# Patient Record
Sex: Male | Born: 1950
Health system: Southern US, Community
[De-identification: ages and names within clinical notes are randomized; demographics above are authoritative.]

## PROBLEM LIST (undated history)

## (undated) DIAGNOSIS — I739 Peripheral vascular disease, unspecified: Secondary | ICD-10-CM

## (undated) DIAGNOSIS — M1990 Unspecified osteoarthritis, unspecified site: Secondary | ICD-10-CM

## (undated) DIAGNOSIS — J690 Pneumonitis due to inhalation of food and vomit: Secondary | ICD-10-CM

## (undated) DIAGNOSIS — K219 Gastro-esophageal reflux disease without esophagitis: Secondary | ICD-10-CM

## (undated) DIAGNOSIS — J189 Pneumonia, unspecified organism: Secondary | ICD-10-CM

## (undated) DIAGNOSIS — E785 Hyperlipidemia, unspecified: Secondary | ICD-10-CM

## (undated) DIAGNOSIS — K759 Inflammatory liver disease, unspecified: Secondary | ICD-10-CM

## (undated) DIAGNOSIS — I10 Essential (primary) hypertension: Secondary | ICD-10-CM

## (undated) DIAGNOSIS — G4730 Sleep apnea, unspecified: Secondary | ICD-10-CM

## (undated) DIAGNOSIS — K851 Biliary acute pancreatitis without necrosis or infection: Secondary | ICD-10-CM

## (undated) HISTORY — DX: Biliary acute pancreatitis without necrosis or infection: K85.10

## (undated) HISTORY — PX: TUBAL LIGATION: SHX77

## (undated) HISTORY — DX: Unspecified osteoarthritis, unspecified site: M19.90

## (undated) HISTORY — PX: ELBOW SURGERY: SHX618

## (undated) HISTORY — DX: Essential (primary) hypertension: I10

## (undated) HISTORY — PX: COLON SURGERY: SHX602

## (undated) HISTORY — DX: Sleep apnea, unspecified: G47.30

## (undated) HISTORY — DX: Pneumonitis due to inhalation of food and vomit: J69.0

## (undated) HISTORY — DX: Inflammatory liver disease, unspecified: K75.9

## (undated) HISTORY — PX: BACK SURGERY: SHX140

## (undated) HISTORY — DX: Peripheral vascular disease, unspecified: I73.9

## (undated) HISTORY — PX: CHOLECYSTECTOMY: SHX55

## (undated) HISTORY — DX: Hyperlipidemia, unspecified: E78.5

## (undated) HISTORY — DX: Pneumonia, unspecified organism: J18.9

## (undated) HISTORY — DX: Gastro-esophageal reflux disease without esophagitis: K21.9

## (undated) HISTORY — PX: SHOULDER SURGERY: SHX246

---

## 1999-04-23 ENCOUNTER — Ambulatory Visit (HOSPITAL_COMMUNITY): Admission: RE | Admit: 1999-04-23 | Discharge: 1999-04-23 | Payer: Self-pay | Admitting: Neurosurgery

## 1999-04-23 ENCOUNTER — Encounter: Payer: Self-pay | Admitting: Neurosurgery

## 1999-06-14 ENCOUNTER — Emergency Department (HOSPITAL_COMMUNITY): Admission: EM | Admit: 1999-06-14 | Discharge: 1999-06-14 | Payer: Self-pay | Admitting: Emergency Medicine

## 2000-02-06 ENCOUNTER — Encounter: Payer: Self-pay | Admitting: Neurosurgery

## 2000-02-11 ENCOUNTER — Inpatient Hospital Stay (HOSPITAL_COMMUNITY): Admission: RE | Admit: 2000-02-11 | Discharge: 2000-02-13 | Payer: Self-pay | Admitting: Neurosurgery

## 2000-02-11 ENCOUNTER — Encounter: Payer: Self-pay | Admitting: Neurosurgery

## 2000-03-30 ENCOUNTER — Ambulatory Visit (HOSPITAL_COMMUNITY): Admission: RE | Admit: 2000-03-30 | Discharge: 2000-03-30 | Payer: Self-pay | Admitting: Neurosurgery

## 2000-03-30 ENCOUNTER — Encounter: Payer: Self-pay | Admitting: Neurosurgery

## 2000-06-03 ENCOUNTER — Ambulatory Visit (HOSPITAL_COMMUNITY): Admission: RE | Admit: 2000-06-03 | Discharge: 2000-06-03 | Payer: Self-pay | Admitting: Neurosurgery

## 2000-06-03 ENCOUNTER — Encounter: Payer: Self-pay | Admitting: Neurosurgery

## 2000-06-11 ENCOUNTER — Encounter: Payer: Self-pay | Admitting: Neurosurgery

## 2000-06-11 ENCOUNTER — Ambulatory Visit (HOSPITAL_COMMUNITY): Admission: RE | Admit: 2000-06-11 | Discharge: 2000-06-11 | Payer: Self-pay | Admitting: Neurosurgery

## 2000-06-24 ENCOUNTER — Encounter: Admission: RE | Admit: 2000-06-24 | Discharge: 2000-06-24 | Payer: Self-pay | Admitting: Neurosurgery

## 2000-06-24 ENCOUNTER — Encounter: Payer: Self-pay | Admitting: Neurosurgery

## 2000-08-04 ENCOUNTER — Encounter: Payer: Self-pay | Admitting: Rheumatology

## 2000-08-04 ENCOUNTER — Encounter: Admission: RE | Admit: 2000-08-04 | Discharge: 2000-08-04 | Payer: Self-pay | Admitting: Rheumatology

## 2000-08-14 ENCOUNTER — Encounter: Admission: RE | Admit: 2000-08-14 | Discharge: 2000-11-12 | Payer: Self-pay | Admitting: Anesthesiology

## 2000-11-10 ENCOUNTER — Encounter: Payer: Self-pay | Admitting: Family Medicine

## 2002-02-04 ENCOUNTER — Encounter: Payer: Self-pay | Admitting: Neurosurgery

## 2002-02-04 ENCOUNTER — Ambulatory Visit (HOSPITAL_COMMUNITY): Admission: RE | Admit: 2002-02-04 | Discharge: 2002-02-04 | Payer: Self-pay | Admitting: Neurosurgery

## 2002-02-11 ENCOUNTER — Encounter: Admission: RE | Admit: 2002-02-11 | Discharge: 2002-02-11 | Payer: Self-pay | Admitting: Neurosurgery

## 2002-02-11 ENCOUNTER — Encounter: Payer: Self-pay | Admitting: Neurosurgery

## 2002-03-01 ENCOUNTER — Encounter: Payer: Self-pay | Admitting: Neurosurgery

## 2002-03-02 ENCOUNTER — Inpatient Hospital Stay (HOSPITAL_COMMUNITY): Admission: RE | Admit: 2002-03-02 | Discharge: 2002-03-04 | Payer: Self-pay | Admitting: Neurosurgery

## 2002-03-02 ENCOUNTER — Encounter: Payer: Self-pay | Admitting: Neurosurgery

## 2002-07-11 ENCOUNTER — Encounter: Admission: RE | Admit: 2002-07-11 | Discharge: 2002-07-11 | Payer: Self-pay | Admitting: Neurosurgery

## 2002-07-11 ENCOUNTER — Encounter: Payer: Self-pay | Admitting: Neurosurgery

## 2002-12-11 ENCOUNTER — Encounter: Payer: Self-pay | Admitting: Family Medicine

## 2002-12-11 ENCOUNTER — Encounter: Admission: RE | Admit: 2002-12-11 | Discharge: 2002-12-11 | Payer: Self-pay | Admitting: Family Medicine

## 2003-04-12 ENCOUNTER — Encounter: Payer: Self-pay | Admitting: Family Medicine

## 2003-06-30 ENCOUNTER — Encounter: Admission: RE | Admit: 2003-06-30 | Discharge: 2003-06-30 | Payer: Self-pay | Admitting: Neurosurgery

## 2003-10-21 ENCOUNTER — Encounter: Payer: Self-pay | Admitting: Family Medicine

## 2004-06-10 ENCOUNTER — Ambulatory Visit (HOSPITAL_COMMUNITY): Admission: RE | Admit: 2004-06-10 | Discharge: 2004-06-10 | Payer: Self-pay | Admitting: Orthopedic Surgery

## 2005-12-13 ENCOUNTER — Encounter: Payer: Self-pay | Admitting: Family Medicine

## 2006-10-07 ENCOUNTER — Emergency Department (HOSPITAL_COMMUNITY): Admission: EM | Admit: 2006-10-07 | Discharge: 2006-10-07 | Payer: Self-pay | Admitting: Emergency Medicine

## 2007-05-13 ENCOUNTER — Encounter: Payer: Self-pay | Admitting: Family Medicine

## 2008-01-25 ENCOUNTER — Encounter: Payer: Self-pay | Admitting: Family Medicine

## 2008-01-25 ENCOUNTER — Encounter: Admission: RE | Admit: 2008-01-25 | Discharge: 2008-01-25 | Payer: Self-pay | Admitting: Anesthesiology

## 2008-08-28 ENCOUNTER — Inpatient Hospital Stay (HOSPITAL_COMMUNITY): Admission: EM | Admit: 2008-08-28 | Discharge: 2008-09-17 | Payer: Self-pay | Admitting: Emergency Medicine

## 2008-08-28 ENCOUNTER — Ambulatory Visit: Payer: Self-pay | Admitting: Internal Medicine

## 2008-08-31 ENCOUNTER — Encounter (INDEPENDENT_AMBULATORY_CARE_PROVIDER_SITE_OTHER): Payer: Self-pay | Admitting: Internal Medicine

## 2008-09-04 ENCOUNTER — Encounter (INDEPENDENT_AMBULATORY_CARE_PROVIDER_SITE_OTHER): Payer: Self-pay | Admitting: Internal Medicine

## 2008-09-04 ENCOUNTER — Ambulatory Visit: Payer: Self-pay | Admitting: *Deleted

## 2008-09-29 ENCOUNTER — Ambulatory Visit: Payer: Self-pay | Admitting: Family Medicine

## 2008-09-29 DIAGNOSIS — K859 Acute pancreatitis without necrosis or infection, unspecified: Secondary | ICD-10-CM | POA: Insufficient documentation

## 2008-09-29 DIAGNOSIS — B356 Tinea cruris: Secondary | ICD-10-CM | POA: Insufficient documentation

## 2008-09-29 DIAGNOSIS — D509 Iron deficiency anemia, unspecified: Secondary | ICD-10-CM | POA: Insufficient documentation

## 2008-09-29 DIAGNOSIS — Z862 Personal history of diseases of the blood and blood-forming organs and certain disorders involving the immune mechanism: Secondary | ICD-10-CM | POA: Insufficient documentation

## 2008-09-29 DIAGNOSIS — I10 Essential (primary) hypertension: Secondary | ICD-10-CM | POA: Insufficient documentation

## 2008-10-02 ENCOUNTER — Encounter: Payer: Self-pay | Admitting: Family Medicine

## 2008-10-02 LAB — CONVERTED CEMR LAB
AST: 11 units/L (ref 0–37)
BUN: 15 mg/dL (ref 6–23)
Basophils Absolute: 0 10*3/uL (ref 0.0–0.1)
Bilirubin, Direct: 0.1 mg/dL (ref 0.0–0.3)
Eosinophils Relative: 0 % (ref 0–5)
Lymphocytes Relative: 17 % (ref 12–46)
Neutro Abs: 10.5 10*3/uL — ABNORMAL HIGH (ref 1.7–7.7)
Platelets: 435 10*3/uL — ABNORMAL HIGH (ref 150–400)
Potassium: 4 meq/L (ref 3.5–5.3)
RDW: 13.2 % (ref 11.5–15.5)
Sodium: 138 meq/L (ref 135–145)
Total Bilirubin: 0.4 mg/dL (ref 0.3–1.2)

## 2008-10-10 ENCOUNTER — Telehealth: Payer: Self-pay | Admitting: Family Medicine

## 2008-10-18 ENCOUNTER — Encounter: Payer: Self-pay | Admitting: Family Medicine

## 2008-10-30 ENCOUNTER — Ambulatory Visit: Payer: Self-pay | Admitting: Family Medicine

## 2008-10-30 DIAGNOSIS — K5900 Constipation, unspecified: Secondary | ICD-10-CM | POA: Insufficient documentation

## 2008-10-30 LAB — CONVERTED CEMR LAB
Basophils Relative: 0 % (ref 0.0–3.0)
Eosinophils Relative: 0.7 % (ref 0.0–5.0)
HCT: 37.4 % — ABNORMAL LOW (ref 39.0–52.0)
Hgb A1c MFr Bld: 7.4 % — ABNORMAL HIGH (ref 4.6–6.5)
Lymphs Abs: 2.5 10*3/uL (ref 0.7–4.0)
MCV: 84.9 fL (ref 78.0–100.0)
Monocytes Absolute: 0.6 10*3/uL (ref 0.1–1.0)
Neutro Abs: 5.7 10*3/uL (ref 1.4–7.7)
Platelets: 329 10*3/uL (ref 150.0–400.0)
WBC: 8.9 10*3/uL (ref 4.5–10.5)

## 2009-01-26 ENCOUNTER — Ambulatory Visit: Payer: Self-pay | Admitting: Family Medicine

## 2009-01-29 LAB — CONVERTED CEMR LAB
Direct LDL: 130.8 mg/dL
Hgb A1c MFr Bld: 7.4 % — ABNORMAL HIGH (ref 4.6–6.5)
Total CHOL/HDL Ratio: 4
VLDL: 49 mg/dL — ABNORMAL HIGH (ref 0.0–40.0)

## 2009-04-24 ENCOUNTER — Ambulatory Visit: Payer: Self-pay | Admitting: Family Medicine

## 2009-04-24 DIAGNOSIS — E785 Hyperlipidemia, unspecified: Secondary | ICD-10-CM | POA: Insufficient documentation

## 2009-04-24 LAB — CONVERTED CEMR LAB: Cholesterol, target level: 200 mg/dL

## 2009-06-14 ENCOUNTER — Ambulatory Visit: Payer: Self-pay | Admitting: Family Medicine

## 2009-06-15 LAB — CONVERTED CEMR LAB
AST: 18 units/L (ref 0–37)
Alkaline Phosphatase: 71 units/L (ref 39–117)
Creatinine,U: 120.6 mg/dL
Direct LDL: 113.3 mg/dL
Microalb Creat Ratio: 3.3 mg/g (ref 0.0–30.0)
Total Bilirubin: 0.8 mg/dL (ref 0.3–1.2)
Total CHOL/HDL Ratio: 4

## 2009-06-22 ENCOUNTER — Ambulatory Visit: Payer: Self-pay | Admitting: Family Medicine

## 2009-08-08 ENCOUNTER — Telehealth: Payer: Self-pay | Admitting: Family Medicine

## 2009-08-09 ENCOUNTER — Ambulatory Visit (HOSPITAL_BASED_OUTPATIENT_CLINIC_OR_DEPARTMENT_OTHER): Admission: RE | Admit: 2009-08-09 | Discharge: 2009-08-09 | Payer: Self-pay | Admitting: Orthopedic Surgery

## 2009-08-17 ENCOUNTER — Telehealth: Payer: Self-pay | Admitting: Family Medicine

## 2009-09-14 ENCOUNTER — Ambulatory Visit: Payer: Self-pay | Admitting: Family Medicine

## 2009-09-19 LAB — CONVERTED CEMR LAB
Albumin: 4.9 g/dL (ref 3.5–5.2)
Alkaline Phosphatase: 83 units/L (ref 39–117)
Bilirubin, Direct: 0.2 mg/dL (ref 0.0–0.3)
Direct LDL: 108.7 mg/dL
HDL: 48.1 mg/dL (ref >39.00)
Hgb A1c MFr Bld: 7.7 % — ABNORMAL HIGH (ref 4.6–6.5)
Triglycerides: 241 mg/dL — ABNORMAL HIGH (ref 0.0–149.0)
VLDL: 48.2 mg/dL — ABNORMAL HIGH (ref 0.0–40.0)

## 2009-09-21 ENCOUNTER — Ambulatory Visit: Payer: Self-pay | Admitting: Family Medicine

## 2009-09-21 DIAGNOSIS — M25559 Pain in unspecified hip: Secondary | ICD-10-CM | POA: Insufficient documentation

## 2009-09-28 ENCOUNTER — Encounter: Payer: Self-pay | Admitting: Family Medicine

## 2009-10-02 ENCOUNTER — Ambulatory Visit: Payer: Self-pay | Admitting: Family Medicine

## 2009-12-17 ENCOUNTER — Ambulatory Visit: Payer: Self-pay | Admitting: Family Medicine

## 2010-01-02 ENCOUNTER — Ambulatory Visit: Payer: Self-pay | Admitting: Family Medicine

## 2010-01-20 ENCOUNTER — Inpatient Hospital Stay (HOSPITAL_COMMUNITY): Admission: EM | Admit: 2010-01-20 | Discharge: 2010-01-26 | Payer: Self-pay | Admitting: Emergency Medicine

## 2010-01-20 ENCOUNTER — Ambulatory Visit: Payer: Self-pay | Admitting: Gastroenterology

## 2010-01-23 ENCOUNTER — Telehealth: Payer: Self-pay | Admitting: Family Medicine

## 2010-01-24 ENCOUNTER — Encounter (INDEPENDENT_AMBULATORY_CARE_PROVIDER_SITE_OTHER): Payer: Self-pay | Admitting: Internal Medicine

## 2010-01-31 ENCOUNTER — Ambulatory Visit: Payer: Self-pay | Admitting: Family Medicine

## 2010-01-31 DIAGNOSIS — K869 Disease of pancreas, unspecified: Secondary | ICD-10-CM | POA: Insufficient documentation

## 2010-03-04 ENCOUNTER — Telehealth: Payer: Self-pay | Admitting: Family Medicine

## 2010-03-27 ENCOUNTER — Other Ambulatory Visit: Payer: Self-pay | Admitting: Family Medicine

## 2010-03-27 ENCOUNTER — Ambulatory Visit
Admission: RE | Admit: 2010-03-27 | Discharge: 2010-03-27 | Payer: Self-pay | Source: Home / Self Care | Attending: Family Medicine | Admitting: Family Medicine

## 2010-03-27 LAB — HEMOGLOBIN A1C: Hgb A1c MFr Bld: 6.4 % (ref 4.6–6.5)

## 2010-04-10 ENCOUNTER — Ambulatory Visit
Admission: RE | Admit: 2010-04-10 | Discharge: 2010-04-10 | Payer: Self-pay | Source: Home / Self Care | Attending: Family Medicine | Admitting: Family Medicine

## 2010-04-10 DIAGNOSIS — N529 Male erectile dysfunction, unspecified: Secondary | ICD-10-CM | POA: Insufficient documentation

## 2010-04-14 ENCOUNTER — Encounter: Payer: Self-pay | Admitting: Anesthesiology

## 2010-04-23 NOTE — Assessment & Plan Note (Signed)
Summary: 3 MTH F/U // RS   Vital Signs:  Patient profile:   60 year old male Weight:      247 pounds Temp:     98.7 degrees F oral BP sitting:   142 / 80  (left arm) Cuff size:   large  Vitals Entered By: Sid Falcon LPN (April 24, 2009 8:39 AM) CC: 3 month follow-up, Hypertension Management, Lipid Management   History of Present Illness: Follow multiple medical problems.  Type 2 diabetes on Lantus and NovoLog insulin. No hypoglycemic symptoms. Fasting blood sugar stable. Postprandials usually about 200. Recent A1c 7.4%. Has had some weight gain over the holidays. Overall feels well.  Fastings low 100s.  Hyperlipidemia with recent LDL 130. Patient denies prior statin therapy.  Hypertension treated with lisinopril and metoprolol. Controlled home readings.  Type 1 diabetes mellitus follow-up      This is a 60 year old man who presents with Type 2 diabetes mellitus follow-up.  The patient complains of weight gain, but denies polyuria, polydipsia, blurred vision, self managed hypoglycemia, hypoglycemia requiring help, weight loss, and numbness of extremities.  Since the last visit the patient reports poor dietary compliance, compliance with medications, not exercising regularly, and monitoring blood glucose.    Diabetes Management History:      He has not been enrolled in the "Diabetic Education Program".  He states understanding of dietary principles and is following his diet appropriately.  No sensory loss is reported.  Self foot exams are not being performed.  He is checking home blood sugars.  He says that he is not exercising regularly.        Hypoglycemic symptoms are not occurring.  No hyperglycemic symptoms are reported.    Hypertension History:      He denies headache, chest pain, palpitations, dyspnea with exertion, orthopnea, PND, peripheral edema, visual symptoms, neurologic problems, syncope, and side effects from treatment.  He notes no problems with any  antihypertensive medication side effects.        Positive major cardiovascular risk factors include male age 22 years old or older, diabetes, hyperlipidemia, and hypertension.  Negative major cardiovascular risk factors include negative family history for ischemic heart disease and non-tobacco-user status.        Further assessment for target organ damage reveals no history of ASHD, stroke/TIA, or peripheral vascular disease.    Lipid Management History:      Positive NCEP/ATP III risk factors include male age 75 years old or older, diabetes, and hypertension.  Negative NCEP/ATP III risk factors include no family history for ischemic heart disease, non-tobacco-user status, no ASHD (atherosclerotic heart disease), no prior stroke/TIA, no peripheral vascular disease, and no history of aortic aneurysm.       Preventive Screening-Counseling & Management  Alcohol-Tobacco     Smoking Status: never  Caffeine-Diet-Exercise     Does Patient Exercise: no  Allergies: 1)  ! Cortisone Acetate (Cortisone Acetate)  Past History:  Past Medical History: Last updated: 09/29/2008 Acute sever pancreatitis Type ll diabetes, uncontrolled Anemia Hypertension Leukocytosis Sinus tachycardia Brief atrial fibrillation hospital aquired pneumonia chronic back pain post acute hypoxic respiratory failure  Social History: Last updated: 10/30/2008 Married Alcohol use-no  Social History: Smoking Status:  never Does Patient Exercise:  no  Review of Systems       The patient complains of weight gain.  The patient denies anorexia, fever, weight loss, vision loss, chest pain, syncope, dyspnea on exertion, peripheral edema, prolonged cough, headaches, hemoptysis, abdominal pain,  melena, hematochezia, and severe indigestion/heartburn.    Physical Exam  General:  Well-developed,well-nourished,in no acute distress; alert,appropriate and cooperative throughout examination Eyes:  No corneal or conjunctival  inflammation noted. EOMI. Perrla. Funduscopic exam benign, without hemorrhages, exudates or papilledema. Vision grossly normal. Ears:  External ear exam shows no significant lesions or deformities.  Otoscopic examination reveals clear canals, tympanic membranes are intact bilaterally without bulging, retraction, inflammation or discharge. Hearing is grossly normal bilaterally. Mouth:  Oral mucosa and oropharynx without lesions or exudates.  Teeth in good repair. Neck:  No deformities, masses, or tenderness noted. Lungs:  Normal respiratory effort, chest expands symmetrically. Lungs are clear to auscultation, no crackles or wheezes. Heart:  Normal rate and regular rhythm. S1 and S2 normal without gallop, murmur, click, rub or other extra sounds. Abdomen:  soft and non-tender.   Extremities:  no edema.  see foot exam  Diabetes Management Exam:    Foot Exam (with socks and/or shoes not present):       Sensory-Pinprick/Light touch:          Left medial foot (L-4): normal          Left dorsal foot (L-5): normal          Left lateral foot (S-1): normal          Right medial foot (L-4): normal          Right dorsal foot (L-5): normal          Right lateral foot (S-1): normal       Sensory-Monofilament:          Left foot: normal          Right foot: normal       Inspection:          Left foot: normal          Right foot: normal       Nails:          Left foot: fungal infection          Right foot: fungal infection    Eye Exam:       Eye Exam done elsewhere          Date: 11/09          Results: normal   Impression & Recommendations:  Problem # 1:  DIABETES MELLITUS, TYPE II, UNCONTROLLED (ICD-250.02) work on weight loss.  better dietary compliance.  Recheck A1C at f/u. His updated medication list for this problem includes:    Lisinopril 40 Mg Tabs (Lisinopril) ..... Once daily    Lantus 100 Unit/ml Soln (Insulin glargine) .Marland KitchenMarland KitchenMarland KitchenMarland Kitchen 40 units subcutaneousely every 12 hours    Novolog 100  Unit/ml Soln (Insulin aspart) .Marland KitchenMarland KitchenMarland KitchenMarland Kitchen 3 units three times a day, 151-200, 4 units, 201-250, 6 units, 251-300, 8 units, 301-350, 10 units,351-400, 12 units  9 vials for 90 days on average  Problem # 2:  HYPERLIPIDEMIA (ICD-272.4) not controlled.  Start statin (no hx of intolerance) and f/u 2 months to recheck. His updated medication list for this problem includes:    Simvastatin 20 Mg Tabs (Simvastatin) ..... One by mouth q hs  Problem # 3:  HYPERTENSION (ICD-401.9) not to goal  pt to work on weight loss and at f/u if not to goal add additional medications. His updated medication list for this problem includes:    Lisinopril 40 Mg Tabs (Lisinopril) ..... Once daily    Metoprolol Tartrate 100 Mg Tabs (Metoprolol tartrate) ..... Once daily  Complete Medication  List: 1)  Lisinopril 40 Mg Tabs (Lisinopril) .... Once daily 2)  Metoprolol Tartrate 100 Mg Tabs (Metoprolol tartrate) .... Once daily 3)  Lantus 100 Unit/ml Soln (Insulin glargine) .... 40 units subcutaneousely every 12 hours 4)  Novolog 100 Unit/ml Soln (Insulin aspart) .... 3 units three times a day, 151-200, 4 units, 201-250, 6 units, 251-300, 8 units, 301-350, 10 units,351-400, 12 units  9 vials for 90 days on average 5)  Ketoconazole 2 % Crea (Ketoconazole) .... Apply to affected rash two times a day 6)  Onetouch Ultra Test Strp (Glucose blood) .... Test as directed 5-6 times daily 7)  Bd Insulin Syringe Ultrafine 31g X 5/16" 0.5 Ml Misc (Insulin syringe-needle u-100) .... Use as directed 8)  Omeprazole 40 Mg Cpdr (Omeprazole) .... Once daily 9)  Morphine Sulfate Cr 60 Mg Xr12h-tab (Morphine sulfate) .... Bid 10)  Hydrocodone-acetaminophen 10-325 Mg Tabs (Hydrocodone-acetaminophen) .... Every 6 hours as needed for pain 11)  Lidoderm 5 % Ptch (Lidocaine) .... As directed 12)  Simvastatin 20 Mg Tabs (Simvastatin) .... One by mouth q hs  Diabetes Management Assessment/Plan:      The following lipid goals have been established for the  patient: Total cholesterol goal of 200; LDL cholesterol goal of 100; HDL cholesterol goal of 40; Triglyceride goal of 150.    Hypertension Assessment/Plan:      The patient's hypertensive risk group is category C: Target organ damage and/or diabetes.  Today's blood pressure is 142/80.    Lipid Assessment/Plan:      Based on NCEP/ATP III, the patient's risk factor category is "history of diabetes".  The patient's lipid goals are as follows: Total cholesterol goal is 200; LDL cholesterol goal is 100; HDL cholesterol goal is 40; Triglyceride goal is 150.    Patient Instructions: 1)  Please schedule a follow-up appointment in 2 months.  2)  Check your blood sugars regularly. If your readings are usually above:  or below 70 you should contact our office.  3)  It is important that your diabetic A1c level is checked every 3 months.  4)  See your eye doctor yearly to check for diabetic eye damage. 5)  Check your feet each night  for sore areas, calluses or signs of infection.  6)  Hepatic Panel prior to visit ICD-9: 272.4 7)  Lipid panel prior to visit ICD-9 : 272.4 8)  HgBA1c prior to visit  ICD-9: 250.02 9)  Urine Microalbumin prior to visit ICD-9 : 250.02 10)  It is important that you exercise reguarly at least 20 minutes 5 times a week. If you develop chest pain, have severe difficulty breathing, or feel very tired, stop exercising immediately and seek medical attention.  11)  You need to lose weight. Consider a lower calorie diet and regular exercise.  Prescriptions: SIMVASTATIN 20 MG TABS (SIMVASTATIN) one by mouth q hs  #30 x 5   Entered and Authorized by:   Evelena Peat MD   Signed by:   Evelena Peat MD on 04/24/2009   Method used:   Electronically to        [facility]  [phone]* (retail)       [address] [place], [place]  [address]       Ph: [phone] or [phone]       Fax: [phone]   RxID:   [id number]

## 2010-04-23 NOTE — Progress Notes (Signed)
Summary: FYI  Phone Note Call from Patient   Caller: Patient Call For: Michael Peat MD Summary of Call: Pt wants Dr. Caryl Never to know that he is having reconstructive surgery on his elbow and forearm tomorrow. Initial call taken by: Lynann Beaver CMA,  Aug 08, 2009 11:56 AM  Follow-up for Phone Call        noted, Follow-up by: Michael Peat MD,  Aug 08, 2009 1:04 PM

## 2010-04-23 NOTE — Assessment & Plan Note (Signed)
Summary: 2 month fup//ccm   Vital Signs:  Patient profile:   60 year old male Height:      76 inches Weight:      254 pounds Temp:     98.2 degrees F oral BP sitting:   138 / 78  (left arm) Cuff size:   large  Vitals Entered By: Sid Falcon LPN (June 22, 1608 8:25 AM) CC: 2 month follow-up, Hypertension Management, Lipid Management Is Patient Diabetic? Yes Did you bring your meter with you today? No   History of Present Illness: Here for medical problem follow up. Recent labs reviewed.  Lipids not quite to goal.   LDL 113. A1C 7.4 %.  Recent  weight gain but no increase in waist size.  More active. Fasting CBGs sometimes below 100.  No signif hypoglycemia.    Diabetes Management History:      He has not been enrolled in the "Diabetic Education Program".  He states understanding of dietary principles and is following his diet appropriately.  No sensory loss is reported.  Self foot exams are being performed.  He is checking home blood sugars.  He says that he is not exercising regularly.        There are no symptoms to suggest diabetic complications.  No changes have been made to his treatment plan since last visit.    Hypertension History:      He denies headache, chest pain, palpitations, dyspnea with exertion, orthopnea, PND, peripheral edema, neurologic problems, syncope, and side effects from treatment.  He notes no problems with any antihypertensive medication side effects.  Further comments include: compliant with hypertension meds.        Positive major cardiovascular risk factors include male age 58 years old or older, diabetes, hyperlipidemia, and hypertension.  Negative major cardiovascular risk factors include negative family history for ischemic heart disease and non-tobacco-user status.        Further assessment for target organ damage reveals no history of ASHD, stroke/TIA, or peripheral vascular disease.    Lipid Management History:      Positive NCEP/ATP III risk  factors include male age 62 years old or older, diabetes, and hypertension.  Negative NCEP/ATP III risk factors include no family history for ischemic heart disease, non-tobacco-user status, no ASHD (atherosclerotic heart disease), no prior stroke/TIA, no peripheral vascular disease, and no history of aortic aneurysm.      Allergies: 1)  ! Cortisone Acetate (Cortisone Acetate)  Past History:  Past Medical History: Last updated: 09/29/2008 Acute sever pancreatitis Type ll diabetes, uncontrolled Anemia Hypertension Leukocytosis Sinus tachycardia Brief atrial fibrillation hospital aquired pneumonia chronic back pain post acute hypoxic respiratory failure PMH reviewed for relevance  Review of Systems      See HPI       The patient complains of weight gain.  The patient denies anorexia, weight loss, vision loss, chest pain, syncope, dyspnea on exertion, peripheral edema, prolonged cough, headaches, hemoptysis, abdominal pain, melena, hematochezia, and severe indigestion/heartburn.    Physical Exam  General:  Well-developed,well-nourished,in no acute distress; alert,appropriate and cooperative throughout examination Eyes:  pupils equal, pupils round, and pupils reactive to light.   Ears:  External ear exam shows no significant lesions or deformities.  Otoscopic examination reveals clear canals, tympanic membranes are intact bilaterally without bulging, retraction, inflammation or discharge. Hearing is grossly normal bilaterally. Mouth:  Oral mucosa and oropharynx without lesions or exudates.  Teeth in good repair. Neck:  No deformities, masses, or tenderness noted. Lungs:  Normal respiratory effort, chest expands symmetrically. Lungs are clear to auscultation, no crackles or wheezes. Heart:  Normal rate and regular rhythm. S1 and S2 normal without gallop, murmur, click, rub or other extra sounds. Extremities:  no edema. Neurologic:  alert & oriented X3, cranial nerves II-XII intact,  and strength normal in all extremities.     Impression & Recommendations:  Problem # 1:  HYPERLIPIDEMIA (ICD-272.4) titrated simvastatin recently and recheck 3 months. His updated medication list for this problem includes:    Simvastatin 40 Mg Tabs (Simvastatin) ..... One tab daily  Problem # 2:  DIABETES MELLITUS, TYPE II, UNCONTROLLED (ICD-250.02) A1C not to goal.  titration of mealtime sliding scale up by 1 unit. His updated medication list for this problem includes:    Lisinopril 40 Mg Tabs (Lisinopril) ..... Once daily    Lantus 100 Unit/ml Soln (Insulin glargine) .Marland KitchenMarland KitchenMarland KitchenMarland Kitchen 40 units subcutaneousely every 12 hours    Novolog 100 Unit/ml Soln (Insulin aspart) .Marland KitchenMarland KitchenMarland KitchenMarland Kitchen 3 units three times a day, 151-200, 4 units, 201-250, 6 units, 251-300, 8 units, 301-350, 10 units,351-400, 12 units  9 vials for 90 days on average  Problem # 3:  HYPERTENSION (ICD-401.9) near goal.  work on weight loss. His updated medication list for this problem includes:    Lisinopril 40 Mg Tabs (Lisinopril) ..... Once daily    Metoprolol Tartrate 100 Mg Tabs (Metoprolol tartrate) ..... Once daily  Complete Medication List: 1)  Lisinopril 40 Mg Tabs (Lisinopril) .... Once daily 2)  Metoprolol Tartrate 100 Mg Tabs (Metoprolol tartrate) .... Once daily 3)  Lantus 100 Unit/ml Soln (Insulin glargine) .... 40 units subcutaneousely every 12 hours 4)  Novolog 100 Unit/ml Soln (Insulin aspart) .... 3 units three times a day, 151-200, 4 units, 201-250, 6 units, 251-300, 8 units, 301-350, 10 units,351-400, 12 units  9 vials for 90 days on average 5)  Ketoconazole 2 % Crea (Ketoconazole) .... Apply to affected rash two times a day 6)  Onetouch Ultra Test Strp (Glucose blood) .... Test as directed 5-6 times daily 7)  Bd Insulin Syringe Ultrafine 31g X 5/16" 0.5 Ml Misc (Insulin syringe-needle u-100) .... Use as directed 8)  Omeprazole 40 Mg Cpdr (Omeprazole) .... Once daily 9)  Morphine Sulfate Cr 60 Mg Xr12h-tab (Morphine sulfate)  .... Bid 10)  Hydrocodone-acetaminophen 10-325 Mg Tabs (Hydrocodone-acetaminophen) .... Every 6 hours as needed for pain 11)  Lidoderm 5 % Ptch (Lidocaine) .... As directed 12)  Simvastatin 40 Mg Tabs (Simvastatin) .... One tab daily  Diabetes Management Assessment/Plan:      The following lipid goals have been established for the patient: Total cholesterol goal of 200; LDL cholesterol goal of 100; HDL cholesterol goal of 40; Triglyceride goal of 150.    Hypertension Assessment/Plan:      The patient's hypertensive risk group is category C: Target organ damage and/or diabetes.  Today's blood pressure is 138/78.    Lipid Assessment/Plan:      Based on NCEP/ATP III, the patient's risk factor category is "history of diabetes".  The patient's lipid goals are as follows: Total cholesterol goal is 200; LDL cholesterol goal is 100; HDL cholesterol goal is 40; Triglyceride goal is 150.    Patient Instructions: 1)  Please schedule a follow-up appointment in 3 months .  2)  Hepatic Panel prior to visit ICD-9: 272.4 3)  Lipid panel prior to visit ICD-9 : 272.4 4)  HgBA1c prior to visit  ICD-9: 250.02 5)  It is important that you exercise reguarly at  least 20 minutes 5 times a week. If you develop chest pain, have severe difficulty breathing, or feel very tired, stop exercising immediately and seek medical attention.  6)  You need to lose weight. Consider a lower calorie diet and regular exercise.  7)  Titrate sliding scale mealtime insulin up by one unit. 8)  See your eye doctor yearly to check for diabetic eye damage.

## 2010-04-23 NOTE — Assessment & Plan Note (Signed)
Summary: 3 month fup//ccm   Vital Signs:  Patient profile:   60 year old male Weight:      250 pounds Temp:     98 degrees F oral BP sitting:   130 / 88  (left arm) Cuff size:   large  Vitals Entered By: Sid Falcon LPN (September 21, 4852 8:25 AM)  History of Present Illness: Glucose relatively stable.  A1C 7.7%. Lantus 40 units two times a day and novolog sliding scale and averaging around 10 units per dose-and using twice daily. No hypoglycemia.  Hx reported L hip dysplasia.  recent increase in hip pain but no recent fall. Sharp pain which is intermittent more anterior some radiation to groin.  Worse with ambulation. Takes chronic pain meds per pain management and they help slightly.  His hip pains have been progressing over several months.  Diabetes Management History:      He has not been enrolled in the "Diabetic Education Program".  He states understanding of dietary principles and is following his diet appropriately.  No sensory loss is reported.  Self foot exams are being performed.  He is checking home blood sugars.  He says that he is not exercising regularly.    Preventive Screening-Counseling & Management  Alcohol-Tobacco     Smoking Status: never  Allergies: 1)  ! Cortisone Acetate (Cortisone Acetate)  Past History:  Past Medical History: Last updated: 09/29/2008 Acute sever pancreatitis Type ll diabetes, uncontrolled Anemia Hypertension Leukocytosis Sinus tachycardia Brief atrial fibrillation hospital aquired pneumonia chronic back pain post acute hypoxic respiratory failure  Social History: Last updated: 09/21/2009 Married Alcohol use-no Never Smoked  Risk Factors: Exercise: no (04/24/2009)  Risk Factors: Smoking Status: never (09/21/2009)  Past Surgical History: R elbow tendon surgery. B rotator cuff 1979 small bowel obstruction 6 lower lumbar back surgeries. L knee arthroscopy.  Family History: Mother and siblings Diabetes Father  CAD 35s.  Social History: Married Alcohol use-no Never Smoked  Review of Systems  The patient denies anorexia, fever, weight loss, chest pain, syncope, prolonged cough, hemoptysis, abdominal pain, melena, hematochezia, and muscle weakness.    Physical Exam  General:  Well-developed,well-nourished,in no acute distress; alert,appropriate and cooperative throughout examination Neck:  No deformities, masses, or tenderness noted. Lungs:  Normal respiratory effort, chest expands symmetrically. Lungs are clear to auscultation, no crackles or wheezes. Heart:  normal rate and regular rhythm.   Extremities:  L hip pain with int and ext rotation.  No lateral tenderness.  No edema.  Much better ROM R hip c/w L. Neurologic:  No focal strength defecits noted. Psych:  normally interactive, good eye contact, not anxious appearing, and not depressed appearing.    Diabetes Management Exam:    Foot Exam (with socks and/or shoes not present):       Sensory-Pinprick/Light touch:          Left medial foot (L-4): normal          Left dorsal foot (L-5): normal          Left lateral foot (S-1): normal          Right medial foot (L-4): normal          Right dorsal foot (L-5): normal          Right lateral foot (S-1): normal       Sensory-Monofilament:          Left foot: normal          Right foot: normal  Inspection:          Left foot: normal          Right foot: normal       Nails:          Left foot: normal          Right foot: normal    Eye Exam:       Eye Exam done elsewhere          Date: 08/29/2009          Results: normal          Done by: [facility]   Impression & Recommendations:  Problem # 1:  HIP PAIN (ICD-719.45) Assessment New x-rays to further assess. His updated medication list for this problem includes:    Morphine Sulfate Cr 60 Mg Xr12h-tab (Morphine sulfate) ..... Bid    Hydrocodone-acetaminophen 10-325 Mg Tabs (Hydrocodone-acetaminophen) ..... Every 6 hours as needed  for pain  Orders: T-Hip Comp Left Min 2-views (73510TC)  Problem # 2:  DIABETES MELLITUS, TYPE II, UNCONTROLLED (ICD-250.02) Assessment: Deteriorated titrate Lantus to 42 units two times a day.   Consider going to three times a day novolog sliding scale.  Repeat A1C in 3 months. His updated medication list for this problem includes:    Lisinopril 40 Mg Tabs (Lisinopril) ..... Once daily    Lantus 100 Unit/ml Soln (Insulin glargine) .Marland KitchenMarland KitchenMarland KitchenMarland Kitchen 40 units subcutaneousely every 12 hours    Novolog 100 Unit/ml Soln (Insulin aspart) .Marland KitchenMarland KitchenMarland KitchenMarland Kitchen 3 units three times a day, 151-200, 4 units, 201-250, 6 units, 251-300, 8 units, 301-350, 10 units,351-400, 12 units  9 vials for 90 days on average  Complete Medication List: 1)  Lisinopril 40 Mg Tabs (Lisinopril) .... Once daily 2)  Metoprolol Tartrate 100 Mg Tabs (Metoprolol tartrate) .... Once daily 3)  Lantus 100 Unit/ml Soln (Insulin glargine) .... 40 units subcutaneousely every 12 hours 4)  Novolog 100 Unit/ml Soln (Insulin aspart) .... 3 units three times a day, 151-200, 4 units, 201-250, 6 units, 251-300, 8 units, 301-350, 10 units,351-400, 12 units  9 vials for 90 days on average 5)  Ketoconazole 2 % Crea (Ketoconazole) .... Apply to affected rash two times a day 6)  Onetouch Ultra Test Strp (Glucose blood) .... Test as directed 5-6 times daily 7)  Bd Insulin Syringe Ultrafine 31g X 5/16" 0.5 Ml Misc (Insulin syringe-needle u-100) .... Use as directed 8)  Omeprazole 40 Mg Cpdr (Omeprazole) .... Once daily 9)  Morphine Sulfate Cr 60 Mg Xr12h-tab (Morphine sulfate) .... Bid 10)  Hydrocodone-acetaminophen 10-325 Mg Tabs (Hydrocodone-acetaminophen) .... Every 6 hours as needed for pain 11)  Lidoderm 5 % Ptch (Lidocaine) .... As directed 12)  Simvastatin 40 Mg Tabs (Simvastatin) .... One tab daily 13)  Ventolin Hfa 108 (90 Base) Mcg/act Aers (Albuterol sulfate) .... 2 puffs every 4 hours as needed cough & wheeze  Diabetes Management Assessment/Plan:      The  following lipid goals have been established for the patient: Total cholesterol goal of 200; LDL cholesterol goal of 100; HDL cholesterol goal of 40; Triglyceride goal of 150.    Patient Instructions: 1)  Increase Lantus by 2 units every 2 to 3 days until fasting sugars consistently below 130.  2)  Continue Novolog sliding scale. 3)  Please schedule a follow-up appointment in 3 months .  4)  Check your blood sugars regularly. If your readings are usually above:  or below 70 you should contact our office.  5)  It is important  that your diabetic A1c level is checked every 3 months.  6)  Check your feet each night  for sore areas, calluses or signs of infection.  7)  HgBA1c prior to visit  ICD-9: 250.02

## 2010-04-23 NOTE — Letter (Signed)
Summary: [facility]  [facility]   Imported By: Maryln Gottron 10/22/2009 12:23:27  _____________________________________________________________________  External Attachment:    Type:   Image     Comment:   External Document

## 2010-04-23 NOTE — Progress Notes (Signed)
Summary: FYI  Phone Note Call from Patient   Caller: Spouse Call For: Evelena Peat MD Summary of Call: Pt is in [place] with Pancreatitis and GB disease. FYI. Initial call taken by: Lynann Beaver CMA AAMA,  January 23, 2010 10:09 AM  Follow-up for Phone Call        noted Follow-up by: Evelena Peat MD,  January 23, 2010 10:26 AM

## 2010-04-23 NOTE — Assessment & Plan Note (Signed)
Summary: 3 MONTH FOLLOW UP/CJR/PT RSC/CJR   Vital Signs:  Patient profile:   60 year old male Weight:      255 pounds Temp:     97.9 degrees F oral BP sitting:   120 / 80  (left arm) Cuff size:   large  Vitals Entered By: Sid Falcon LPN (January 02, 2010 8:32 AM) Is Patient Diabetic? Yes Did you bring your meter with you today? No  Vision Screening:      Vision Comments: 10/2010   History of Present Illness: Followup type 2 diabetes. Patient currently takes Lantus 45 units b.i.d. and NovoLog insulin sliding scale 3 times daily at meals. Recent fasting blood sugars around 113-140. No hypoglycemia. His blood sugars tend to range higher later in the afternoon. Generally has good dietary compliance. Recent A1c 8.1%.  Diabetes Management History:      He has not been enrolled in the "Diabetic Education Program".  He states understanding of dietary principles and is following his diet appropriately.  No sensory loss is reported.  Self foot exams are being performed.  He is checking home blood sugars.  He says that he is not exercising regularly.        Hypoglycemic symptoms are not occurring.  No hyperglycemic symptoms are reported.    Allergies: 1)  ! Cortisone Acetate (Cortisone Acetate)  Past History:  Past Medical History: Last updated: 09/29/2008 Acute sever pancreatitis Type ll diabetes, uncontrolled Anemia Hypertension Leukocytosis Sinus tachycardia Brief atrial fibrillation hospital aquired pneumonia chronic back pain post acute hypoxic respiratory failure PMH reviewed for relevance  Review of Systems      See HPI  Physical Exam  General:  Well-developed,well-nourished,in no acute distress; alert,appropriate and cooperative throughout examination Mouth:  Oral mucosa and oropharynx without lesions or exudates.  Teeth in good repair. Neck:  No deformities, masses, or tenderness noted. Lungs:  Normal respiratory effort, chest expands symmetrically. Lungs are  clear to auscultation, no crackles or wheezes. Heart:  Normal rate and regular rhythm. S1 and S2 normal without gallop, murmur, click, rub or other extra sounds.  Diabetes Management Exam:    Foot Exam (with socks and/or shoes not present):       Sensory-Pinprick/Light touch:          Left medial foot (L-4): diminished          Left dorsal foot (L-5): diminished          Left lateral foot (S-1): diminished          Right medial foot (L-4): normal          Right dorsal foot (L-5): normal          Right lateral foot (S-1): normal       Sensory-Monofilament:          Left foot: diminished          Right foot: normal       Inspection:          Left foot: normal          Right foot: normal       Nails:          Left foot: fungal infection          Right foot: fungal infection    Eye Exam:       Eye Exam done elsewhere          Date: 10/23/2009          Results: normal  Done by: [facility]   Impression & Recommendations:  Problem # 1:  DIABETES MELLITUS, TYPE II, UNCONTROLLED (ICD-250.02) slow titration of Lantus reviewed with patient. Return 3 months for repeat A1c His updated medication list for this problem includes:    Lisinopril 40 Mg Tabs (Lisinopril) ..... Once daily    Lantus 100 Unit/ml Soln (Insulin glargine) .Marland KitchenMarland KitchenMarland KitchenMarland Kitchen 40 units subcutaneousely every 12 hours    Novolog 100 Unit/ml Soln (Insulin aspart) .Marland KitchenMarland KitchenMarland KitchenMarland Kitchen 3 units three times a day, 151-200, 4 units, 201-250, 6 units, 251-300, 8 units, 301-350, 10 units,351-400, 12 units  9 vials for 90 days on average  Complete Medication List: 1)  Lisinopril 40 Mg Tabs (Lisinopril) .... Once daily 2)  Metoprolol Tartrate 100 Mg Tabs (Metoprolol tartrate) .... Once daily 3)  Lantus 100 Unit/ml Soln (Insulin glargine) .... 40 units subcutaneousely every 12 hours 4)  Novolog 100 Unit/ml Soln (Insulin aspart) .... 3 units three times a day, 151-200, 4 units, 201-250, 6 units, 251-300, 8 units, 301-350, 10 units,351-400, 12 units  9 vials  for 90 days on average 5)  Ketoconazole 2 % Crea (Ketoconazole) .... Apply to affected rash two times a day 6)  Onetouch Ultra Test Strp (Glucose blood) .... Test as directed 5-6 times daily 7)  Bd Insulin Syringe Ultrafine 31g X 5/16" 0.5 Ml Misc (Insulin syringe-needle u-100) .... Use as directed 8)  Omeprazole 40 Mg Cpdr (Omeprazole) .... Once daily 9)  Morphine Sulfate Cr 60 Mg Xr12h-tab (Morphine sulfate) .... Bid 10)  Hydrocodone-acetaminophen 10-325 Mg Tabs (Hydrocodone-acetaminophen) .... Every 6 hours as needed for pain 11)  Lidoderm 5 % Ptch (Lidocaine) .... As directed 12)  Simvastatin 40 Mg Tabs (Simvastatin) .... One tab daily 13)  Ventolin Hfa 108 (90 Base) Mcg/act Aers (Albuterol sulfate) .... 2 puffs every 4 hours as needed cough & wheeze  Other Orders: Admin 1st Vaccine (16109) Flu Vaccine 41yrs + (60454)  Diabetes Management Assessment/Plan:      The following lipid goals have been established for the patient: Total cholesterol goal of 200; LDL cholesterol goal of 100; HDL cholesterol goal of 40; Triglyceride goal of 150.    Patient Instructions: 1)  Increase Lantus by 2 units every other day until fasting blood sugars consistently around 120 2)  Please schedule a follow-up appointment in 3 months .  3)  HgBA1c prior to visit  ICD-9: 250.02 4)  Check your blood sugars regularly. If your readings are usually above:  or below 70 you should contact our office.  5)  It is important that your diabetic A1c level is checked every 3 months.  6)  See your eye doctor yearly to check for diabetic eye damage. 7)  Check your feet each night  for sore areas, calluses or signs of infection.    Flu Vaccine Consent Questions     Do you have a history of severe allergic reactions to this vaccine? no    Any prior history of allergic reactions to egg and/or gelatin? no    Do you have a sensitivity to the preservative Thimersol? no    Do you have a past history of Guillan-Barre Syndrome?  no    Do you currently have an acute febrile illness? no    Have you ever had a severe reaction to latex? no    Vaccine information given and explained to patient? yes    Are you currently pregnant? no    Lot Number:[id number]   Exp Date:09/21/2010   Site Given  Left Deltoid  IMbflu

## 2010-04-23 NOTE — Progress Notes (Signed)
Summary: albuterol  Phone Note Call from Patient Call back at Home Phone [phone]   Caller: wife,jean Summary of Call: Requesting Rx for albuterol for trouble breathing.  Has had surgery 5-19 reconstructive elbow. Always has trouble after anesthesia.  History of asthma.  Dr. [place] Callas 2 yr ago & made appt July, 1st they would give him.  No fever.  No congestion.  No wheezing.  Episode Tues & today.  No pain.   Has had albuterol before.  [facility].  Allergic to prednisone & cortisone.   Initial call taken by: Rudy Jew, RN,  Aug 17, 2009 2:19 PM  Follow-up for Phone Call        OK to fill Ventolin MDI once 2 puffs q 4 hours as needed cough and wheeze with no refill. Follow-up by: Evelena Peat MD,  Aug 17, 2009 2:42 PM  Additional Follow-up for Phone Call Additional follow up Details #1::        Phone Call Completed Additional Follow-up by: Rudy Jew, RN,  Aug 17, 2009 2:54 PM    New/Updated Medications: VENTOLIN HFA 108 (90 BASE) MCG/ACT AERS (ALBUTEROL SULFATE) 2 puffs every 4 hours as needed cough & wheeze Prescriptions: VENTOLIN HFA 108 (90 BASE) MCG/ACT AERS (ALBUTEROL SULFATE) 2 puffs every 4 hours as needed cough & wheeze  #1 x 0   Entered by:   Rudy Jew, RN   Authorized by:   Evelena Peat MD   Signed by:   Rudy Jew, RN on 08/17/2009   Method used:   [facility] to        [facility]  [phone]* (retail)       [address] [place], [place]  [address]       Ph: [phone] or [phone]       Fax: [phone]   RxID:   [id number]

## 2010-04-23 NOTE — Assessment & Plan Note (Signed)
Summary: post hosp follow up re: gallbladder surgery and pancreatitis/cjr   Vital Signs:  Patient profile:   60 year old male Weight:      243 pounds Temp:     97.8 degrees F oral BP sitting:   108 / 70  (left arm) Cuff size:   large  Vitals Entered By: Sid Falcon LPN (January 31, 2010 2:16 PM)  History of Present Illness: Patient is seen for hospital followup. He has history of complicated pancreatitis last year. He presented on October 30 with recurrent abdominal pain nausea and vomiting. Elevated lipase and hepatic function panel. Admitted for further evaluation and treatment.  Patient was noted to have gallstone pancreatitis. Cholecystectomy recommended and underwent laparoscopic cholecystectomy on the third without difficulty. Pain and other symptoms slowly improved. Diabetes remained well-controlled. Initial very high blood pressure in emergency room and this came down with time. Patient incidentally had some small tiny nodules pulmonary noted on CT of abdomen and pelvis. Recommend followup in 12 months.  Currently pain is 2-3/10. Tolerating clear broth and  last night some soft foods without difficulty. Blood sugar stable fastings mostly low 100s. Currently basal insulin 25 units Lantus per day. Using rapid acting insulin at meals. No hypoglycemia. No fever.  Allergies: 1)  ! Cortisone Acetate (Cortisone Acetate)  Past History:  Past Surgical History: Last updated: 09/21/2009 R elbow tendon surgery. B rotator cuff 1979 small bowel obstruction 6 lower lumbar back surgeries. L knee arthroscopy.  Family History: Last updated: 09/21/2009 Mother and siblings Diabetes Father CAD 2s.  Social History: Last updated: 09/21/2009 Married Alcohol use-no Never Smoked  Risk Factors: Exercise: no (04/24/2009)  Risk Factors: Smoking Status: never (09/21/2009)  Past Medical History: Acute severe pancreatitis (gallstone) Type ll diabetes,  uncontrolled Anemia Hypertension Leukocytosis Sinus tachycardia Brief atrial fibrillation hospital aquired pneumonia chronic back pain post acute hypoxic respiratory failure  Review of Systems  The patient denies anorexia, fever, chest pain, dyspnea on exertion, peripheral edema, hemoptysis, melena, hematochezia, and severe indigestion/heartburn.    Physical Exam  General:  Well-developed,well-nourished,in no acute distress; alert,appropriate and cooperative throughout examination Head:  Normocephalic and atraumatic without obvious abnormalities. No apparent alopecia or balding. Mouth:  Oral mucosa and oropharynx without lesions or exudates.  Teeth in good repair. Neck:  No deformities, masses, or tenderness noted. Lungs:  Normal respiratory effort, chest expands symmetrically. Lungs are clear to auscultation, no crackles or wheezes. Heart:  normal rate and regular rhythm.   Abdomen:  scars from previous surgery well-healed. Only very minimal midepigastric tenderness. No guarding or rebound. No hepatomegaly noted. Extremities:  No clubbing, cyanosis, edema, or deformity noted with normal full range of motion of all joints.     Impression & Recommendations:  Problem # 1:  GALLSTONE PANCREATITIS (ICD-577.9) Assessment Improved patient symptomatically improved after cholecystectomy  Problem # 2:  DIABETES MELLITUS, TYPE II, UNCONTROLLED (ICD-250.02) discussed slow titration back up of insulin as diet progresses His updated medication list for this problem includes:    Lisinopril 40 Mg Tabs (Lisinopril) ..... Once daily    Lantus 100 Unit/ml Soln (Insulin glargine) .Marland KitchenMarland KitchenMarland KitchenMarland Kitchen 40 units subcutaneousely every 12 hours    Novolog 100 Unit/ml Soln (Insulin aspart) .Marland KitchenMarland KitchenMarland KitchenMarland Kitchen 3 units three times a day, 151-200, 4 units, 201-250, 6 units, 251-300, 8 units, 301-350, 10 units,351-400, 12 units  9 vials for 90 days on average  Problem # 3:  HYPERTENSION (ICD-401.9)  His updated medication list for  this problem includes:    Lisinopril 40 Mg Tabs (  Lisinopril) ..... Once daily    Metoprolol Tartrate 100 Mg Tabs (Metoprolol tartrate) ..... Once daily  Complete Medication List: 1)  Lisinopril 40 Mg Tabs (Lisinopril) .... Once daily 2)  Metoprolol Tartrate 100 Mg Tabs (Metoprolol tartrate) .... Once daily 3)  Lantus 100 Unit/ml Soln (Insulin glargine) .... 40 units subcutaneousely every 12 hours 4)  Novolog 100 Unit/ml Soln (Insulin aspart) .... 3 units three times a day, 151-200, 4 units, 201-250, 6 units, 251-300, 8 units, 301-350, 10 units,351-400, 12 units  9 vials for 90 days on average 5)  Ketoconazole 2 % Crea (Ketoconazole) .... Apply to affected rash two times a day 6)  Onetouch Ultra Test Strp (Glucose blood) .... Test as directed 5-6 times daily 7)  Bd Insulin Syringe Ultrafine 31g X 5/16" 0.5 Ml Misc (Insulin syringe-needle u-100) .... Use as directed 8)  Omeprazole 40 Mg Cpdr (Omeprazole) .... Once daily 9)  Morphine Sulfate Cr 60 Mg Xr12h-tab (Morphine sulfate) .... Bid 10)  Hydrocodone-acetaminophen 10-325 Mg Tabs (Hydrocodone-acetaminophen) .... Every 6 hours as needed for pain 11)  Lidoderm 5 % Ptch (Lidocaine) .... As directed 12)  Simvastatin 40 Mg Tabs (Simvastatin) .... One tab daily 13)  Ventolin Hfa 108 (90 Base) Mcg/act Aers (Albuterol sulfate) .... 2 puffs every 4 hours as needed cough & wheeze Prescriptions: LISINOPRIL 40 MG TABS (LISINOPRIL) once daily  #90 x 3   Entered by:   Sid Falcon LPN   Authorized by:   Evelena Peat MD   Signed by:   Sid Falcon LPN on 29/56/2130   Method used:   Faxed to ...       [facility])       [address] [address]       [place], [place]  [address]       Ph: [phone]       Fax: [phone]   RxID:   [id number]    Orders Added: 1)  Est. Patient Level IV [63875]

## 2010-04-25 NOTE — Assessment & Plan Note (Signed)
Summary: 3 MNTH ROV//SLM/PTS WIFE RSC/CJR   Vital Signs:  Patient profile:   60 year old male Weight:      250 pounds Temp:     98.0 degrees F oral BP sitting:   120 / 78  (right arm) Cuff size:   regular  Vitals Entered By: Duard Brady LPN (April 10, 2010 8:35 AM) CC: 3 mos rov - doing better    fbs 104 Is Patient Diabetic? Yes Did you bring your meter with you today? No   History of Present Illness: Diabetes greatly improved.  A1C 8.1% to 6.4. Occ hypoglycemia.  overall feels well.  No symptoms of hyperglycemia. Still using Novolog three times a day with meals and Lantus 40 units daily.  New problem of ED.  Normal libido. Able to get erection but  unable to maintain.  Hx recurrent pancreatitis with GB removal several months ago and  doing well since then.  No recurrent sxs.    Diabetes Management History:      He has not been enrolled in the "Diabetic Education Program".  He states understanding of dietary principles and is following his diet appropriately.  Self foot exams are being performed.  He is checking home blood sugars.  He says that he is not exercising regularly.        Hypoglycemic symptoms are not occurring.  No hyperglycemic symptoms are reported.        There are no symptoms to suggest diabetic complications.    Allergies: 1)  ! Cortisone Acetate (Cortisone Acetate)  Past History:  Past Medical History: Last updated: 01/31/2010 Acute severe pancreatitis (gallstone) Type ll diabetes, uncontrolled Anemia Hypertension Leukocytosis Sinus tachycardia Brief atrial fibrillation hospital aquired pneumonia chronic back pain post acute hypoxic respiratory failure  Past Surgical History: Last updated: 09/21/2009 R elbow tendon surgery. B rotator cuff 1979 small bowel obstruction 6 lower lumbar back surgeries. L knee arthroscopy.  Family History: Last updated: 09/21/2009 Mother and siblings Diabetes Father CAD 25s.  Social History: Last  updated: 09/21/2009 Married Alcohol use-no Never Smoked  Risk Factors: Exercise: no (04/24/2009)  Risk Factors: Smoking Status: never (09/21/2009) PMH-FH-SH reviewed for relevance  Review of Systems  The patient denies anorexia, fever, chest pain, syncope, dyspnea on exertion, peripheral edema, prolonged cough, headaches, hemoptysis, abdominal pain, melena, hematochezia, and severe indigestion/heartburn.    Physical Exam  General:  Well-developed,well-nourished,in no acute distress; alert,appropriate and cooperative throughout examination Mouth:  Oral mucosa and oropharynx without lesions or exudates.  Teeth in good repair. Neck:  No deformities, masses, or tenderness noted. Lungs:  Normal respiratory effort, chest expands symmetrically. Lungs are clear to auscultation, no crackles or wheezes. Heart:  Normal rate and regular rhythm. S1 and S2 normal without gallop, murmur, click, rub or other extra sounds. Abdomen:  soft, non-tender, normal bowel sounds, no distention, no masses, no hepatomegaly, and no splenomegaly.   Extremities:  No clubbing, cyanosis, edema, or deformity noted with normal full range of motion of all joints.    Diabetes Management Exam:    Foot Exam (with socks and/or shoes not present):       Sensory-Pinprick/Light touch:          Left medial foot (L-4): normal          Left dorsal foot (L-5): normal          Left lateral foot (S-1): normal          Right medial foot (L-4): normal  Right dorsal foot (L-5): normal          Right lateral foot (S-1): normal       Sensory-Monofilament:          Left foot: normal          Right foot: normal       Inspection:          Left foot: normal          Right foot: normal       Nails:          Left foot: normal          Right foot: normal   Impression & Recommendations:  Problem # 1:  DIABETES MELLITUS, TYPE II, UNCONTROLLED (ICD-250.02) Assessment Improved discussed possible reduction in novolog  insulin if sugars remain well controlled. His updated medication list for this problem includes:    Lisinopril 40 Mg Tabs (Lisinopril) ..... Once daily    Lantus 100 Unit/ml Soln (Insulin glargine) .Marland KitchenMarland KitchenMarland KitchenMarland Kitchen 40 units subcutaneousely every 12 hours    Novolog 100 Unit/ml Soln (Insulin aspart) .Marland KitchenMarland KitchenMarland KitchenMarland Kitchen 3 units three times a day, 151-200, 4 units, 201-250, 6 units, 251-300, 8 units, 301-350, 10 units,351-400, 12 units  9 vials for 90 days on average  Problem # 2:  ERECTILE DYSFUNCTION, ORGANIC (ICD-607.84) Assessment: New trial of Cialis with samples given. His updated medication list for this problem includes:    Cialis 20 Mg Tabs (Tadalafil) ..... One by mouth every other day as needed  Complete Medication List: 1)  Lisinopril 40 Mg Tabs (Lisinopril) .... Once daily 2)  Metoprolol Tartrate 100 Mg Tabs (Metoprolol tartrate) .... Once daily 3)  Lantus 100 Unit/ml Soln (Insulin glargine) .... 40 units subcutaneousely every 12 hours 4)  Novolog 100 Unit/ml Soln (Insulin aspart) .... 3 units three times a day, 151-200, 4 units, 201-250, 6 units, 251-300, 8 units, 301-350, 10 units,351-400, 12 units  9 vials for 90 days on average 5)  Ketoconazole 2 % Crea (Ketoconazole) .... Apply to affected rash two times a day 6)  Onetouch Ultra Test Strp (Glucose blood) .... Test as directed 5-6 times daily 7)  Bd Insulin Syringe Ultrafine 31g X 5/16" 0.5 Ml Misc (Insulin syringe-needle u-100) .... Use as directed 8)  Omeprazole 40 Mg Cpdr (Omeprazole) .... Once daily 9)  Morphine Sulfate Cr 60 Mg Xr12h-tab (Morphine sulfate) .... Bid 10)  Hydrocodone-acetaminophen 10-325 Mg Tabs (Hydrocodone-acetaminophen) .... Every 6 hours as needed for pain 11)  Lidoderm 5 % Ptch (Lidocaine) .... As directed 12)  Simvastatin 40 Mg Tabs (Simvastatin) .... One tab daily 13)  Ventolin Hfa 108 (90 Base) Mcg/act Aers (Albuterol sulfate) .... 2 puffs every 4 hours as needed cough & wheeze 14)  Cialis 20 Mg Tabs (Tadalafil) .... One by  mouth every other day as needed  Diabetes Management Assessment/Plan:      The following lipid goals have been established for the patient: Total cholesterol goal of 200; LDL cholesterol goal of 100; HDL cholesterol goal of 40; Triglyceride goal of 150.    Patient Instructions: 1)  Cialis 20 mg one every other day as needed. 2)  Please schedule a follow-up appointment in 3 months .    Orders Added: 1)  Est. Patient Level IV [16109]

## 2010-04-25 NOTE — Progress Notes (Signed)
Summary: REFILL REQUEST Onetouch Test Strips  Phone Note Refill Request Message from:  Patient on March 04, 2010 11:07 AM  Refills Requested: Medication #1:  ONETOUCH ULTRA TEST  STRP test as directed 5-6 times daily   Notes: [facility].     Initial call taken by: Debbra Riding,  March 04, 2010 11:07 AM    Prescriptions: [place] TEST  STRP (GLUCOSE BLOOD) test as directed 5-6 times daily  #300 x 11   Entered by:   Sid Falcon LPN   Authorized by:   Evelena Peat MD   Signed by:   Sid Falcon LPN on 16/12/9602   Method used:   Electronically to        [facility]  [phone]* (retail)       [address] [place], [place]  [address]       Ph: [phone] or [phone]       Fax: [phone]   RxID:   [id number]

## 2010-05-30 ENCOUNTER — Ambulatory Visit (INDEPENDENT_AMBULATORY_CARE_PROVIDER_SITE_OTHER): Payer: BC Managed Care – PPO | Admitting: Family Medicine

## 2010-05-30 ENCOUNTER — Encounter: Payer: Self-pay | Admitting: Family Medicine

## 2010-05-30 DIAGNOSIS — R5383 Other fatigue: Secondary | ICD-10-CM

## 2010-05-30 DIAGNOSIS — Z862 Personal history of diseases of the blood and blood-forming organs and certain disorders involving the immune mechanism: Secondary | ICD-10-CM

## 2010-05-30 DIAGNOSIS — R531 Weakness: Secondary | ICD-10-CM

## 2010-05-30 DIAGNOSIS — IMO0001 Reserved for inherently not codable concepts without codable children: Secondary | ICD-10-CM

## 2010-05-30 DIAGNOSIS — R5381 Other malaise: Secondary | ICD-10-CM

## 2010-05-30 DIAGNOSIS — I10 Essential (primary) hypertension: Secondary | ICD-10-CM

## 2010-05-30 LAB — CBC WITH DIFFERENTIAL/PLATELET
Basophils Absolute: 0 10*3/uL (ref 0.0–0.1)
Basophils Relative: 0.2 % (ref 0.0–3.0)
Eosinophils Absolute: 0.1 10*3/uL (ref 0.0–0.7)
Hemoglobin: 16.8 g/dL (ref 13.0–17.0)
Lymphocytes Relative: 23.5 % (ref 12.0–46.0)
MCHC: 34.8 g/dL (ref 30.0–36.0)
MCV: 90.5 fl (ref 78.0–100.0)
Monocytes Absolute: 0.6 10*3/uL (ref 0.1–1.0)
Neutro Abs: 8.8 10*3/uL — ABNORMAL HIGH (ref 1.4–7.7)
RDW: 12.9 % (ref 11.5–14.6)

## 2010-05-30 LAB — BASIC METABOLIC PANEL
BUN: 18 mg/dL (ref 6–23)
CO2: 26 mEq/L (ref 19–32)
Calcium: 9.2 mg/dL (ref 8.4–10.5)
Creatinine, Ser: 0.9 mg/dL (ref 0.4–1.5)
GFR: 93.51 mL/min (ref >60.00)
Glucose, Bld: 150 mg/dL — ABNORMAL HIGH (ref 70–99)
Sodium: 140 mEq/L (ref 135–145)

## 2010-05-30 LAB — HEPATIC FUNCTION PANEL
Albumin: 4.6 g/dL (ref 3.5–5.2)
Alkaline Phosphatase: 78 U/L (ref 39–117)
Total Protein: 7.1 g/dL (ref 6.0–8.3)

## 2010-05-30 NOTE — Progress Notes (Signed)
  Subjective:    Patient ID: [name], male    DOB: [birth year], 60 y.o.   MRN: [id number]  HPI  Patient seen with nonspecific complaints of weakness and profound fatigue past 5 days. Also describes some shortness of breath but this is at rest and more a sensation of not being able to get a deep breath. No cough or wheezing. Denies chest pain. Has also noticed blood sugars fluctuating somewhat more over the past couple of days ranging 125-230. No symptoms of hyper or hypoglycemia other than some increased thirst.    patient has chronic problems including history of recurrent pancreatitis, past history of anemia , type 2 diabetes , hyperlipidemia , and hypertension. No orthostatic symptoms. States he feels like he did when he was anemic previously. Patient relates yellow discoloration of his eyes couple days ago but less today. Denies any nausea, vomiting, diarrhea , fever, or chills. Appetite is slightly diminished. No weight loss.   does describe some diffuse bodyaches mostly upper extremities. Somewhat dizzy past few days this occurs at rest and not necessarily with standing. No syncope.      Review of Systems  Constitutional: Positive for appetite change and fatigue. Negative for fever, chills and diaphoresis.  HENT: Negative for sore throat, facial swelling, trouble swallowing, neck stiffness and sinus pressure.   Eyes: Negative for visual disturbance.  Respiratory: Negative for cough and wheezing.   Cardiovascular: Negative for chest pain, palpitations and leg swelling.  Gastrointestinal: Negative for nausea, vomiting, abdominal pain, diarrhea, constipation and blood in stool.  Genitourinary: Negative for dysuria.  Musculoskeletal: Positive for back pain and arthralgias.  Skin: Negative for rash.  Neurological: Positive for dizziness. Negative for syncope and headaches.  Hematological: Negative for adenopathy.       Objective:   Physical Exam  patient is alert and  nontoxic in appearance. Blood pressure 130/80. Afebrile  Oropharynx is moist and clear Eyes no scleral icterus. Pupils equal round reactive to light.  Eardrums normal Neck supple no mass Chest clear to auscultation Heart regular rate Abdomen nondistended. Normal bowel sounds. Soft nontender with no mass. No hepatomegaly or splenomegaly  Extremities no edema Neuro exam cranial nerves all intact. No focal strength deficits. Gait normal       Assessment & Plan:   #1 weakness and fatigue of uncertain origin. Obtain screening lab work. No obvious infectious origin.  Doubt polymyalgia rheumatica but check sed rate #2 type 2 diabetes with some recent fluctuation. No hypoglycemia  #3 hypertension stable  #4 past history of gallstone pancreatitis  #5 hyperlipidemia  #6 past history of anemia

## 2010-05-31 NOTE — Progress Notes (Signed)
Quick Note:  Pt informed, no cough or fever, actually feeling better today ______

## 2010-06-05 LAB — LIPASE, BLOOD
Lipase: 143 U/L — ABNORMAL HIGH (ref 11–59)
Lipase: 1722 U/L — ABNORMAL HIGH (ref 11–59)
Lipase: 68 U/L — ABNORMAL HIGH (ref 11–59)

## 2010-06-05 LAB — POCT I-STAT, CHEM 8
Chloride: 101 mEq/L (ref 96–112)
Creatinine, Ser: 1.2 mg/dL (ref 0.4–1.5)
Glucose, Bld: 356 mg/dL — ABNORMAL HIGH (ref 70–99)
HCT: 47 % (ref 39.0–52.0)
Hemoglobin: 16 g/dL (ref 13.0–17.0)
Potassium: 4.5 mEq/L (ref 3.5–5.1)
Sodium: 137 mEq/L (ref 135–145)

## 2010-06-05 LAB — COMPREHENSIVE METABOLIC PANEL
ALT: 151 U/L — ABNORMAL HIGH (ref 0–53)
ALT: 470 U/L — ABNORMAL HIGH (ref 0–53)
ALT: 93 U/L — ABNORMAL HIGH (ref 0–53)
AST: 19 U/L (ref 0–37)
AST: 262 U/L — ABNORMAL HIGH (ref 0–37)
Albumin: 3 g/dL — ABNORMAL LOW (ref 3.5–5.2)
Albumin: 3.6 g/dL (ref 3.5–5.2)
Alkaline Phosphatase: 105 U/L (ref 39–117)
Alkaline Phosphatase: 142 U/L — ABNORMAL HIGH (ref 39–117)
Alkaline Phosphatase: 175 U/L — ABNORMAL HIGH (ref 39–117)
Alkaline Phosphatase: 176 U/L — ABNORMAL HIGH (ref 39–117)
BUN: 13 mg/dL (ref 6–23)
BUN: 14 mg/dL (ref 6–23)
BUN: 15 mg/dL (ref 6–23)
BUN: 16 mg/dL (ref 6–23)
BUN: 19 mg/dL (ref 6–23)
CO2: 22 mEq/L (ref 19–32)
CO2: 23 mEq/L (ref 19–32)
CO2: 25 mEq/L (ref 19–32)
CO2: 27 mEq/L (ref 19–32)
Calcium: 8 mg/dL — ABNORMAL LOW (ref 8.4–10.5)
Calcium: 8.2 mg/dL — ABNORMAL LOW (ref 8.4–10.5)
Calcium: 8.7 mg/dL (ref 8.4–10.5)
Chloride: 102 mEq/L (ref 96–112)
Chloride: 103 mEq/L (ref 96–112)
Chloride: 104 mEq/L (ref 96–112)
Chloride: 104 mEq/L (ref 96–112)
Chloride: 107 mEq/L (ref 96–112)
Creatinine, Ser: 1.02 mg/dL (ref 0.4–1.5)
Creatinine, Ser: 1.11 mg/dL (ref 0.4–1.5)
Creatinine, Ser: 1.15 mg/dL (ref 0.4–1.5)
Creatinine, Ser: 1.17 mg/dL (ref 0.4–1.5)
Creatinine, Ser: 1.19 mg/dL (ref 0.4–1.5)
GFR calc Af Amer: >60 mL/min (ref >60)
GFR calc Af Amer: >60 mL/min (ref >60)
GFR calc non Af Amer: >60 mL/min (ref >60)
GFR calc non Af Amer: >60 mL/min (ref >60)
GFR calc non Af Amer: >60 mL/min (ref >60)
GFR calc non Af Amer: >60 mL/min (ref >60)
Glucose, Bld: 173 mg/dL — ABNORMAL HIGH (ref 70–99)
Glucose, Bld: 179 mg/dL — ABNORMAL HIGH (ref 70–99)
Glucose, Bld: 240 mg/dL — ABNORMAL HIGH (ref 70–99)
Glucose, Bld: 379 mg/dL — ABNORMAL HIGH (ref 70–99)
Potassium: 3.7 mEq/L (ref 3.5–5.1)
Potassium: 3.7 mEq/L (ref 3.5–5.1)
Potassium: 4.2 mEq/L (ref 3.5–5.1)
Potassium: 4.5 mEq/L (ref 3.5–5.1)
Potassium: 4.5 mEq/L (ref 3.5–5.1)
Sodium: 138 mEq/L (ref 135–145)
Sodium: 142 mEq/L (ref 135–145)
Total Bilirubin: 1 mg/dL (ref 0.3–1.2)
Total Bilirubin: 1.4 mg/dL — ABNORMAL HIGH (ref 0.3–1.2)
Total Bilirubin: 1.9 mg/dL — ABNORMAL HIGH (ref 0.3–1.2)
Total Bilirubin: 2.2 mg/dL — ABNORMAL HIGH (ref 0.3–1.2)
Total Bilirubin: 2.7 mg/dL — ABNORMAL HIGH (ref 0.3–1.2)
Total Protein: 6 g/dL (ref 6.0–8.3)
Total Protein: 6.1 g/dL (ref 6.0–8.3)
Total Protein: 6.3 g/dL (ref 6.0–8.3)

## 2010-06-05 LAB — GLUCOSE, CAPILLARY
Glucose-Capillary: 164 mg/dL — ABNORMAL HIGH (ref 70–99)
Glucose-Capillary: 174 mg/dL — ABNORMAL HIGH (ref 70–99)
Glucose-Capillary: 176 mg/dL — ABNORMAL HIGH (ref 70–99)
Glucose-Capillary: 187 mg/dL — ABNORMAL HIGH (ref 70–99)
Glucose-Capillary: 201 mg/dL — ABNORMAL HIGH (ref 70–99)
Glucose-Capillary: 205 mg/dL — ABNORMAL HIGH (ref 70–99)
Glucose-Capillary: 209 mg/dL — ABNORMAL HIGH (ref 70–99)
Glucose-Capillary: 209 mg/dL — ABNORMAL HIGH (ref 70–99)
Glucose-Capillary: 211 mg/dL — ABNORMAL HIGH (ref 70–99)
Glucose-Capillary: 213 mg/dL — ABNORMAL HIGH (ref 70–99)
Glucose-Capillary: 213 mg/dL — ABNORMAL HIGH (ref 70–99)
Glucose-Capillary: 233 mg/dL — ABNORMAL HIGH (ref 70–99)
Glucose-Capillary: 290 mg/dL — ABNORMAL HIGH (ref 70–99)

## 2010-06-05 LAB — LIPID PANEL
LDL Cholesterol: 46 mg/dL (ref 0–99)
Total CHOL/HDL Ratio: 2.2 RATIO
Triglycerides: 95 mg/dL (ref <150)
VLDL: 19 mg/dL (ref 0–40)

## 2010-06-05 LAB — DIFFERENTIAL
Basophils Relative: 0 % (ref 0–1)
Eosinophils Absolute: 0 10*3/uL (ref 0.0–0.7)
Eosinophils Relative: 0 % (ref 0–5)
Lymphs Abs: 1.8 10*3/uL (ref 0.7–4.0)
Neutrophils Relative %: 79 % — ABNORMAL HIGH (ref 43–77)

## 2010-06-05 LAB — URINALYSIS, ROUTINE W REFLEX MICROSCOPIC
Bilirubin Urine: NEGATIVE
Glucose, UA: >1000 mg/dL — AB
Hgb urine dipstick: NEGATIVE
Specific Gravity, Urine: 1.021 (ref 1.005–1.030)
Urobilinogen, UA: 1 mg/dL (ref 0.0–1.0)

## 2010-06-05 LAB — CBC
HCT: 35.7 % — ABNORMAL LOW (ref 39.0–52.0)
HCT: 36.2 % — ABNORMAL LOW (ref 39.0–52.0)
HCT: 36.7 % — ABNORMAL LOW (ref 39.0–52.0)
HCT: 39.2 % (ref 39.0–52.0)
HCT: 45.5 % (ref 39.0–52.0)
Hemoglobin: 12.8 g/dL — ABNORMAL LOW (ref 13.0–17.0)
Hemoglobin: 13.1 g/dL (ref 13.0–17.0)
Hemoglobin: 16 g/dL (ref 13.0–17.0)
MCH: 30.9 pg (ref 26.0–34.0)
MCH: 31.1 pg (ref 26.0–34.0)
MCH: 31.1 pg (ref 26.0–34.0)
MCH: 31.2 pg (ref 26.0–34.0)
MCHC: 34.9 g/dL (ref 30.0–36.0)
MCHC: 35.3 g/dL (ref 30.0–36.0)
MCHC: 35.8 g/dL (ref 30.0–36.0)
MCV: 87.1 fL (ref 78.0–100.0)
MCV: 87.6 fL (ref 78.0–100.0)
MCV: 88.7 fL (ref 78.0–100.0)
MCV: 88.8 fL (ref 78.0–100.0)
Platelets: 154 10*3/uL (ref 150–400)
Platelets: 230 10*3/uL (ref 150–400)
RBC: 4.25 MIL/uL (ref 4.22–5.81)
RBC: 4.39 MIL/uL (ref 4.22–5.81)
RBC: 5.16 MIL/uL (ref 4.22–5.81)
RDW: 12.4 % (ref 11.5–15.5)
RDW: 12.4 % (ref 11.5–15.5)
RDW: 12.5 % (ref 11.5–15.5)
RDW: 12.9 % (ref 11.5–15.5)
WBC: 11.5 10*3/uL — ABNORMAL HIGH (ref 4.0–10.5)
WBC: 12 10*3/uL — ABNORMAL HIGH (ref 4.0–10.5)
WBC: 14.1 10*3/uL — ABNORMAL HIGH (ref 4.0–10.5)
WBC: 7.8 10*3/uL (ref 4.0–10.5)
WBC: 8.8 10*3/uL (ref 4.0–10.5)

## 2010-06-05 LAB — RAPID URINE DRUG SCREEN, HOSP PERFORMED
Barbiturates: NOT DETECTED
Benzodiazepines: NOT DETECTED

## 2010-06-05 LAB — ETHANOL: Alcohol, Ethyl (B): 8 mg/dL (ref 0–10)

## 2010-06-05 LAB — BILIRUBIN, DIRECT: Bilirubin, Direct: 0.7 mg/dL — ABNORMAL HIGH (ref 0.0–0.3)

## 2010-06-05 LAB — LACTATE DEHYDROGENASE: LDH: 155 U/L (ref 94–250)

## 2010-06-05 LAB — AMYLASE: Amylase: 62 U/L (ref 0–105)

## 2010-06-05 LAB — URINE MICROSCOPIC-ADD ON: Urine-Other: NONE SEEN

## 2010-06-05 LAB — PROTIME-INR
INR: 1.12 (ref 0.00–1.49)
Prothrombin Time: 14.6 seconds (ref 11.6–15.2)

## 2010-06-10 LAB — COMPREHENSIVE METABOLIC PANEL
ALT: 20 U/L (ref 0–53)
AST: 20 U/L (ref 0–37)
Albumin: 4 g/dL (ref 3.5–5.2)
Alkaline Phosphatase: 76 U/L (ref 39–117)
Chloride: 103 mEq/L (ref 96–112)
GFR calc Af Amer: >60 mL/min (ref >60)
Potassium: 4.9 mEq/L (ref 3.5–5.1)
Total Bilirubin: 0.8 mg/dL (ref 0.3–1.2)

## 2010-07-01 LAB — BLOOD GAS, ARTERIAL
Acid-base deficit: 10.4 mmol/L — ABNORMAL HIGH (ref 0.0–2.0)
Acid-base deficit: 11.1 mmol/L — ABNORMAL HIGH (ref 0.0–2.0)
Bicarbonate: 12 mEq/L — ABNORMAL LOW (ref 20.0–24.0)
Bicarbonate: 17.1 mEq/L — ABNORMAL LOW (ref 20.0–24.0)
Delivery systems: POSITIVE
Drawn by: 295031
Drawn by: 308601
FIO2: 0.4 %
Inspiratory PAP: 10
O2 Saturation: 94.5 %
O2 Saturation: 96.1 %
O2 Saturation: 98.5 %
Patient temperature: 98.6
Patient temperature: 98.6
TCO2: 14.5 mmol/L (ref 0–100)
pO2, Arterial: 67.2 mmHg — ABNORMAL LOW (ref 80.0–100.0)
pO2, Arterial: 78.4 mmHg — ABNORMAL LOW (ref 80.0–100.0)

## 2010-07-01 LAB — LEGIONELLA ANTIGEN, URINE: Legionella Antigen, Urine: NEGATIVE

## 2010-07-01 LAB — GLUCOSE, CAPILLARY
Glucose-Capillary: 137 mg/dL — ABNORMAL HIGH (ref 70–99)
Glucose-Capillary: 138 mg/dL — ABNORMAL HIGH (ref 70–99)
Glucose-Capillary: 139 mg/dL — ABNORMAL HIGH (ref 70–99)
Glucose-Capillary: 172 mg/dL — ABNORMAL HIGH (ref 70–99)
Glucose-Capillary: 173 mg/dL — ABNORMAL HIGH (ref 70–99)
Glucose-Capillary: 187 mg/dL — ABNORMAL HIGH (ref 70–99)
Glucose-Capillary: 190 mg/dL — ABNORMAL HIGH (ref 70–99)
Glucose-Capillary: 191 mg/dL — ABNORMAL HIGH (ref 70–99)
Glucose-Capillary: 193 mg/dL — ABNORMAL HIGH (ref 70–99)
Glucose-Capillary: 196 mg/dL — ABNORMAL HIGH (ref 70–99)
Glucose-Capillary: 200 mg/dL — ABNORMAL HIGH (ref 70–99)
Glucose-Capillary: 203 mg/dL — ABNORMAL HIGH (ref 70–99)
Glucose-Capillary: 205 mg/dL — ABNORMAL HIGH (ref 70–99)
Glucose-Capillary: 209 mg/dL — ABNORMAL HIGH (ref 70–99)
Glucose-Capillary: 211 mg/dL — ABNORMAL HIGH (ref 70–99)
Glucose-Capillary: 217 mg/dL — ABNORMAL HIGH (ref 70–99)
Glucose-Capillary: 222 mg/dL — ABNORMAL HIGH (ref 70–99)
Glucose-Capillary: 222 mg/dL — ABNORMAL HIGH (ref 70–99)
Glucose-Capillary: 225 mg/dL — ABNORMAL HIGH (ref 70–99)
Glucose-Capillary: 228 mg/dL — ABNORMAL HIGH (ref 70–99)
Glucose-Capillary: 230 mg/dL — ABNORMAL HIGH (ref 70–99)
Glucose-Capillary: 230 mg/dL — ABNORMAL HIGH (ref 70–99)
Glucose-Capillary: 243 mg/dL — ABNORMAL HIGH (ref 70–99)
Glucose-Capillary: 245 mg/dL — ABNORMAL HIGH (ref 70–99)
Glucose-Capillary: 250 mg/dL — ABNORMAL HIGH (ref 70–99)
Glucose-Capillary: 258 mg/dL — ABNORMAL HIGH (ref 70–99)
Glucose-Capillary: 260 mg/dL — ABNORMAL HIGH (ref 70–99)
Glucose-Capillary: 263 mg/dL — ABNORMAL HIGH (ref 70–99)
Glucose-Capillary: 282 mg/dL — ABNORMAL HIGH (ref 70–99)
Glucose-Capillary: 290 mg/dL — ABNORMAL HIGH (ref 70–99)
Glucose-Capillary: 300 mg/dL — ABNORMAL HIGH (ref 70–99)
Glucose-Capillary: 304 mg/dL — ABNORMAL HIGH (ref 70–99)
Glucose-Capillary: 316 mg/dL — ABNORMAL HIGH (ref 70–99)
Glucose-Capillary: 105 mg/dL — ABNORMAL HIGH (ref 70–99)
Glucose-Capillary: 130 mg/dL — ABNORMAL HIGH (ref 70–99)
Glucose-Capillary: 134 mg/dL — ABNORMAL HIGH (ref 70–99)
Glucose-Capillary: 140 mg/dL — ABNORMAL HIGH (ref 70–99)
Glucose-Capillary: 144 mg/dL — ABNORMAL HIGH (ref 70–99)
Glucose-Capillary: 148 mg/dL — ABNORMAL HIGH (ref 70–99)
Glucose-Capillary: 150 mg/dL — ABNORMAL HIGH (ref 70–99)
Glucose-Capillary: 154 mg/dL — ABNORMAL HIGH (ref 70–99)
Glucose-Capillary: 154 mg/dL — ABNORMAL HIGH (ref 70–99)
Glucose-Capillary: 157 mg/dL — ABNORMAL HIGH (ref 70–99)
Glucose-Capillary: 160 mg/dL — ABNORMAL HIGH (ref 70–99)
Glucose-Capillary: 162 mg/dL — ABNORMAL HIGH (ref 70–99)
Glucose-Capillary: 162 mg/dL — ABNORMAL HIGH (ref 70–99)
Glucose-Capillary: 163 mg/dL — ABNORMAL HIGH (ref 70–99)
Glucose-Capillary: 165 mg/dL — ABNORMAL HIGH (ref 70–99)
Glucose-Capillary: 167 mg/dL — ABNORMAL HIGH (ref 70–99)
Glucose-Capillary: 167 mg/dL — ABNORMAL HIGH (ref 70–99)
Glucose-Capillary: 167 mg/dL — ABNORMAL HIGH (ref 70–99)
Glucose-Capillary: 169 mg/dL — ABNORMAL HIGH (ref 70–99)
Glucose-Capillary: 170 mg/dL — ABNORMAL HIGH (ref 70–99)
Glucose-Capillary: 173 mg/dL — ABNORMAL HIGH (ref 70–99)
Glucose-Capillary: 174 mg/dL — ABNORMAL HIGH (ref 70–99)
Glucose-Capillary: 174 mg/dL — ABNORMAL HIGH (ref 70–99)
Glucose-Capillary: 177 mg/dL — ABNORMAL HIGH (ref 70–99)
Glucose-Capillary: 177 mg/dL — ABNORMAL HIGH (ref 70–99)
Glucose-Capillary: 177 mg/dL — ABNORMAL HIGH (ref 70–99)
Glucose-Capillary: 179 mg/dL — ABNORMAL HIGH (ref 70–99)
Glucose-Capillary: 180 mg/dL — ABNORMAL HIGH (ref 70–99)
Glucose-Capillary: 185 mg/dL — ABNORMAL HIGH (ref 70–99)
Glucose-Capillary: 186 mg/dL — ABNORMAL HIGH (ref 70–99)
Glucose-Capillary: 187 mg/dL — ABNORMAL HIGH (ref 70–99)
Glucose-Capillary: 187 mg/dL — ABNORMAL HIGH (ref 70–99)
Glucose-Capillary: 188 mg/dL — ABNORMAL HIGH (ref 70–99)
Glucose-Capillary: 192 mg/dL — ABNORMAL HIGH (ref 70–99)
Glucose-Capillary: 194 mg/dL — ABNORMAL HIGH (ref 70–99)
Glucose-Capillary: 194 mg/dL — ABNORMAL HIGH (ref 70–99)
Glucose-Capillary: 197 mg/dL — ABNORMAL HIGH (ref 70–99)
Glucose-Capillary: 218 mg/dL — ABNORMAL HIGH (ref 70–99)
Glucose-Capillary: 223 mg/dL — ABNORMAL HIGH (ref 70–99)
Glucose-Capillary: 234 mg/dL — ABNORMAL HIGH (ref 70–99)
Glucose-Capillary: 257 mg/dL — ABNORMAL HIGH (ref 70–99)
Glucose-Capillary: 315 mg/dL — ABNORMAL HIGH (ref 70–99)
Glucose-Capillary: 329 mg/dL — ABNORMAL HIGH (ref 70–99)

## 2010-07-01 LAB — CBC
HCT: 27.2 % — ABNORMAL LOW (ref 39.0–52.0)
HCT: 27.7 % — ABNORMAL LOW (ref 39.0–52.0)
HCT: 31.8 % — ABNORMAL LOW (ref 39.0–52.0)
HCT: 32.5 % — ABNORMAL LOW (ref 39.0–52.0)
HCT: 32.7 % — ABNORMAL LOW (ref 39.0–52.0)
HCT: 42.8 % (ref 39.0–52.0)
HCT: 50.6 % (ref 39.0–52.0)
HCT: 50.6 % (ref 39.0–52.0)
Hemoglobin: 10.3 g/dL — ABNORMAL LOW (ref 13.0–17.0)
Hemoglobin: 11.3 g/dL — ABNORMAL LOW (ref 13.0–17.0)
Hemoglobin: 11.4 g/dL — ABNORMAL LOW (ref 13.0–17.0)
Hemoglobin: 11.4 g/dL — ABNORMAL LOW (ref 13.0–17.0)
Hemoglobin: 9.3 g/dL — ABNORMAL LOW (ref 13.0–17.0)
Hemoglobin: 11.1 g/dL — ABNORMAL LOW (ref 13.0–17.0)
Hemoglobin: 11.6 g/dL — ABNORMAL LOW (ref 13.0–17.0)
Hemoglobin: 12 g/dL — ABNORMAL LOW (ref 13.0–17.0)
Hemoglobin: 13.6 g/dL (ref 13.0–17.0)
Hemoglobin: 14.8 g/dL (ref 13.0–17.0)
Hemoglobin: 15.3 g/dL (ref 13.0–17.0)
Hemoglobin: 17.7 g/dL — ABNORMAL HIGH (ref 13.0–17.0)
MCHC: 34 g/dL (ref 30.0–36.0)
MCHC: 34.2 g/dL (ref 30.0–36.0)
MCHC: 34.5 g/dL (ref 30.0–36.0)
MCHC: 35 g/dL (ref 30.0–36.0)
MCHC: 33.5 g/dL (ref 30.0–36.0)
MCHC: 34.2 g/dL (ref 30.0–36.0)
MCHC: 34.5 g/dL (ref 30.0–36.0)
MCHC: 34.6 g/dL (ref 30.0–36.0)
MCHC: 34.7 g/dL (ref 30.0–36.0)
MCHC: 35 g/dL (ref 30.0–36.0)
MCHC: 35.1 g/dL (ref 30.0–36.0)
MCHC: 35.2 g/dL (ref 30.0–36.0)
MCV: 88.3 fL (ref 78.0–100.0)
MCV: 89 fL (ref 78.0–100.0)
MCV: 88.8 fL (ref 78.0–100.0)
MCV: 89.1 fL (ref 78.0–100.0)
MCV: 89.8 fL (ref 78.0–100.0)
MCV: 90 fL (ref 78.0–100.0)
Platelets: 336 10*3/uL (ref 150–400)
Platelets: 406 10*3/uL — ABNORMAL HIGH (ref 150–400)
Platelets: 425 10*3/uL — ABNORMAL HIGH (ref 150–400)
Platelets: 437 10*3/uL — ABNORMAL HIGH (ref 150–400)
Platelets: 472 10*3/uL — ABNORMAL HIGH (ref 150–400)
Platelets: 193 10*3/uL (ref 150–400)
Platelets: 264 10*3/uL (ref 150–400)
RBC: 3.58 MIL/uL — ABNORMAL LOW (ref 4.22–5.81)
RBC: 3.67 MIL/uL — ABNORMAL LOW (ref 4.22–5.81)
RBC: 3.7 MIL/uL — ABNORMAL LOW (ref 4.22–5.81)
RBC: 3.63 MIL/uL — ABNORMAL LOW (ref 4.22–5.81)
RBC: 3.79 MIL/uL — ABNORMAL LOW (ref 4.22–5.81)
RBC: 4.2 MIL/uL — ABNORMAL LOW (ref 4.22–5.81)
RBC: 4.41 MIL/uL (ref 4.22–5.81)
RBC: 4.78 MIL/uL (ref 4.22–5.81)
RBC: 4.93 MIL/uL (ref 4.22–5.81)
RBC: 5.68 MIL/uL (ref 4.22–5.81)
RBC: 5.7 MIL/uL (ref 4.22–5.81)
RDW: 12.4 % (ref 11.5–15.5)
RDW: 12.5 % (ref 11.5–15.5)
RDW: 12.6 % (ref 11.5–15.5)
RDW: 13.3 % (ref 11.5–15.5)
RDW: 13 % (ref 11.5–15.5)
RDW: 13.1 % (ref 11.5–15.5)
RDW: 13.1 % (ref 11.5–15.5)
RDW: 13.1 % (ref 11.5–15.5)
RDW: 13.3 % (ref 11.5–15.5)
WBC: 13.4 10*3/uL — ABNORMAL HIGH (ref 4.0–10.5)
WBC: 14.9 10*3/uL — ABNORMAL HIGH (ref 4.0–10.5)
WBC: 16 10*3/uL — ABNORMAL HIGH (ref 4.0–10.5)
WBC: 16.1 10*3/uL — ABNORMAL HIGH (ref 4.0–10.5)
WBC: 17.1 10*3/uL — ABNORMAL HIGH (ref 4.0–10.5)
WBC: 17.8 10*3/uL — ABNORMAL HIGH (ref 4.0–10.5)
WBC: 12.6 10*3/uL — ABNORMAL HIGH (ref 4.0–10.5)
WBC: 12.8 10*3/uL — ABNORMAL HIGH (ref 4.0–10.5)
WBC: 20 10*3/uL — ABNORMAL HIGH (ref 4.0–10.5)

## 2010-07-01 LAB — COMPREHENSIVE METABOLIC PANEL
ALT: 23 U/L (ref 0–53)
ALT: 46 U/L (ref 0–53)
ALT: 179 U/L — ABNORMAL HIGH (ref 0–53)
ALT: 18 U/L (ref 0–53)
ALT: 46 U/L (ref 0–53)
ALT: 66 U/L — ABNORMAL HIGH (ref 0–53)
AST: 19 U/L (ref 0–37)
AST: 23 U/L (ref 0–37)
AST: 24 U/L (ref 0–37)
AST: 17 U/L (ref 0–37)
AST: 20 U/L (ref 0–37)
AST: 21 U/L (ref 0–37)
AST: 233 U/L — ABNORMAL HIGH (ref 0–37)
AST: 38 U/L — ABNORMAL HIGH (ref 0–37)
Albumin: 2.1 g/dL — ABNORMAL LOW (ref 3.5–5.2)
Albumin: 2.6 g/dL — ABNORMAL LOW (ref 3.5–5.2)
Albumin: 2.3 g/dL — ABNORMAL LOW (ref 3.5–5.2)
Albumin: 2.3 g/dL — ABNORMAL LOW (ref 3.5–5.2)
Albumin: 2.7 g/dL — ABNORMAL LOW (ref 3.5–5.2)
Albumin: 4.9 g/dL (ref 3.5–5.2)
Alkaline Phosphatase: 100 U/L (ref 39–117)
Alkaline Phosphatase: 70 U/L (ref 39–117)
Alkaline Phosphatase: 91 U/L (ref 39–117)
Alkaline Phosphatase: 58 U/L (ref 39–117)
Alkaline Phosphatase: 63 U/L (ref 39–117)
Alkaline Phosphatase: 73 U/L (ref 39–117)
BUN: 12 mg/dL (ref 6–23)
BUN: 13 mg/dL (ref 6–23)
BUN: 14 mg/dL (ref 6–23)
BUN: 16 mg/dL (ref 6–23)
BUN: 15 mg/dL (ref 6–23)
BUN: 15 mg/dL (ref 6–23)
BUN: 18 mg/dL (ref 6–23)
CO2: 24 mEq/L (ref 19–32)
CO2: 25 mEq/L (ref 19–32)
CO2: 25 mEq/L (ref 19–32)
CO2: 11 mEq/L — ABNORMAL LOW (ref 19–32)
CO2: 20 mEq/L (ref 19–32)
CO2: 21 mEq/L (ref 19–32)
CO2: 24 mEq/L (ref 19–32)
CO2: 25 mEq/L (ref 19–32)
CO2: 25 mEq/L (ref 19–32)
Calcium: 8.2 mg/dL — ABNORMAL LOW (ref 8.4–10.5)
Calcium: 10.1 mg/dL (ref 8.4–10.5)
Calcium: 7.7 mg/dL — ABNORMAL LOW (ref 8.4–10.5)
Calcium: 7.8 mg/dL — ABNORMAL LOW (ref 8.4–10.5)
Calcium: 7.9 mg/dL — ABNORMAL LOW (ref 8.4–10.5)
Calcium: 9.9 mg/dL (ref 8.4–10.5)
Chloride: 101 mEq/L (ref 96–112)
Chloride: 103 mEq/L (ref 96–112)
Chloride: 105 mEq/L (ref 96–112)
Chloride: 95 mEq/L — ABNORMAL LOW (ref 96–112)
Chloride: 100 mEq/L (ref 96–112)
Chloride: 109 mEq/L (ref 96–112)
Creatinine, Ser: 0.8 mg/dL (ref 0.4–1.5)
Creatinine, Ser: 0.87 mg/dL (ref 0.4–1.5)
Creatinine, Ser: 0.77 mg/dL (ref 0.4–1.5)
Creatinine, Ser: 1.27 mg/dL (ref 0.4–1.5)
Creatinine, Ser: 1.4 mg/dL (ref 0.4–1.5)
Creatinine, Ser: 1.49 mg/dL (ref 0.4–1.5)
GFR calc Af Amer: >60 mL/min (ref >60)
GFR calc Af Amer: >60 mL/min (ref >60)
GFR calc Af Amer: >60 mL/min (ref >60)
GFR calc Af Amer: >60 mL/min (ref >60)
GFR calc Af Amer: >60 mL/min (ref >60)
GFR calc non Af Amer: >60 mL/min (ref >60)
GFR calc non Af Amer: >60 mL/min (ref >60)
GFR calc non Af Amer: >60 mL/min (ref >60)
GFR calc non Af Amer: 49 mL/min — ABNORMAL LOW (ref >60)
GFR calc non Af Amer: 53 mL/min — ABNORMAL LOW (ref >60)
GFR calc non Af Amer: 55 mL/min — ABNORMAL LOW (ref >60)
GFR calc non Af Amer: 59 mL/min — ABNORMAL LOW (ref >60)
Glucose, Bld: 190 mg/dL — ABNORMAL HIGH (ref 70–99)
Glucose, Bld: 247 mg/dL — ABNORMAL HIGH (ref 70–99)
Glucose, Bld: 254 mg/dL — ABNORMAL HIGH (ref 70–99)
Glucose, Bld: 200 mg/dL — ABNORMAL HIGH (ref 70–99)
Glucose, Bld: 209 mg/dL — ABNORMAL HIGH (ref 70–99)
Glucose, Bld: 345 mg/dL — ABNORMAL HIGH (ref 70–99)
Glucose, Bld: 406 mg/dL — ABNORMAL HIGH (ref 70–99)
Potassium: 4.1 mEq/L (ref 3.5–5.1)
Potassium: 4.5 mEq/L (ref 3.5–5.1)
Potassium: 3.1 mEq/L — ABNORMAL LOW (ref 3.5–5.1)
Potassium: 3.1 mEq/L — ABNORMAL LOW (ref 3.5–5.1)
Potassium: 3.6 mEq/L (ref 3.5–5.1)
Potassium: 3.8 mEq/L (ref 3.5–5.1)
Potassium: 4 mEq/L (ref 3.5–5.1)
Sodium: 131 mEq/L — ABNORMAL LOW (ref 135–145)
Sodium: 133 mEq/L — ABNORMAL LOW (ref 135–145)
Sodium: 137 mEq/L (ref 135–145)
Sodium: 140 mEq/L (ref 135–145)
Sodium: 142 mEq/L (ref 135–145)
Sodium: 145 mEq/L (ref 135–145)
Total Bilirubin: 0.4 mg/dL (ref 0.3–1.2)
Total Bilirubin: 0.5 mg/dL (ref 0.3–1.2)
Total Bilirubin: 0.5 mg/dL (ref 0.3–1.2)
Total Bilirubin: 0.6 mg/dL (ref 0.3–1.2)
Total Bilirubin: 0.6 mg/dL (ref 0.3–1.2)
Total Bilirubin: 1.1 mg/dL (ref 0.3–1.2)
Total Bilirubin: 1.2 mg/dL (ref 0.3–1.2)
Total Bilirubin: 1.7 mg/dL — ABNORMAL HIGH (ref 0.3–1.2)
Total Protein: 5.9 g/dL — ABNORMAL LOW (ref 6.0–8.3)
Total Protein: 5 g/dL — ABNORMAL LOW (ref 6.0–8.3)
Total Protein: 5.2 g/dL — ABNORMAL LOW (ref 6.0–8.3)
Total Protein: 5.7 g/dL — ABNORMAL LOW (ref 6.0–8.3)
Total Protein: 5.9 g/dL — ABNORMAL LOW (ref 6.0–8.3)
Total Protein: 6.1 g/dL (ref 6.0–8.3)

## 2010-07-01 LAB — CROSSMATCH

## 2010-07-01 LAB — LIPID PANEL
LDL Cholesterol: 175 mg/dL — ABNORMAL HIGH (ref 0–99)
Total CHOL/HDL Ratio: 6.8 RATIO
Triglycerides: 220 mg/dL — ABNORMAL HIGH (ref <150)
VLDL: 44 mg/dL — ABNORMAL HIGH (ref 0–40)

## 2010-07-01 LAB — BASIC METABOLIC PANEL
BUN: 12 mg/dL (ref 6–23)
BUN: 14 mg/dL (ref 6–23)
BUN: 18 mg/dL (ref 6–23)
CO2: 28 mEq/L (ref 19–32)
CO2: 28 mEq/L (ref 19–32)
CO2: 22 mEq/L (ref 19–32)
CO2: 24 mEq/L (ref 19–32)
CO2: 24 mEq/L (ref 19–32)
CO2: 25 mEq/L (ref 19–32)
CO2: 25 mEq/L (ref 19–32)
Calcium: 8 mg/dL — ABNORMAL LOW (ref 8.4–10.5)
Calcium: 8.3 mg/dL — ABNORMAL LOW (ref 8.4–10.5)
Calcium: 7.1 mg/dL — ABNORMAL LOW (ref 8.4–10.5)
Calcium: 7.3 mg/dL — ABNORMAL LOW (ref 8.4–10.5)
Calcium: 7.3 mg/dL — ABNORMAL LOW (ref 8.4–10.5)
Calcium: 7.8 mg/dL — ABNORMAL LOW (ref 8.4–10.5)
Chloride: 106 mEq/L (ref 96–112)
Chloride: 107 mEq/L (ref 96–112)
Creatinine, Ser: 1.05 mg/dL (ref 0.4–1.5)
Creatinine, Ser: 1.09 mg/dL (ref 0.4–1.5)
Creatinine, Ser: 0.81 mg/dL (ref 0.4–1.5)
Creatinine, Ser: 0.85 mg/dL (ref 0.4–1.5)
GFR calc Af Amer: >60 mL/min (ref >60)
GFR calc Af Amer: >60 mL/min (ref >60)
GFR calc Af Amer: >60 mL/min (ref >60)
GFR calc Af Amer: >60 mL/min (ref >60)
GFR calc non Af Amer: >60 mL/min (ref >60)
GFR calc non Af Amer: >60 mL/min (ref >60)
GFR calc non Af Amer: >60 mL/min (ref >60)
Glucose, Bld: 160 mg/dL — ABNORMAL HIGH (ref 70–99)
Glucose, Bld: 200 mg/dL — ABNORMAL HIGH (ref 70–99)
Glucose, Bld: 154 mg/dL — ABNORMAL HIGH (ref 70–99)
Glucose, Bld: 197 mg/dL — ABNORMAL HIGH (ref 70–99)
Potassium: 2.2 mEq/L — CL (ref 3.5–5.1)
Potassium: 3.5 mEq/L (ref 3.5–5.1)
Sodium: 136 mEq/L (ref 135–145)
Sodium: 138 mEq/L (ref 135–145)
Sodium: 139 mEq/L (ref 135–145)
Sodium: 141 mEq/L (ref 135–145)
Sodium: 142 mEq/L (ref 135–145)

## 2010-07-01 LAB — CLOSTRIDIUM DIFFICILE EIA: C difficile Toxins A+B, EIA: NEGATIVE

## 2010-07-01 LAB — URINALYSIS, ROUTINE W REFLEX MICROSCOPIC
Bilirubin Urine: NEGATIVE
Bilirubin Urine: NEGATIVE
Glucose, UA: >1000 mg/dL — AB
Ketones, ur: >80 mg/dL — AB
Leukocytes, UA: NEGATIVE
Nitrite: NEGATIVE
Nitrite: NEGATIVE
Specific Gravity, Urine: 1.018 (ref 1.005–1.030)
pH: 5 (ref 5.0–8.0)
pH: 5.5 (ref 5.0–8.0)

## 2010-07-01 LAB — DIFFERENTIAL
Basophils Absolute: 0.1 10*3/uL (ref 0.0–0.1)
Basophils Absolute: 0.1 10*3/uL (ref 0.0–0.1)
Basophils Relative: 0 % (ref 0–1)
Basophils Relative: 0 % (ref 0–1)
Basophils Relative: 1 % (ref 0–1)
Eosinophils Absolute: 0 10*3/uL (ref 0.0–0.7)
Eosinophils Absolute: 0.1 10*3/uL (ref 0.0–0.7)
Eosinophils Relative: 0 % (ref 0–5)
Lymphocytes Relative: 10 % — ABNORMAL LOW (ref 12–46)
Lymphocytes Relative: 13 % (ref 12–46)
Lymphs Abs: 1.7 10*3/uL (ref 0.7–4.0)
Monocytes Absolute: 1 10*3/uL (ref 0.1–1.0)
Monocytes Absolute: 1 10*3/uL (ref 0.1–1.0)
Monocytes Absolute: 0.6 10*3/uL (ref 0.1–1.0)
Monocytes Relative: 5 % (ref 3–12)
Neutro Abs: 14.3 10*3/uL — ABNORMAL HIGH (ref 1.7–7.7)
Neutro Abs: 14.4 10*3/uL — ABNORMAL HIGH (ref 1.7–7.7)
Neutro Abs: 15.2 10*3/uL — ABNORMAL HIGH (ref 1.7–7.7)
Neutrophils Relative %: 81 % — ABNORMAL HIGH (ref 43–77)
Neutrophils Relative %: 76 % (ref 43–77)

## 2010-07-01 LAB — MAGNESIUM
Magnesium: 2.1 mg/dL (ref 1.5–2.5)
Magnesium: 2 mg/dL (ref 1.5–2.5)
Magnesium: 2 mg/dL (ref 1.5–2.5)
Magnesium: 2.1 mg/dL (ref 1.5–2.5)

## 2010-07-01 LAB — URINE CULTURE
Colony Count: 65000
Special Requests: NEGATIVE
Special Requests: NEGATIVE

## 2010-07-01 LAB — LIPASE, BLOOD
Lipase: 140 U/L — ABNORMAL HIGH (ref 11–59)
Lipase: 77 U/L — ABNORMAL HIGH (ref 11–59)
Lipase: 97 U/L — ABNORMAL HIGH (ref 11–59)
Lipase: 191 U/L — ABNORMAL HIGH (ref 11–59)
Lipase: 192 U/L — ABNORMAL HIGH (ref 11–59)
Lipase: 195 U/L — ABNORMAL HIGH (ref 11–59)
Lipase: 593 U/L — ABNORMAL HIGH (ref 11–59)
Lipase: >2000 U/L — ABNORMAL HIGH (ref 11–59)

## 2010-07-01 LAB — URINALYSIS, MICROSCOPIC ONLY
Glucose, UA: >1000 mg/dL — AB
Ketones, ur: 40 mg/dL — AB
Leukocytes, UA: NEGATIVE
Nitrite: NEGATIVE
Protein, ur: NEGATIVE mg/dL
Urobilinogen, UA: 0.2 mg/dL (ref 0.0–1.0)

## 2010-07-01 LAB — CULTURE, BLOOD (ROUTINE X 2)
Culture: NO GROWTH
Culture: NO GROWTH
Culture: NO GROWTH

## 2010-07-01 LAB — URINE MICROSCOPIC-ADD ON

## 2010-07-01 LAB — CARDIAC PANEL(CRET KIN+CKTOT+MB+TROPI): Relative Index: INVALID (ref 0.0–2.5)

## 2010-07-01 LAB — TRIGLYCERIDES: Triglycerides: 152 mg/dL — ABNORMAL HIGH (ref <150)

## 2010-07-01 LAB — PHOSPHORUS: Phosphorus: 4.2 mg/dL (ref 2.3–4.6)

## 2010-07-01 LAB — AMYLASE: Amylase: 47 U/L (ref 27–131)

## 2010-07-01 LAB — STREP PNEUMONIAE URINARY ANTIGEN: Strep Pneumo Urinary Antigen: NEGATIVE

## 2010-07-10 ENCOUNTER — Encounter: Payer: Self-pay | Admitting: Family Medicine

## 2010-07-10 ENCOUNTER — Ambulatory Visit (INDEPENDENT_AMBULATORY_CARE_PROVIDER_SITE_OTHER): Payer: BC Managed Care – PPO | Admitting: Family Medicine

## 2010-07-10 DIAGNOSIS — IMO0001 Reserved for inherently not codable concepts without codable children: Secondary | ICD-10-CM

## 2010-07-10 DIAGNOSIS — J309 Allergic rhinitis, unspecified: Secondary | ICD-10-CM

## 2010-07-10 DIAGNOSIS — N529 Male erectile dysfunction, unspecified: Secondary | ICD-10-CM

## 2010-07-10 DIAGNOSIS — R21 Rash and other nonspecific skin eruption: Secondary | ICD-10-CM

## 2010-07-10 MED ORDER — INSULIN ASPART 100 UNIT/ML ~~LOC~~ SOLN: 3.0000 [IU] | Freq: Three times a day (TID) | SUBCUTANEOUS | Status: DC

## 2010-07-10 MED ORDER — KETOCONAZOLE 2 % EX CREA: TOPICAL_CREAM | Freq: Every day | CUTANEOUS | Status: DC

## 2010-07-10 MED ORDER — AZELASTINE HCL 0.1 % NA SOLN: 2.0000 | Freq: Two times a day (BID) | NASAL | Status: DC

## 2010-07-10 NOTE — Progress Notes (Signed)
  Subjective:    Patient ID: [name], male    DOB: 1951/03/10, 60 y.o.   MRN: [id number]  HPI Patient seen for followup. Type 2 diabetes on insulin. Blood sugars well controlled. Last A1c 6.4%. No hypoglycemia. No symptoms of hyperglycemia. Overall feels well. Fatigue from last visit has resolved.  History of seasonal and perennial allergies. He has used Astelin in past with good success the like refill. Has had some recent rhinorrhea and nasal congestion and occasional fleeting vertigo symptoms.  History of erectile dysfunction. Good success with Cialis previously.   Review of Systems  Constitutional: Negative for fever, activity change, appetite change and fatigue.  HENT: Negative for ear pain, congestion and trouble swallowing.   Eyes: Negative for pain and visual disturbance.  Respiratory: Negative for cough, shortness of breath and wheezing.   Cardiovascular: Negative for chest pain and palpitations.  Gastrointestinal: Negative for nausea, vomiting, abdominal pain, diarrhea, constipation, blood in stool, abdominal distention and rectal pain.  Genitourinary: Negative for dysuria, hematuria and testicular pain.  Musculoskeletal: Negative for joint swelling and arthralgias.  Skin: Negative for rash.  Neurological: Negative for dizziness, syncope and headaches.  Hematological: Negative for adenopathy.  Psychiatric/Behavioral: Negative for confusion and dysphoric mood.       Objective:   Physical Exam  Constitutional: He is oriented to person, place, and time. He appears well-developed and well-nourished.  HENT:  Right Ear: External ear normal.  Left Ear: External ear normal.  Mouth/Throat: Oropharynx is clear and moist. No oropharyngeal exudate.  Neck: Neck supple. No thyromegaly present.  Cardiovascular: Normal rate, regular rhythm and normal heart sounds.   No murmur heard. Pulmonary/Chest: Effort normal and breath sounds normal. No respiratory distress. He has no  wheezes. He has no rales.  Musculoskeletal: He exhibits no edema.  Lymphadenopathy:    He has no cervical adenopathy.  Neurological: He is alert and oriented to person, place, and time.          Assessment & Plan:  #1 type 2 diabetes adequate control. Continue current medications. Refill insulin. Recheck A1c 3 months #2 allergic rhinitis. Start Astelin 1-2 sprays per nostril twice daily #3 erectile dysfunction.  Continue Cialis as needed

## 2010-08-06 NOTE — Discharge Summary (Signed)
[name], [name]            ACCOUNT NO.:  [id number]   MEDICAL RECORD NO.:  [id number]          PATIENT TYPE:  INP   LOCATION:  1524                         [facility]ACILITY[facility]   PHYSICIAN:  Eduard Clos, MDDATE OF BIRTH:  02/27/1951   DATE OF ADMISSION:  08/28/2008  DATE OF DISCHARGE:                               DISCHARGE SUMMARY   ADDENDUM REPORT:  This is an addendum to the discharge summary dictated  by Dr. Marcellus Scott on September 12, 2008 and Dr. Arthor Captain on September 05, 2008  and please refer to the history and physical dictated by Dr. Glade Lloyd on  August 28, 2008.  Subsequent to the last discharge summary, the patient's  condition gradually improved and was able to tolerate oral diet.  The  patient's insulin dosages were adjusted.  The patient's tube feedings  were discontinued when she became able to tolerate diet orally.  At this  time Dr. Matthias Hughs feels the patient can be discharged home with followup  with Dr. Matthias Hughs within 1 weeks time.  The patient is also willing to  follow with [facility], patient's office, for which she  already has an appointment on September 20, 2008.  At the time of this  dictation, the patient is hemodynamically stable.   PROCEDURE:  Subsequent to the last discharge summary, none.   PERTINENT LABORATORY DATA:  The patient's CBG's were running 165 to 196.  Hemoglobin was around 10.1, hematocrit of 29.5, creatinine was 1.0.   FINAL DIAGNOSES:  1. Acute severe pancreatitis of undetermined etiology, presently      clinically resolved.  2. Type 2 diabetes mellitus, uncontrolled.  3. Hypertension.  4. Anemia.  5. Leukocytosis.  6. Sinus tachycardia.  7. Brief atrial fibrillation reverted to sinus rhythm.  8. Status post acute hypoxic respiratory failure.  9. Hospital acquired pneumonia treated completely.  10.Chronic back pain.   DISCHARGE MEDICATIONS:  1. Creon 2 capsules p.o. t.i.d.  2. 50 mg every 32 hours.  3. Lisinopril 40 mg p.o.  daily.  4. Metoprolol 100 mg p.o. b.i.d.  5. Protonix 40 mg p.o. b.i.d.  6. Lantus Insulin 40 units subcutaneously q.12.  7. NovoLog Insulin 3 units t.i.d., 151 to 200 4 units subcutaneously,      201 to 250 6 units subcutaneously, 251 to 300 8 units      subcutaneously, 301 to 350 10 units subcutaneously, 351 to 400 12      units subcutaneously.   PLAN:  The patient will be discharged home to follow with Dr. Matthias Hughs  within 1 weeks time as scheduled.  Patient to follow with his primary  care physician as scheduled on September 20, 2008 and at that time check a  CMET and CBC.  The patient advised about how to use Lantus Insulin and  NovoLog Insulin  Patient will be further instructed by nurse  at discharge.  To be cautious about hypoglycemia.  The patient is  instructed about sliding scale insulin.  Patient is to be on a cardiac-  healthy, carbohydrate modified diet.  In the event of any recurrence of  symptoms, the patient  is advised to go to the nearest emergency room.      Eduard Clos, MD  Electronically Signed     ANK/MEDQ  D:  09/17/2008  T:  09/18/2008  Job:  [id number]

## 2010-08-06 NOTE — Group Therapy Note (Signed)
[name], [name]            ACCOUNT NO.:  [id number]   MEDICAL RECORD NO.:  [id number]          PATIENT TYPE:  INP   LOCATION:  1230                         FACILITY:  [facility]   PHYSICIAN:  Michelene Gardener, MD    DATE OF BIRTH:  [birth year]                                 PROGRESS NOTE   DATE OF DISCHARGE:  To be determined.   CURRENT DIAGNOSES:  Include:  1. Acute respiratory failure.  2. Hospital-acquired pneumonia.  3. Accelerated hypertension.  4. A brief episode of atrial fibrillation that resolved without      intervention.  5. Acute pancreatitis that has been managed by gastroenterology.  6. Diabetes mellitus.  7. Upper extremity phlebitis without evidence of deep vein thrombosis.  8. Chronic back pain and the patient is on pain medications.  9. Metabolic acidosis that resolved.  10.Depression.  11.Obesity.  12.Electrolyte abnormalities.   Discharge medications will be dictated by discharging physician.   CONSULTATIONS:  1. Gastroenterology by [facility].  2. Pulmonary critical care.   PROCEDURES:  None.   DIAGNOSTIC STUDIES:  1. CT scan of the abdomen with contrast on August 28, 2008 showed      extensive acute pancreatitis with mild diffuse fatty infiltration      of the liver and bibasilar atelectasis.  2. CT scan of the pelvis with contrast on August 28, 2008 showed lipoma      of the left iliopsoas and no acute process.  3. Chest x-ray on August 28, 2008 showed hypoventilation with bibasilar      atelectasis.  4. Abdominal ultrasound on August 28, 2008 showed no evidence of acute      cholecystitis and mild common duct dilatation and fatty      infiltration of the liver with a small amount of right upper      quadrant ascites.  5. Abdominal x-ray on August 29, 2008 showed colonic ileus.  6. Chest x-ray on August 30, 2008 showed extensive right-sided air space      opacity consistent with pneumonia.  7. Repeat x-ray on August 31, 2008 showed no significant  change.  8. Repeat x-ray on August 31, 2008 showed a central venous catheter tip      in good position, with improved aeration bilaterally and persistent      right upper lobe pneumonia.  9. Chest x-ray on September 01, 2008 showed improving infiltrates.  10.Chest x-ray on September 01, 2008 showed PICC line in place.  11.Repeat x-ray on September 02, 2008 showed slight increase in bibasilar      atelectasis.  12.Abdominal x-ray on September 02, 2008 showed a slight decrease in      distention of the small bowel with persistent dilated lobes      compatible with a dynamic ileus and bilateral airspace disease,      right greater than left.  13.Chest x-ray on September 03, 2008 showed bilateral atelectasis with a      small pleural effusion.  14.Repeat x-ray on September 04, 2008 showed stable patchy bibasilar      opacity.  15.Repeat x-ray on September 05, 2008 showed  worsening aeration of the lung      bases, consistent with pulmonary edema.   COURSE OF HOSPITALIZATION:  Briefly, this patient was admitted to the hospital complaining of  diffuse abdominal pain.  This patient was found to have pancreatitis and  he was admitted to the hospital for treatment of his pancreatitis.  He  developed acute metabolic acidosis with compensatory hyperventilation  that required transfer to the ICU on August 30, 2008.  The patient was seen  by critical care.  He was initially started on BiPAP and then was  switched to nasal cannula.  His x-ray showed findings compatible with  right-sided pneumonia, especially in the right upper quadrant.  The  patient was treated with antibiotics.  Currently he is improving in his  pancreatitis.  He is still on TNA.  For details about hospitalization,  please see details.  1. Acute pancreatitis.  As mentioned, this patient presented initially      with diffuse abdominal pain.  His initial workup was compatible      with pancreatitis.  His initial lipase was more than 2000.      Abdominal ultrasound was  done and showed no evidence of gallstones.      CT scan of his abdomen and pelvis was done and showed findings      consistent with diffuse acute pancreatitis.  The patient was      monitored on the floor and was treated medically.  He was kept      n.p.o., was started on IV fluids, IV Primaxin and vanco.  He has      been treated with IV fluids in addition to pain medications and      antiemetics.  The patient made very slow improvement.  Management      has been done mainly by [facility].  The initial plan      on him was to proceed with MRCP but, as mentioned above, his      respiratory function deteriorated, so that was held.  Currently the      patient is improving slowly.  His pain is improving.  His lipase      improved up to 135.  He was started on TNA on August 25, 2008.  I      discussed the case with gastroenterology and plan on him is to      continue him on TNA.  He will be allowed to get oral medications.      He will get MRCP to look for any complications or any pancreatic      mass and to determine the duration for TNA.  Myself and Dr. Matthias Hughs      feel that the patient is not a very good candidate to receive home      TNA because of his ongoing pain issues in addition to lack of      primary care physician, so most likely will treat him with TNA      during this hospitalization and will watch him for few days.  If it      is felt that he might need TNA for a long period of time, then he      might be considered for a short-term rehab facility.  Dr. Matthias Hughs      recommended to discontinue his Primaxin at this point because of      ongoing diarrhea and the risk of developing C. diff and because of  the absence of a fever and a stable white count, I felt this is a      reasonable decision at this time.  Will continue this management      and will watch him.  2. Acute respiratory failure that is most likely secondary to      pneumonia.  The patient probably  aspirated because altered mental      status and as mentioned, on August 30, 2008 he developed shortness of      breath and hypoxemia that necessitated transfer to the ICU and he      was started on BiPAP.  Pulmonary critical care was consulted and he      has been treated with antibiotics, oxygen, and symptomatic      treatment.  The patient continued to improve.  He is off BiPAP.  He      is receiving oxygen.  As mentioned above, his antibiotics will be      stopped today because of ongoing diarrhea and will watch him      closely and will monitor his x-ray.  If there is any worsening,      then will restart his antibiotics.  3. Pneumonia management as #2 and I will repeat his x-ray in the a.m.  4. Accelerated hypertension.  The patient has been n.p.o. and has been      receiving Lopressor every 4 hours but systolic blood pressure      remained high in the range of 170s.  As of today, September 05, 2008,      the patient is allowed to take p.o., so I will restart his      lisinopril.  I will discontinue his IV Lopressor and switch him to      p.o. Lopressor.  Will watch his blood pressure closely and make      more adjustments as needed.  5. A brief episode of atrial fibrillation that lasted for a few hours.      Heart rate was uncontrolled at that time and partially might be      secondary to pain.  The patient was started on a Cardizem drip and      that was discontinued.  As mentioned above, he was maintained on IV      Lopressor, which will be changed to p.o. today.  At this point he      does not seem to need any anticoagulation unless his atrial      fibrillation recurs.  Echocardiogram was done on August 31, 2008 and      it showed an ejection fraction of 65-70% without evidence of wall      motion abnormalities.  6. Pulmonary edema.  His x-ray from September 05, 2008 showed airspace      disease that might reflect some edema.  Currently he is off IV      fluids and only receiving fluids  through his TNA.  I think those      might be pleural effusions secondary to his pancreatitis.  I will      treat him with IV Lasix and will repeat his x-ray and diurese him      as needed.  7. Diabetes mellitus.  His sugars have been within reasonable control.      In the ICU he was started on an insulin drip and that was      discontinued.  Currently he is on Lantus and sliding scale.  8. Upper extremity  phlebitis.  He developed upper extremity swelling      on September 03, 2008 as well as lower extremity swelling on September 02, 2008.  He had lower extremity venous Dopplers that came to be      negative.  He had an upper extremity venous Doppler that showed      superficial phlebitis without evidence of DVT.  Initially he was      put on a full dose of Lovenox and then after the results he was      switched to a prophylactic dose of Lovenox at 40 mg once a day.  9. Chronic back pain.  This patient has been having chronic back pain      and has been followed with the pain clinic.  He is doing a Lidoderm      patch, Percocet and morphine.  Because he has been n.p.o. and has      been receiving a high dose of Dilaudid at 2 mg every 3 hours, that      has been a very big concern and actually started tapering down his      pain medications.  As of today, I had good discussion with him and      with GI, and we decided that he might be able to take p.o., so I      have decreased his Dilaudid to 1 mg every 6 hours and restarted his      Percocet.  I will also apply a Duragesic patch.  The goal here is      to wean down his IV and to restart his oral medications and      hopefully that will be helpful.   This patient still needs a lot of attention during this hospitalization.  I have discussed with GI, will see the results of the MRCP.  If it is  felt that his that TNA will be continued for few days, then that will be  done in the hospital.  If the TNA will be continued for a longer period  of  time, then he might be considered for short-term rehab.  That could  be done at home but in his situation because of a lot of pain issues in  addition to the lack of a primary doctor, it is felt it will be safer to  be done at a rehab facility.  An updated summary will be dictated by the  discharging physician.   TOTAL ASSESSMENT TIME:  1 hour.      Michelene Gardener, MD  Electronically Signed     NAE/MEDQ  D:  09/05/2008  T:  09/05/2008  Job:  [id number]

## 2010-08-06 NOTE — Group Therapy Note (Signed)
[name], [name]            ACCOUNT NO.:  [id number]   MEDICAL RECORD NO.:  [id number]          PATIENT TYPE:  INP   LOCATION:  1404                         FACILITY:  [facility]   PHYSICIAN:  Marcellus Scott, MD     DATE OF BIRTH:  [birth year]                                 PROGRESS NOTE   ADDENDUM:   PRIMARY MEDICAL DOCTOR:  Unassigned.   This is an addendum to the interim discharge summary that was done by  Dr. Arthor Captain on September 05, 2008, and will update events from September 06, 2008,  to date.   DISCHARGE DIAGNOSES:  1. Acute pancreatitis of undetermined etiology.  Slowly improving.  2. Uncontrolled type 2 diabetes mellitus.  3. Hypertension.  4. Anemia.  5. Leukocytosis.  6. Sinus tachycardia.  7. Brief atrial fibrillation, reverted to sinus rhythm/ sinus      tachycardia.  8. Acute hypoxic respiratory failure, resolved.  9. Hospital-acquired pneumonia, treated.  10.Chronic back pain.   DISCHARGE MEDICATIONS:  To be dictated by discharging MD.   PROCEDURES:  1. Abdominal ultrasound on June 19, impression:  The patient pancreas      is not well seen sonographically, probably due to the nonacute      pancreatitis.  No definite pseudocyst is identified, although there      were some acute peripancreatic fluid collections on prior MRCP      dated June 16.  Long GI tube placement by Interventional Radiology      on June 18.  2. Long GI tube placement on June 16 by Radiology.  3. MRCP on June 15, impression:      a.     No evidence for biliary ductal dilatation or obstruction.       No gallstones or evidence of choledocholithiasis identified.      b.     Changes consistent with acute pancreatitis.      c.     Fatty infiltration of liver.      d.     Bilateral pleural effusions.  4. Chest x-ray on June 16:  No acute change.  5. Chest x-ray on June 15:  Worsening aeration at the lung bases      compared to June 14, most consistent with pulmonary edema.   PERTINENT LABS:  Urine culture 65,000 colonies per mL, multiple bacteria morphocytes.  Blood cultures x2 from June 20 are no growth to date.  Lipase on June  21:  97.  Amylase 47.  CBC with hemoglobin 9.9, hematocrit 28.9, white  blood cells 17.8, platelets 425,000.  C difficile toxin negative.   CONSULTATIONS:  [facility], Dr. Matthias Hughs, Dr. Evette Cristal, Dr. Randa Evens.   HOSPITAL COURSE AND PATIENT DISPOSITION:  Since June 16, the patient had accelerated hypertension with blood  pressures of 176/84 mmHg.  Blood pressure medications were adjusted.  Currently, those are well controlled with today's blood pressure of  131/77 mmHg.   The patient had long G tube placed in the third part of the duodenum and  started on Jevity.  At the same time, his TNA was tapered and  discontinued.  He has  been started on clear liquids.  With these  measures, the patient is slowly but steadily improving.  He has minimal  or no abdominal pain.  He has persistent nausea which may be  multifactorial from his pancreatitis, possible gastritis from acute  illness, and NG tube as well as narcotic pain medications.  GI has been  following him.  His antibiotics were discontinued a few days ago.  They  are considering advancing his diet to a low-fat diet soon.  At that  point, his NG tube feeding may be discontinued and he may be discharged  by the end of this week if he remains stable.  He has leukocytosis which  may be secondary to ongoing pancreatitis.  He did spike a low-grade  temperature but the workup for other causes of sepsis were negative.  That fever has resolved.   Uncontrolled diabetes.  This is probably secondary to the Jevity feeds.  His insulin has been increased to Lantus 35 units b.i.d. with sliding  scale.  However, his sugars are still in the 200s and 300s.  We will  discuss with Nutrition to see if they can make any changes to the tube  feeds or we might have to adjust insulin further and then consider  tapering it down  as insulin requirements decrease after the tube feeds  are discontinued.   Acute hypoxic respiratory failure from hospital-acquired pneumonia.  This has resolved.  The patient has been saturating well on room air.   Anemia and leukocytosis.  We will follow CBCs.   At this time, the patient is not yet stable for discharge.  However, he  seems to be progressing well with improvement in his spirits.  He has  been ambulating with Physical Therapy.      Marcellus Scott, MD  Electronically Signed     AH/MEDQ  D:  09/12/2008  T:  09/12/2008  Job:  [id number]   cc:   Bernette Redbird, M.D.  Fax: [phone]   Graylin Shiver, M.D.  Fax: [phone]   Llana Aliment. Malon Kindle., M.D.  Fax: [phone]

## 2010-08-06 NOTE — H&P (Signed)
[name], [name]            ACCOUNT NO.:  [id number]   MEDICAL RECORD NO.:  [id number]          PATIENT TYPE:  INP   LOCATION:  0104                         FACILITY:  [facility]   PHYSICIAN:  Theodosia Paling, MD    DATE OF BIRTH:  October 11, 1950   DATE OF ADMISSION:  08/28/2008  DATE OF DISCHARGE:                              HISTORY & PHYSICAL   PRIMARY CARE PHYSICIAN:  The patient does not have a PCP at this time.  He follows a doctor for his pain medication and gets his medical  management done at [facility].   CHIEF COMPLAINT:  Diffuse abdominal pain.   HISTORY OF PRESENT ILLNESS:  [name] is a very pleasant 60 year old  gentleman with a past medical history of chronic back pain status post  surgery on March 02, 2002, hypertension, diabetes mellitus and  depression who was in his usual state of health until yesterday  afternoon when he started to have epigastric pain which started to  radiate to his back.  He had one episode of vomiting associated with  that.  No diarrhea.  Very rapidly the pain became diffuse and he could  not tolerate it so he presented to the emergency room.  His CT scan was  suggestive of severe pancreatitis.  He had associated transaminitis and  elevation of alkaline phosphatase with hypertensive urgency and sinus  tachycardia.  Therefore [facility] were contacted for further  evaluation and management.   REVIEW OF SYSTEMS:  Essentially negative except what is mentioned above  in HPI.  In addition to that he also is experiencing chills.   PAST MEDICAL HISTORY:  1. Chronic back pain.  2. Hypertension.  3. Diabetes mellitus.  4. Depression.  5. Allergies.   HOME MEDICATIONS:  Include the following:  1. Metformin 500 mg p.o. q.12 hours.  2. Glimepiride 4 mg p.o. q.12 hours.  3. Lisinopril 40 mg daily.  4. Oxycodone/Tylenol 7.5/325 mg p.o. q.6 hours p.r.n.  5. Cyclobenzaprine 10 mg p.o. daily p.r.n.  6. Morphine sulfate 100 mg p.o.  q.8 h..  7. Zyrtec 1 tablet p.o. daily p.r.n.  8. Lexapro 10 mg p.o. daily.  9. Meclizine 12.5 mg p.o. daily p.r.n.  10.Lidoderm 700 mg transdermal patch daily p.r.n.  11.Arthrotec 75 mg p.o. q.12 hours p.r.n..  12.Albuterol 90 mcg MDI p.o. q.6 hours p.r.n.   FAMILY HISTORY:  Is significant for heart attack in sister and both  parents, history of stroke in maternal grandparents.  Not sure about  family history of pancreatitis..  There are 12 siblings and he stated it  is a possibility.   SOCIAL HISTORY:  Denies tobacco, alcohol or IV drug abuse.  Occasionally  drinks alcohol, does not work, currently lives with his wife.  His life  is limited by his back pain.   ALLERGIES:  He has seasonal allergies and is also allergic to CORTISONE.   PAST SURGICAL HISTORY:  He had undergone a back surgery in 2003  December.   PHYSICAL EXAMINATION:  VITAL SIGNS:  Blood pressure 196/118, heart rate  131 sinus, respiratory rate 24, saturation 97% at room air,  afebrile.  GENERAL:  He appears in a lot of discomfort, mildly tachypneic.  HEENT: Oral mucous membrane dry.  Nose no discharge.  LUNGS:  Normal.  Decreased air entry at both bases.  No rhonchi or  crepitations.  CARDIOVASCULAR:  Sinus tachycardia, no murmur or gallop heard.  GI: Diffuse severe tenderness present.  There is no guarding or rigidity  and bowel sounds are present.  Soft abdomen.  EXTREMITIES:  No pedal edema.  PSYCH:  Oriented x3.  CNS:  Follows commands.  Speech intact.   LAB DATA:  WBC 20.3, hemoglobin 17.8, hematocrit 50.6, platelet count  357, neutrophils 15.2%.  Sodium 142, potassium 3.7, chloride 100,  bicarbonate 25, glucose 406, BUN 18, creatinine 1.40.  GFR 53, alkaline  phosphatase 119, AST 225, ALT 175, total protein 7.4, albumin 4.7,  calcium 9.9, lipase more than 2000.  CT scan suggestive of severe  pancreatitis with pancreatic inflammation.   ASSESSMENT AND PLAN:  1. Severe pancreatitis.  The patient had  clinical, radiological and      biochemical evidence of severe pancreatitis.  We will keep the      patient n.p.o. and start him on fluids.  I will give him at least      four more liters of IV fluids and start him at 200 mL per hour of      normal saline with strict ins and outs.  At this time he is not in      respiratory distress however, I will admit the patient in intensive      care unit for close monitoring of his vitals.  Also given he is not      at the baseline as he is on significant high dose of morphine, I      will start him on Dilaudid PCA and also blood culture x2 will be      obtained along with that Imipenem is started.  Right upper quadrant      ultrasound to look for cholangitis is requested.  I have spoken to      Dr. Bosie Clos who will come and evaluate the patient and contribute      if any additional recommendations are needed at this time.      Continue to follow his vitals very closely.  We will repeat the CBC      and CMP at 4:00 p.m. today.  2. Hypertension.  Will start the patient on IV metoprolol and IV      hydralazine and evaluate his blood pressure closely while he is      admitted.  3. Diabetes mellitus.  Hold oral hypoglycemic agents and metformin,      moderate sliding scale insulin will be started.  4. Depression.  For now I am holding off until Lexapro is resumed,      p.o. antidepressant as soon as possible.  5. Prophylaxis.  Deep venous thrombosis and gastrointestinal      prophylaxis requested on board.  6. Code status.  The patient is full code.   The patient does not have a PCP to which this admission note can be  forwarded.  Total time spent on admission of this patient around 1 hour  and 20 minutes.      Theodosia Paling, MD  Electronically Signed     NP/MEDQ  D:  08/28/2008  T:  08/28/2008  Job:  [id number]   cc:   Dr. Vear Clock

## 2010-08-06 NOTE — Consult Note (Signed)
[name], [name]            ACCOUNT NO.:  [id number]   MEDICAL RECORD NO.:  [id number]          PATIENT TYPE:  INP   LOCATION:  0104                         FACILITY:  [facility]   PHYSICIAN:  Shirley Friar, MDDATE OF BIRTH:  [birth year]   DATE OF CONSULTATION:  08/28/2008  DATE OF DISCHARGE:                                 CONSULTATION   REASON FOR CONSULTATION:  We were asked to see [name] today in  consultation for pancreatitis by Dr. Sander Radon of [facility].   HISTORY OF PRESENT ILLNESS:  This is a 60 year old male who had sudden  onset of severe abdominal pain starting Saturday evening.  His pain has  been constant from the umbilicus to the epigastrium and radiates around  to his back.  It is severe despite Dilaudid and diazepam.  The patient  has a history of orthopedic issues, diabetes that is not well controlled  and asthma.  However, he tells me he does not drink any alcohol.  He  does not smoke cigarettes or use tobacco.  He does not take any NSAIDs.  He is on Percocet and Flexeril for orthopedic pain.  He has no new  medications.  He has had no sick contacts.  He has no history of  gallbladder disease.  He is not hypercalcemic.  He does not know himself  to be hyperlipidemic.  He has had no history of trauma to the abdomen.  However, his wife tells me that he frequently chops cords of wood and  will chop until he is very short of breath and vomiting from pain.  However, this is back pain.  He chopped three and a half cords of wood  in a May.  The patient is currently in the ED.  He does appear acutely  ill.   PAST MEDICAL HISTORY:  1. Spondylosis.  He is status post two spinal surgeries, one in 2001      and one in 2003.  2. Diabetes mellitus with peripheral neuropathy.  3. History of asthma.  4. Hypertension.  5. Arthritis in his hands.  6. Fungal infection of his skin and nails.  7. Status post resection of the gangrenous bowel in 1979.  8. Status  post left knee scope in 1971.  9. Status post wrist surgery in 1990.   ALLERGIES:  CORTISONE.   CURRENT MEDICATIONS:  Metformin, lisinopril, glimepiride, Arthrotec  which is an NSAID, Flexeril, morphine, Zyrtec, Lexapro, meclizine,  albuterol, Percocet, fluticasone and Lidoderm.   REVIEW OF SYSTEMS:  The patient has been short of breath and  occasionally vomiting with extreme exertion.  His stress level is up as  he is currently unemployed.   SOCIAL HISTORY:  He stopped drinking many years ago.  He does not smoke  or use any tobacco.  He does not use any recreational drugs.  He is  married.   FAMILY HISTORY:  Positive for colon cancer and ulcers in his mother.   PHYSICAL EXAMINATION:  GENERAL:  He is alert, oriented, short of short  of breath, appears in pain.  VITAL SIGNS:  Pulse 135, temperature 97.9, respirations 24,  blood  pressure is 197/114.  HEART:  Tachy.  LUNGS:  Painful to take a deep inspiration, but they sound clear to  auscultation bilaterally.  ABDOMEN:  Shows some distention.  It is most tender in the epigastrium,  but also tender throughout.  He has hypoactive bowel sounds.   LABORATORY DATA:  Current labs are significant for a hemoglobin of 7.8,  hematocrit 50.6, white count 20, platelets 357,000.  Lipase is greater  than 2000.  B-met shows sodium 142, potassium 3.7, chloride 100, bicarb  25, BUN 18, creatinine 1.4, glucose 406.  LFTs are significant for an  AST of 225, ALT 175, alk phos 119, total bilirubin 1.2.  Serum albumin  is 4.7.  Serum calcium is 9.9.  CT of his abdomen and pelvis shows  extensive acute changes of pancreatitis, mild thickening in the duodenal  wall.  No necrosis or fluid collections.  I have called Dr. Frazier Richards on  the telephone to speak with him about the CT scan.  He told me the  gallbladder looked good with no stones.  The CBD was not dilated.   ASSESSMENT:  Dr. Charlott Rakes had seen and examined the patient,  collected a  history and reviewed his chart.  His impression is that this  is a 60 year old male with acute severe pancreatitis of uncertain  etiology, possibly medication induced considering his metformin,  lisinopril and diclofenac.  Will plan to monitor closely, keep him  n.p.o.  Continue pain medications.  Check labs now and again in about 12  hours.  May consider an upper endoscopy when he is a little bit more  stable to rule out ulcer.   Thanks very much for this consultation.      Stephani Police, [place]      Shirley Friar, MD  Electronically Signed    MLY/MEDQ  D:  08/28/2008  T:  08/28/2008  Job:  [id number]   cc:   Shirley Friar, MD  Fax: [phone]

## 2010-08-09 NOTE — H&P (Signed)
[facility]  Patient:    [name], [name]                     MRN: [id number] Adm. Date:  [id number] Attending:  Thyra Breed CC:         Donzetta Sprung. Roney Jaffe., MD  Reuben Likes, M.D.   History and Physical  This is a followup evaluation.  HISTORY OF PRESENT ILLNESS:  Baby comes in for followup evaluation of his chronic low back pain on the basis of lumbar spondylosis.  Since his last evaluation, the patient has been noting overall stability with regard to his pain control except on the third day of his patch when he feels like he has breakthrough pain immensely.  He has continued to use the hydrocodone for this. He does not find that the Elavil is very helpful in getting him to sleep, and actually makes him hyper.  His other medications include Allegra, Flonase, Flovent, Zocor, Prinivil, and Flexeril.  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  I advised him I really wanted to see how he does for at least another four weeks before he takes that leap.  PHYSICAL EXAMINATION:  VITAL SIGNS:  Blood pressure 178/83, heart rate 100, respiratory rate 18, O2 saturations 98%, pain level 6/10.  NEUROLOGIC:  He demonstrates a good overall disposition today.  His deep tendon reflexes were 2+ and symmetric at the knees, 1+ at the ankles. Straight leg raise signs are negative.  IMPRESSION: 1. Low back pain on the basis of lumbar spondylosis. 2. Diabetes mellitus with peripheral neuropathy per Dr. Leslee Home. 3. Other medical problems per primary care physician.  DISPOSITION: 1. Increase frequency of Duragesic 75 mcg to every two days #15 with no    refill. 2. Continue with hydrocodone 7.5/750 one p.o. q.8h. p.r.n. break through pain    #50 with no refill. 3. Hold on Elavil. 4. Continue other medications. 5. Follow up with me in four weeks. DD:  10/13/00 TD:  10/13/00 Job:  [id number]

## 2010-08-09 NOTE — H&P (Signed)
[facility]  Patient:    [name] Visit Number: [id number] MRN: [id number]          Service Type: PMG Location: [facility] Attending Physician:  Thyra Breed Adm. Date:  08/14/2000   CC:         Jillyn Hidden P. Roney Jaffe., M.D.  Ravi R. Felipa Eth, M.D.   History and Physical  FOLLOW-UP EVALUATION  [name] comes in for follow-up evaluation of his chronic low back pain on the basis of lumbar spondylosis.  Since his last evaluation, he has done better with reducing the interval between his Duragesic to two days.  He continues to use the hydrocodone.  He is more active.  He on average has had his pain between a 4-1/2 and 4.  He has had some bad days with fronts coming through. He describes no new neurologic symptoms.  He has minimal side effects.  CURRENT MEDICATIONS: 1. Duragesic 75 mcg every two days. 2. Hydrocodone 7.5/750 one p.o. q.8h. p.r.n. breakthrough pain. 3. Allegra D b.i.d. 4. Flonase. 5. Flovent. 6. Zocor. 7. Prinivil. 8. Flexeril. 9. Diabetes medications as previously.  PHYSICAL EXAMINATION:  VITAL SIGNS:  Blood pressure 161/100, heart rate 95, respiratory rate 16, O2 saturations 97%, pain level is 7/10.  His exam is basically unchanged from his last visit.  IMPRESSION: 1. Low back pain on the basis of lumbar spondylosis - stable. 2. Other medical problems per Dr. Felipa Eth, his new primary care physician.  DISPOSITION: 1. Continue Duragesic 75 mcg every two days, #15 with no refill. 2. Hydrocodone 7.5/750 one p.o. q.8h. p.r.n. breakthrough pain, #50 with    no refill. 3. Flexeril 10 mg one p.o. q.8h. p.r.n., #50 with two refills. 4. He was given a note for his wife to take to work so that she will be home    in the evenings. 5. Follow up with me in eight weeks. Attending Physician:  Thyra Breed DD:  11/10/00 TD:  11/10/00 Job: [id number]

## 2010-08-09 NOTE — Op Note (Signed)
[facility] [place]  Patient:    [name], [name]                     MRN: [id number] Proc. Date: 02/11/00 Adm. Date:  [id number] Attending:  Donalee Citrin P                           Operative Report  PREOPERATIVE DIAGNOSES:  Severe discogenic mechanical low back pain, ruptured lumbar disk L5-S1, with bilateral S1 radiculopathies.  PROCEDURE:  Decompressive lumbar laminectomy at L5-S1, posterior lumbar interbody fusion at L5-S1 with microdiskectomy at the L5-S1 interspace, pedicle screw placement L5-S1 using the SDI spiral _____, 6.5 x 45s in L5, 6.5 x 40s in S1, Tangent allograft arthrodesis in the interspace using 10 x 26 mm Tangent allografts, posterolateral arthrodesis, and microdissection of the S1 nerve roots bilaterally.  SURGEON:  Donzetta Sprung. Roney Jaffe., M.D.  ASSISTANT:  Reinaldo Meeker, M.D.  ANESTHESIA:  General endotracheal.  IV FLUIDS:  3300.  ESTIMATED BLOOD LOSS:  300.  URINE OUTPUT:  400.  CLINICAL HISTORY:  The patient is a very pleasant 60 year old gentleman who has had a longstanding for the last two years progressive worsening back and left greater than right leg pain that radiates down to the soles of his feet, and he has had numbness and tingling in the soles of his feet as well as the lateral aspect of his feet.  He also noted weakness on plantar flexion with his left foot.  Over the last several days, he has had progressive worsening right leg pain as well as he has severe back pain.  The back is actually greater than the leg pain, that is worse when he is sitting and standing, better when he is lying down.  He has been wheelchair-bound over the last couple of months, on escalating doses of OxyContin and OxyIR.  Preoperative imaging revealed a large ruptured disk and free fragment underneath the left S1 nerve root as well as a central disk rupture compressing the right S1 nerve root as well, as well as severe degeneration of the  disk space and a mild degenerative _____.  DESCRIPTION OF PROCEDURE:  The patient was brought in the OR, was induced under general anesthesia, was positioned prone on the Wilson frame.  His back was prepped and draped in the usual sterile fashion.  An incision was made centering over the L5-S1 interspace with a 20 blade scalpel.  Bovie electrocautery was used to take it down to the subcutaneous tissue. Subperiosteal dissection was carried out to the L4 and L5 and S1 laminae bilaterally.  Intraoperative x-ray confirmed the L5-S1 interspace.  The subperiosteal dissection was carried out to the lateral aspect of the facet, exposing the TPs of L5 and S1 bilaterally and subsequent pedicles.  Then the spinous process was removed with Leksell rongeur.  The remainder of the laminotomy at L5 was continued bilaterally with the 3 and 4 mm Kerrison punches.  The ligamentum flavum was identified and removed in piecemeal fashion, exposing the underlying thecal sac.  Then attention was taken, the Midas Rex with the M8 drill bit was used to drill off the medial aspect of the facet, then the laminotomy and the gutter work was continued, exposing the L5 and S1 nerve roots bilaterally.  During the foraminotomy at the S1 on the left, it was noted to be tented up and displaced posteriorly by a large free fragment disk fragment  that was scarred and stuck to the undersurface of the S1 nerve root.  After the remainder of the gutter work, a medial facetectomy was completed.  The Midas D operating microscope was draped and brought into the field and under microscopic dissection, the remainder of the S1 nerve root was teased off this free fragment, and a large free fragment disk rupture was removed from underneath the S1 nerve root using nerve hook and pituitary rongeurs.  After this free fragment was removed, then the annulus was identified.  Bipolar electrocautery was used to coagulate the epidural veins, and the  annulotomy was made with an 11 blade scalpel.  The disk space was cleaned out on the left side, removing all evidence of free fragment disk under the S1 nerve root.  An 8 mm distractor was placed and then attention taken to the right side.  The microscope was brought over and used to visualize the right S1 nerve root, and microdissection was carried out along the S1 nerve root and Bovie electrocautery was used to coagulate the epidural veins overlying the interspace at L5-S1 on the right.  Annulotomy was made. The remainder of this disk space was cleaned out so that no further bulging was noted.  There was also noted to be a large disk bulge centrally and displacing the right S1 nerve root posteriorly.  After this was cleaned out, a 10 mm distractor was inserted in the right side, the 8 mm was removed.  Then the medium-sized cutter followed by the chisel were inserted on the patients left side.  Fluoroscopy confirmed the depth of the chisel, which had prepared the end plates.  The remainder of the free disk fragments were removed.  A 10 x 26 mm Tangent allograft was selected, inserted in the patients left side. Then the 10 mm distractor was removed and attention taken back to the right side.  This disk was cut and chiseled out, and then the central aspect was further cleaned out with a downgoing Epstein curette and upgoing pituitary was continued to remove the central disk bulge.  Then the autograft was packed against the allograft on the left side, and a 10 x 26 mm _____ allograft was subsequently inserted on the right side.  Fluoroscopy confirmed good depth, approximately 2 mm deep to the posterior margin of the vertebral body wall. Then attention was taken to the rod placement.  The lateral aspect of the facets were decorticated as well as the transverse processes bilaterally. Then the L5 pedicle was identified using fluoroscopy and a pilot hole was drilled with the Midas Rex with the AM8  drill bit down the _____ shaft, and it was tapped with a 5.25 tap.  A 6.75 x 45 screw was inserted at L5 on the patients right.  Then attention was taken to the S1 pedicle.  This again was  identified.  It was tapped, probed, and subsequently tapped again, and the pedicle was noted to be competent.  The 6.75 x 40 screw was inserted at S1, and fluoroscopy confirmed good depth and position, and then attention was taken to the left side.  Again the remainder of the lateral aspect of the facets and the TPs were decorticated.  The L5 pedicle was identified, a pilot hole was drilled, it was probed, then tapped, probed again, and then the 6.5 x 45 was inserted on the patients left side and the 6.5 x 40 in S1 on patients left.  After this, the wound was copiously irrigated.  The remainder of the autograft was packed laterally along the TPs prior to the rod placement bilaterally.  Then the rods were placed.  The top tightening nuts were inserted.  The S1 nuts were tightened down, then the L5 pedicle screw was compressed against the S1 pedicle screw bilaterally and tightened down sequentially.  The wound again was copiously irrigated, and meticulous hemostasis was maintained.  Gelfoam was overlaid on top of the dura after the nerve roots had been explored with an angled hockey stick.  Then the remainder of the autograft was packed along laterally overlying the rod and the lateral aspect of the TPs.  A medium Hemovac drain was placed.  The muscle and fascia were approximated with 0 interrupted Vicryls, then the subcutaneous tissue was closed with 2-0 interrupted Vicryls, and the skin was closed with a running 3-0 subcuticular.  Benzoin and Steri-Strips were applied.  At the end of the case, all needle counts and sponge counts were correct.  The patient went to the recovery room in stable condition. DD:  02/11/00 TD:  02/12/00 Job: [id number]

## 2010-08-09 NOTE — Op Note (Signed)
NAMEBAYARD, MORE                        ACCOUNT NO.:  [id number]   MEDICAL RECORD NO.:  [id number]                   PATIENT TYPE:  INP   LOCATION:  3029                                 FACILITY:  [facility]   PHYSICIAN:  Donalee Citrin, M.D.                     DATE OF BIRTH:  Oct 18, 1950   DATE OF PROCEDURE:  03/02/2002  DATE OF DISCHARGE:                                 OPERATIVE REPORT   PREOPERATIVE DIAGNOSIS:  Diskogenic mechanical instability at L4-5 with  bilateral L5 radiculopathies.   POSTOPERATIVE DIAGNOSIS:  Diskogenic mechanical instability at L4-5 with  bilateral L5 radiculopathies.   OPERATION PERFORMED:  1. Decompressive laminectomy, L4-5.  2. Removal of hardware L5-S1.  3. Pedicle screw fixation L4 to L5 to S1.  4. Posterior lumbar interbody fusion L4-5, posterolateral arthrodesis L4-5,     placement of medium Hemovac drain.   SURGEON:  Donalee Citrin, M.D.   ASSISTANT:  Kathaleen Maser. Pool, M.D.   ANESTHESIA:  General endotracheal.   INDICATIONS FOR PROCEDURE:  The patient is a very pleasant 60 year old  gentleman who underwent a lumbar fusion operation approximately two years  ago at L5-S1.  Did very well initially; however, over the last several  months has had progressive deterioration with worsening back pain and  bilateral leg pain radiating down the top of his foot and his big toe.  The  patient has been refractory to conservative treatment.  Preoperative imaging  with MRI scan showed degenerative disk disease at L4-5 with some spinal  stenosis as well as lumbar flexion and extension films showed him to have a  couple of millimeters of retrolisthesis with his lumbar flexion and  extension of movement.  The patient was recommended extending his fusion to  L4-5, and explained the risks and benefits of removal of hardware, extension  to an L4-5 fusion and posterolateral arthrodesis and pedicle screw fixation  from L4 to S1.  The risks and benefits were explained and  the patient  decided to proceed forward.   DESCRIPTION OF PROCEDURE:  The patient was brought to the operating room  induced under general anesthesia ___________  back was prepped and draped in  sterile fashion.  The old incision was extended cephalad and opened up.  The  subperiosteal dissection was carried down to the lamina of L3 and L4  exposing the  TP's of L4 as well as the pedicle screw and hardware construct  at L5-S1.  The _________ were removed and the rods were removed from L5-S1  then laminotomy was begun at L4 using Leksell rongeur.  Using a 3 and 4 mm  Kerrison punch, _____________ lamina of L3 was removed to gain access.  Subligamentous and remainder of the laminotomy at L4 was continued down  exposing both the L4 and L5 nerve roots.  There was ___________  scar tissue  noted at the inferior aspect of the laminotomy.  This was all removed  piecemeal fashion and after the L4 nerve root was adequately decompressed  out its foramen, a radical medial facetectomy was done bilaterally.  Epidural vein was coagulated over the disk space.  Using D'Errico nerve root  retractor  the left L5 nerve root was inspected medially.  Annulotomy was  made.  The disk _______________  cleaned out and the 8 distractor was  inserted.  Fluoroscopy confirmed this to need a 10 wedge so subsequently the  _______________ coagulated and cleaned out on the right side.  A 10  distractor was inserted on the right and the left side using a size 10  cutter and chisel ______________  allograft.  Using a 10 x 24 mm tangent  allograft under fluoroscopy confirmed depth and trajectory along the way.  This was inserted into the interspace.  Then on the right side, this  procedure was repeated.  The locally harvested allograft was packed against  the allograft on the left side and a 10 x 22 mm tangent allograft was  inserted on the patient's right side.  Then after all allografts were  inserted, fluoroscopy  confirmed depth and trajectory and position  approximately 2 mm deep to the posterior vertebral body line.  Then attempts  to __________________  placement using a high speed drill,  pilot holes were  drilled at the site of the L4 pedicle and fluoroscopy confirmed position and  trajectory and the pedicles were cannulated with the awl, tapped with a 575  tap and 675 x 50 screws inserted L4.  All pedicles were probed and 360  degree orientation at each step along the way and noted to be competent.  In  addition, the pedicles were explored from within the canal and the medial  aspect of the pedicles noted to be competent as well.  After all the  pedicle screws were placed, the TP's and lateral gutters were decorticated  aggressively from L4 to L5.  Then the locally harvested autograft was  inserted at L4-5.  Then the rods were sized, selected and inserted  ____________were tightened down at S1 and L5 and the L4 pedicle screw was  compressed against L5.  Then meticulous hemostasis was maintained ,  _____________ x-ray confirmed screws, rod and bone graft position.  Gelfoam  was overlaid atop the dura.  Muscle fascia reapproximated 0 interrupted  Vicryl after medium Hemovac drain was placed.  Subcutaneous tissue closed  with 2-0 interrupted Vicryl  and skin closed with running 4-0 subcuticular.  Benzoin Steri-Strips applied.  The patient was taken to the recovery room in  stable condition.  At the of the case, sponge and needle counts were  correct.                                               Donalee Citrin, M.D.    GC/MEDQ  D:  03/02/2002  T:  03/02/2002  Job:  [id number]

## 2010-08-09 NOTE — H&P (Signed)
[facility]  Patient:    [name], [name]                     MRN: [id number] Adm. Date:  [id number] Attending:  Thyra Breed                         History and Physical  NEW PATIENT EVALUATION  HISTORY OF PRESENT ILLNESS:  Smaran is a very pleasant 60 year old gentleman who is sent to [place] by Dr. Donzetta Sprung. Roney Jaffe. for evaluation of his chronic pain and management.  The patient describes problems with low back pain which began as a teenager at the age of 54.  At that time, he was diagnosed as having bulging disk and treated conservatively.  Approximately eight years ago, he actually had a ruptured disk and this was treated with medications, lumbar epidural steroid injections and physical therapy and he did well with this.  About five years ago, he developed diabetes and began having increasing lower back discomfort. He was seen by several physicians in the past including Dr. Loletta Parish over at [facility] who attempted facet joint nerve blocks with RF which apparently exacerbated his pain significantly.  He was treated with OxyContin at that time, with hydrocodone for breakthrough pain.  He was later seen by Dr. Flo Shanks in [facility] and eventually came under Dr. Dola Argyle care. Dr. Wynetta Emery was able to surgically intervene with regard to his lower back with a fusion and diskectomy with Ray cages.  The patient presented in a wheelchair and has subsequently been able to walk.  For six weeks, he did remarkably well, but while in the grocery store, felt something pop in his back and developed increasing pain radiating out into his left lower extremity and difficulties with increased numbness and tingling into the lower extremities, weakness and a sense of increased weakness when his bladder was full.  He was sent over to [facility] where he received one epidural steroid injection which exacerbated his diabetes, resulting in blood sugars in  excess of 600 mg% and pustular formations over his scalp, a urinary tract infection and he has subsequently not been back through this.  Dr. Wynetta Emery has been adjusting his opiates.  He presents today with a pain level of about 5/10 with what he describes as two types of pain, a burning-type of discomfort which is constantly present radiating out into the lower extremities all the way to the left great toe and out to the mid-part of the right lower extremity.  This is exacerbated to an extent by prolonged sitting or lying down and helped to a degree by the medications he takes.  He has a second type of a pain which is a sharper pain which is exacerbated by hyperextensions of the back or lifting.  He is currently taking Neurontin 300 mg three times a day which he does not feel is helping and has resulted in mental clouding.  He takes Flexeril 10 mg q.8h. which he states makes him sleepy but helps significantly to reduce his discomfort.  He takes hydrocodone 7.5/750 mg one tablet six hours after he takes his OxyContin 20 mg one tablet, alternating these on the every-12-hour basis so that he takes two of each of these.  He has not been complicated by any constipation.  He does not like taking higher doses of opiates as it clouds his concentration.  He is active as a Estate manager/land agent.  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  He has poor sleep patterns, sleeping for 45 minutes in a stretch each night, then awaking and having to move about.  He feels as though he is coping well and is not interested in any psychologic evaluations.  CURRENT MEDICATIONS: 1. Allegra-D two times a day. 2. Flonase p.r.n. 3. Flovent p.r.n. 4. Neurontin 300 mg t.i.d. 5. Hydrocodone 7.5/750 mg p.r.n. 6. Zocor 20 mg per day. 7. OxyContin 20 mg twice a day. 8. Flexeril 10 mg three times a day. 9. Prinivil 20 mg per day.  ALLERGIES:   CORTISONE.  FAMILY HISTORY:  Positive for coronary artery disease, diabetes, asthma, colon cancer, hypertension and osteoarthritis.  ACTIVE MEDICAL PROBLEMS:  Diabetes mellitus complicated by peripheral neuropathy for which he has been treated for approximately five years; asthma with allergic rhinitis; hypertension; arthritis of his hands; and a fungal infection of the nails of the left hand and both feet.  PAST SURGICAL HISTORY:  Significant for arthroscopy of his left knee in 1971, resection of a gangrenous bowel in 1979, wrist surgery in 1990 to remove cystic swelling and in 2001, his back surgery.  SOCIAL HISTORY:  Patient is a nonsmoker, nondrinker.  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  REVIEW OF SYSTEMS:  GENERAL:  Negative.  HEAD:  Negative.  EYES:  Significant for hypertensive changes in the eyes and history of blindness in his right eye which is exacerbated by corticosteroids.  NOSE, MOUTH AND THROAT:  Positive for allergic rhinitis.  EARS:  Significant for inner ear problems related to his allergies.  PULMONARY:  Positive for asthma.  CARDIOVASCULAR:  Significant for chronic hypertension.  GI:  Negative.  GU:  Significant for a sense that he has weakness in his lower extremities when his bladder is full. MUSCULOSKELETAL:  Significant for knee, wrist and shoulder pain with history of bursitis in his right shoulder at one point.  NEUROLOGIC:  See HPI for pertinent positives.  No history or seizures or stroke.  HEMATOLOGIC: Negative.  CUTANEOUS:  Significant for nail changes with fungus and an eruption on his trunk and axillae which looks like candidiasis.  ENDOCRINE: Positive for five-year history of diabetes with peripheral neuropathy. Negative for any thyroid problems.  PSYCHIATRIC:  Situational anxiety and poor sleep quality likely related to his underlying pain.  ALLERGY/IMMUNOLOGIC: See above.  PHYSICAL EXAMINATION:  VITAL SIGNS:  Blood pressure is  148/90.  Heart rate is 103.  Respiratory rate is 22.  O2 saturation is 97%.  Pain level is 6/10.  HEENT:  Head was normocephalic, atraumatic with scalp shaved.  He had  piercings of his nose and ears.  Eyes:  Extraocular movements intact with conjunctivae and sclerae clear.  Nose:  Patent nares.  Oropharynx demonstrated upper plate with lower plate demonstrating some carious teeth and mucosa intact.  NECK:  Supple without lymphadenopathy.  Carotids were 2+ and symmetric without bruits.  LUNGS:  Clear.  HEART:  Regular rate and rhythm.  ABDOMEN:  Bowel sounds present.  GENITALIA:  Not performed.  RECTAL:  Not performed.  BACK:  Exam revealed well-healed surgical scar with increased pain on hyperextension to 10 degrees with good forward flexion.  Straight leg raise signs were negative.  EXTREMITIES:  Radial pulses and dorsalis pedis pulses were 2+ and symmetric with no edema.  He did have chronic nail changes of the fingernails of  the left hand and of the feet suggestive of a fungal infection.  He looks like he had a candidiasis-type eruption under his armpits and across his trunk.  NEUROLOGIC:  The patient was oriented to person, place, time and reason for visit.  Cranial nerves II-XII were grossly intact.  Deep tendon reflexes were symmetric in the upper extremities, 2+ at the knees, 1+ at the ankles. Plantar reflexes were downgoing.  Sensory exam showed decreased pinprick over the left lower extremity over the great toe, intact over the DD:  08/17/00 TD:  08/17/00 Job: [id number]

## 2010-08-09 NOTE — H&P (Signed)
[facility]  Patient:    [name], [name]                     MRN: [id number] Adm. Date:  [id number] Attending:  Thyra Breed CC:         Garlon Hatchet., M.D.  Reuben Likes, M.D.   History and Physical  [name] comes in for followup evaluation of his chronic low back pain syndrome on the basis of lumbar spondylosis with chronic radiculopathy to the left lower extremity.  Since his last evaluation he has noted that the Duragesic has helped, but has been an incomplete decrease in pain and unfortunately he was stung by a hornet which led to some nausea and vomiting.  Associated with the retching he has injured his back and reinjured it over the weekend, but it has gotten better.  He has used up all of his hydrocodone.  He is taking on average at least two to three of these per day.  He rates his pain as 7/10. He notes that he is trying to do his exercises daily.  He notes that sitting helps but standing or laying down increases discomfort.  CURRENT MEDICATIONS:  Prinivil, Antivert, Flexeril, K-Dur, albuterol inhaler, Flonase, Allegra D, hydrocodone with Duragesic.  PHYSICAL EXAMINATION  VITAL SIGNS:  Blood pressure 145/92, heart rate 104, respiratory rate 18, O2 saturation 99%, pain level 7/10.  NEUROLOGIC:  Unchanged from his last visit.  I have records from Dr. ______ of the [facility] where he was given epidural steroid injections complicated by diabetes followed by what appears to be lumbar facette medial nerve blocks which gave him about four to five hours worth of pain relief then more severe pain following this.  He had a repeat medial nerve block done which reduced his pain from 8/10 to 3/10 for about 48 hours.  He was brought back in for radio frequency ______ and he stated that following the radio frequency ______ he had exacerbations in his pain and at that point he was dropped from the pain clinic.  IMPRESSION: 1. Low back  pain on the basis of lumbar spondylosis manifested as facette    joint arthritis and degenerative disk disease with chronic radiculopathy. 2. Diabetes mellitus with peripheral neuropathy and retinopathy per Dr.    Lorenz Coaster. 3. Hypertension per Dr. Lorenz Coaster. 4. Allergic diathesis per Dr. Macomb Callas.  DISPOSITION: 1. Increase Duragesic to 75 mcg q.3 days. 2. Continue with the Flexeril. 3. Continue with the amitriptyline. 4. Remain off Neurontin which he does not miss. 5. Hydrocodone 7.5/750 one p.o. q.8h. p.r.n. #50 with no refill. 6. Follow-up with me in four weeks. DD:  09/15/00 TD:  09/15/00 Job: [id number]

## 2010-08-09 NOTE — H&P (Signed)
[facility]  Patient:    [name], [name]                     MRN: [id number] Adm. Date:  [id number] Attending:  Thyra Breed CC:         Garlon Hatchet., M.D.   History and Physical  NEUROLOGICAL:  The patient is oriented to person, place, time, and reason for visit. Cranial nerves 2 through 12 are grossly intact. Deep tendon reflexes are symmetric in the upper extremities, 2+ at the knees, 1+ at the ankles. He had attenuated pinprick over the left great toe with intact over the lateral aspect of the foot. Right lower extremity demonstrated intact pinprick with attenuated vibratory sense present from the knees up bilaterally. He demonstrates symmetric bulk and tone with regard to muscle testing.  LABORATORY DATA:  An MRI from June 11, 2000, demonstrated a herniated disk at L2-3, 3-4, and 4-5. There was also evidence of scar tissue at 4-5. He appears to have fairly good positioning of his fusion at 5-S1.  IMPRESSION: 1. Low back pain syndrome with lumbar radiculopathy with underlying lumbar    degenerative disk disease and herniated disk, status post fusion with    combination of what appears to be some facet joint syndrome with underlying    lumbar spondylosis and neuropathic pain from his radicular descriptions. 2. Diabetes mellitus with peripheral neuropathy and retinopathy. 3. Hypertension. 4. Allergic diathesis characterized by allergic rhinitis and asthma. 5. Candidiasis over the skin with tinea in his nails. 6. Secondary osteoarthritis of his left knee, wrist, and right shoulder.  DISPOSITION: 1. The patient plans to follow up with his primary care physician, Dr. Lorenz Coaster,    for his general medical needs, and I encouraged him to do this. 2. In the meantime, I advised him I would like to adjust some of his    medications around. We will need to get records from Dr. ______ at some    point. He has agreed to sign a controlled substance contract  with [place], and    we will take him off his OxyContin and place him on Duragesic 50 mcg one    every three days, #10 with no refill. He is aware of the side effects of    this in detail. 3. Taper off of Neurontin 300 mg one p.o. b.i.d. x 7 days, then one p.o. q.d.    x 7 days, then stop, #21 with no refill. 4. Hydrocodone 7.5/750 one p.o. q.6-8h. p.r.n., #50 with no refill. He was    encouraged to use this very sparingly. 5. Amitriptyline 25 mg one p.o. q.p.m., #30 with three refills. 6. Continue with Flexeril. 7. Follow up with me in four weeks. 8. I advised him to think about seeing the psychologist, but he wishes to hold    for the time being. I plan to see him back in follow-up in four weeks. DD:  08/17/00 TD:  08/17/00 Job: [id number]

## 2010-08-13 ENCOUNTER — Other Ambulatory Visit: Payer: Self-pay | Admitting: Family Medicine

## 2010-08-13 ENCOUNTER — Other Ambulatory Visit: Payer: Self-pay | Admitting: *Deleted

## 2010-08-13 DIAGNOSIS — IMO0001 Reserved for inherently not codable concepts without codable children: Secondary | ICD-10-CM

## 2010-08-13 MED ORDER — "INSULIN SYRINGE-NEEDLE U-100 31G X 5/16"" 0.5 ML MISC": Status: DC

## 2010-08-13 MED ORDER — INSULIN ASPART 100 UNIT/ML ~~LOC~~ SOLN: SUBCUTANEOUS | Status: DC

## 2010-08-13 NOTE — Telephone Encounter (Signed)
Please send new rxs for Novolog 100 unit/ml and his BD Syringes to [facility].  Also, need Cialis samples.

## 2010-08-13 NOTE — Telephone Encounter (Signed)
Both Rx sent, wife informed no cialis samples.  Rx sent in error to local [facility].  I cancelled that order and sent to [facility] e scribe

## 2010-09-10 ENCOUNTER — Other Ambulatory Visit: Payer: Self-pay | Admitting: Family Medicine

## 2010-10-02 ENCOUNTER — Other Ambulatory Visit: Payer: BC Managed Care – PPO

## 2010-10-07 ENCOUNTER — Other Ambulatory Visit (INDEPENDENT_AMBULATORY_CARE_PROVIDER_SITE_OTHER): Payer: BC Managed Care – PPO

## 2010-10-07 DIAGNOSIS — E119 Type 2 diabetes mellitus without complications: Secondary | ICD-10-CM

## 2010-10-08 NOTE — Progress Notes (Signed)
Quick Note:  Pt informed on home VM ______ 

## 2010-10-09 ENCOUNTER — Ambulatory Visit: Payer: BC Managed Care – PPO | Admitting: Family Medicine

## 2010-10-14 ENCOUNTER — Ambulatory Visit (INDEPENDENT_AMBULATORY_CARE_PROVIDER_SITE_OTHER): Payer: BC Managed Care – PPO | Admitting: Family Medicine

## 2010-10-14 ENCOUNTER — Telehealth: Payer: Self-pay | Admitting: Family Medicine

## 2010-10-14 ENCOUNTER — Encounter: Payer: Self-pay | Admitting: Family Medicine

## 2010-10-14 DIAGNOSIS — R5381 Other malaise: Secondary | ICD-10-CM

## 2010-10-14 DIAGNOSIS — E119 Type 2 diabetes mellitus without complications: Secondary | ICD-10-CM

## 2010-10-14 DIAGNOSIS — IMO0001 Reserved for inherently not codable concepts without codable children: Secondary | ICD-10-CM

## 2010-10-14 DIAGNOSIS — R5383 Other fatigue: Secondary | ICD-10-CM

## 2010-10-14 DIAGNOSIS — E1165 Type 2 diabetes mellitus with hyperglycemia: Secondary | ICD-10-CM

## 2010-10-14 DIAGNOSIS — M25469 Effusion, unspecified knee: Secondary | ICD-10-CM

## 2010-10-14 LAB — POCT URINALYSIS DIPSTICK
Blood, UA: NEGATIVE
Spec Grav, UA: 1.025

## 2010-10-14 MED ORDER — INSULIN GLARGINE 100 UNIT/ML ~~LOC~~ SOLN: 40.0000 [IU] | Freq: Two times a day (BID) | SUBCUTANEOUS | Status: DC

## 2010-10-14 NOTE — Telephone Encounter (Signed)
Pt req to get a1c checked at [facility] a wk prior to ov in October. Pls advise.

## 2010-10-14 NOTE — Telephone Encounter (Signed)
Please advise 

## 2010-10-14 NOTE — Telephone Encounter (Signed)
OK to schedule

## 2010-10-14 NOTE — Patient Instructions (Signed)
Titrate Lantus up 2 units every other day until fasting blood sugars 130 or less consistently Schedule followup with your orthopedist regarding right knee

## 2010-10-14 NOTE — Telephone Encounter (Signed)
Pt informed Rx future order done for [facility]

## 2010-10-14 NOTE — Progress Notes (Signed)
  Subjective:    Patient ID: [name], male    DOB: [birth year], 60 y.o.   MRN: [id number]  HPI Patient seen for medical followup. Type 2 diabetes. Recent poor control. No significant dietary changes. Recent fasting blood sugars 170. Remains on Lantus 40 units daily and he has increased his NovoLog. No hypoglycemia. Recent A1c 8.3% last week.  Recurrent right knee injury. About 8 days ago felt popping sensation while walking. Did not fall on knee. Moderate swelling which is decreased past day or so. No ecchymosis. No locking. Generalized weakness. Previous arthroscopy right knee  Cloudy urine past few days. No burning with urination. Denies fever or chills.  Generalized malaise. Feels similar to way he felt after previous pancreatitis-but no abdominal pain, nausea. Chronic poor sleep. Increase stress recently.      Review of Systems  Constitutional: Positive for fatigue. Negative for fever, chills, appetite change and unexpected weight change.  Eyes: Negative for visual disturbance.  Respiratory: Negative for cough and shortness of breath.   Cardiovascular: Negative for chest pain.  Genitourinary: Negative for dysuria.  Musculoskeletal: Positive for joint swelling (r knee) and arthralgias.  Skin: Negative for rash.  Neurological: Negative for dizziness.       Objective:   Physical Exam  Constitutional: He appears well-developed and well-nourished.  Cardiovascular: Normal rate, regular rhythm and normal heart sounds.   Pulmonary/Chest: Effort normal and breath sounds normal. No respiratory distress. He has no wheezes. He has no rales.  Musculoskeletal:       Right knee reveals moderate effusion. No warmth or erythema. No ecchymosis. Some pain with flexion and extension. Minimal lateral joint line tenderness. No medial joint line tenderness. Anterior drawer sign is negative. Collateral ligament testing is normal  Psychiatric: He has a normal mood and affect. His behavior is  normal.          Assessment & Plan:  #1 type 2 diabetes poorly controlled. Titrate Lantus 2 units every other day until fastings around 130 reassess A1c in 3 months and continue with mealtime novolog. #2 dysuria rule out UTI  #3 right knee effusion. Suspect recurrent meniscal injury.   If pain persists followup orthopedist  #4 fatigue. Likely multifactorial. Recent thyroid function and CBC unremarkable

## 2010-11-11 ENCOUNTER — Telehealth: Payer: Self-pay | Admitting: Family Medicine

## 2010-11-11 NOTE — Telephone Encounter (Signed)
Please advise 

## 2010-11-11 NOTE — Telephone Encounter (Signed)
Pt went to orthopedic doctor, Dr Philis Kendall, re: knee pain. Pt had an MRI done and it was recommended that pt may need either orthoscopic or knee replacement surgery. Pt wants to know what Dr Caryl Never thinks about this? What would Dr Caryl Never recommend him to do? Pt said that he has increased insulin by 4 units and pts diabetes numbers fasting are below 120 on consistent basis.  Pt needs surgical clearance.

## 2010-11-12 NOTE — Telephone Encounter (Signed)
Do not see any reasons he could not have surgery but needs at least EKG and clearance.

## 2010-11-12 NOTE — Telephone Encounter (Signed)
Message left on personally identified VM pt will need OV for EKG and surgical clearance.

## 2010-11-13 ENCOUNTER — Other Ambulatory Visit: Payer: Self-pay | Admitting: Family Medicine

## 2010-11-13 MED ORDER — "INSULIN SYRINGE-NEEDLE U-100 31G X 5/16"" 0.5 ML MISC": Status: DC

## 2010-11-13 NOTE — Telephone Encounter (Signed)
Pt needs refills of Insulin Syringe-Needle U-100 (BD INSULIN SYRINGE ULTRAFINE) 31G X 5/16" 0.5 ML [facility] [address] until [facility] order arrives.

## 2010-11-28 ENCOUNTER — Encounter: Payer: Self-pay | Admitting: Family Medicine

## 2010-11-28 ENCOUNTER — Ambulatory Visit (INDEPENDENT_AMBULATORY_CARE_PROVIDER_SITE_OTHER): Payer: BC Managed Care – PPO | Admitting: Family Medicine

## 2010-11-28 VITALS — BP 134/96 | Temp 97.7°F | Wt 256.0 lb

## 2010-11-28 DIAGNOSIS — B009 Herpesviral infection, unspecified: Secondary | ICD-10-CM | POA: Insufficient documentation

## 2010-11-28 DIAGNOSIS — Z01818 Encounter for other preprocedural examination: Secondary | ICD-10-CM

## 2010-11-28 DIAGNOSIS — IMO0001 Reserved for inherently not codable concepts without codable children: Secondary | ICD-10-CM

## 2010-11-28 DIAGNOSIS — I10 Essential (primary) hypertension: Secondary | ICD-10-CM

## 2010-11-28 MED ORDER — VALACYCLOVIR HCL 500 MG PO TABS: 500.0000 mg | ORAL_TABLET | Freq: Every day | ORAL | Status: DC

## 2010-11-28 NOTE — Progress Notes (Signed)
  Subjective:    Patient ID: [name], male    DOB: 10-Jun-1950, 60 y.o.   MRN: [id number]  HPI Here for preoperative clearance. Right knee arthroscopic surgery. Reportedly has large meniscal tear. His medical problems include a history of hypertension, hyperlipidemia, previous gallstone pancreatitis, type 2 diabetes.  No history of CAD. History of mild intermittent asthma. No history of cerebrovascular disease. Denies recent chest pains. No dyspnea with exertion. Medications reviewed. Diabetes stable and improved since last visit. No hypoglycemia. No prior problems with anesthesia.  Hypertension has been stable. No orthostasis.     Review of Systems  Constitutional: Negative for fever, activity change, appetite change and fatigue.  HENT: Negative for ear pain, congestion and trouble swallowing.   Eyes: Negative for pain and visual disturbance.  Respiratory: Negative for cough, shortness of breath and wheezing.   Cardiovascular: Negative for chest pain and palpitations.  Gastrointestinal: Negative for nausea, vomiting, abdominal pain, diarrhea, constipation, blood in stool, abdominal distention and rectal pain.  Genitourinary: Negative for dysuria, hematuria and testicular pain.  Musculoskeletal: Positive for joint swelling and arthralgias (right knee).  Skin: Negative for rash.  Neurological: Negative for dizziness, syncope and headaches.  Hematological: Negative for adenopathy.  Psychiatric/Behavioral: Negative for confusion and dysphoric mood.       Objective:   Physical Exam  Constitutional: He is oriented to person, place, and time. He appears well-developed and well-nourished. No distress.  HENT:  Mouth/Throat: Oropharynx is clear and moist.  Neck: Neck supple. No thyromegaly present.       No carotid bruit  Cardiovascular: Normal rate, regular rhythm and normal heart sounds.   No murmur heard. Pulmonary/Chest: Effort normal and breath sounds normal. No respiratory  distress. He has no wheezes. He has no rales.  Musculoskeletal:       Brace right knee  Lymphadenopathy:    He has no cervical adenopathy.  Neurological: He is alert and oriented to person, place, and time. No cranial nerve deficit.  Psychiatric: He has a normal mood and affect. His behavior is normal.          Assessment & Plan:  Preoperative evaluation. Patient has stable chronic medical problems of type 2 diabetes, hypertension. Remote history of pancreatitis which was felt to be gallstone pancreatitis. EKG today reveals sinus rhythm. No acute changes. No contraindications to surgery.

## 2010-12-02 ENCOUNTER — Ambulatory Visit: Payer: BC Managed Care – PPO | Admitting: Family Medicine

## 2011-01-10 ENCOUNTER — Other Ambulatory Visit (INDEPENDENT_AMBULATORY_CARE_PROVIDER_SITE_OTHER): Payer: BC Managed Care – PPO

## 2011-01-10 DIAGNOSIS — E119 Type 2 diabetes mellitus without complications: Secondary | ICD-10-CM

## 2011-01-14 ENCOUNTER — Ambulatory Visit: Payer: BC Managed Care – PPO | Admitting: Family Medicine

## 2011-01-14 ENCOUNTER — Telehealth: Payer: Self-pay | Admitting: Family Medicine

## 2011-01-14 NOTE — Telephone Encounter (Signed)
Pt requesting results of lab work. Pt requesting you contact her

## 2011-01-15 NOTE — Telephone Encounter (Signed)
Pt informed A1C is 7, pt heading to knee surgery

## 2011-01-17 ENCOUNTER — Ambulatory Visit (INDEPENDENT_AMBULATORY_CARE_PROVIDER_SITE_OTHER): Payer: BC Managed Care – PPO | Admitting: Family Medicine

## 2011-01-17 ENCOUNTER — Encounter: Payer: Self-pay | Admitting: Family Medicine

## 2011-01-17 VITALS — BP 140/82 | Temp 98.3°F | Wt 263.0 lb

## 2011-01-17 DIAGNOSIS — I10 Essential (primary) hypertension: Secondary | ICD-10-CM

## 2011-01-17 DIAGNOSIS — IMO0001 Reserved for inherently not codable concepts without codable children: Secondary | ICD-10-CM

## 2011-01-17 DIAGNOSIS — Z23 Encounter for immunization: Secondary | ICD-10-CM

## 2011-01-17 NOTE — Progress Notes (Signed)
  Subjective:    Patient ID: [name], male    DOB: 08-11-1950, 60 y.o.   MRN: [id number]  HPI  Medical followup. Upcoming knee surgery sequentially right and then left. He has diabetes which is improved. Recent A1c 7.0%. No hypoglycemia. Fasting blood sugars recently consistently around 100 or less. No hypoglycemia. Blood pressure fairly well controlled. Compliant with all medications. Takes simvastatin for hyperlipidemia. No myalgias.  History of erectile dysfunction. Requesting samples of Cialis. No history of CAD. No nitroglycerin use.  Past Medical History  Diagnosis Date  . Hyperlipidemia   . Diabetes mellitus   . Gallstone pancreatitis   . Hypertension    Past Surgical History  Procedure Date  . Cholecystectomy     reports that he has never smoked. He does not have any smokeless tobacco history on file. His alcohol and drug histories not on file. family history is not on file. Allergies  Allergen Reactions  . Cortisone Acetate     REACTION: HBS, boils         Review of Systems  Constitutional: Negative for fatigue.  Eyes: Negative for visual disturbance.  Respiratory: Negative for cough, chest tightness and shortness of breath.   Cardiovascular: Negative for chest pain, palpitations and leg swelling.  Genitourinary: Negative for dysuria.  Neurological: Negative for dizziness, syncope, weakness, light-headedness and headaches.       Objective:   Physical Exam  Constitutional: He appears well-developed and well-nourished.  HENT:  Mouth/Throat: Oropharynx is clear and moist.  Neck: Neck supple.  Cardiovascular: Normal rate and regular rhythm.   Pulmonary/Chest: Effort normal and breath sounds normal. No respiratory distress. He has no wheezes. He has no rales.  Musculoskeletal: He exhibits no edema.       Feet reveal no skin lesions. Good distal foot pulses. Good capillary refill. No calluses. Normal sensation with monofilament testing     Lymphadenopathy:    He has no cervical adenopathy.  Neurological: He is alert.  Psychiatric: He has a normal mood and affect. His behavior is normal.          Assessment & Plan:  #1 type 2 diabetes. Insulin treated. Improved. Continue current regimen  #2 hypertension. Stable. Work on weight loss after he gets through upcoming surgery #3 hyperlipidemia. Simvastatin 40 mg daily. Fasting lipids at followup. #4 erectile dysfunction.  Samples Cialis given 5 mg daily.

## 2011-01-19 ENCOUNTER — Encounter: Payer: Self-pay | Admitting: Family Medicine

## 2011-01-22 ENCOUNTER — Encounter (HOSPITAL_COMMUNITY): Payer: Self-pay

## 2011-01-22 ENCOUNTER — Ambulatory Visit (HOSPITAL_COMMUNITY)
Admission: RE | Admit: 2011-01-22 | Discharge: 2011-01-22 | Disposition: A | Discharge status: Home / Self Care | Payer: BC Managed Care – PPO | Source: Ambulatory Visit | Attending: Orthopedic Surgery | Admitting: Orthopedic Surgery

## 2011-01-22 ENCOUNTER — Other Ambulatory Visit (HOSPITAL_COMMUNITY): Payer: Self-pay | Admitting: Orthopedic Surgery

## 2011-01-22 ENCOUNTER — Encounter (HOSPITAL_COMMUNITY): Payer: BC Managed Care – PPO

## 2011-01-22 DIAGNOSIS — Z01811 Encounter for preprocedural respiratory examination: Secondary | ICD-10-CM

## 2011-01-22 DIAGNOSIS — Z01818 Encounter for other preprocedural examination: Secondary | ICD-10-CM | POA: Insufficient documentation

## 2011-01-22 DIAGNOSIS — Z01812 Encounter for preprocedural laboratory examination: Secondary | ICD-10-CM | POA: Insufficient documentation

## 2011-01-22 LAB — DIFFERENTIAL
Basophils Relative: 0 % (ref 0–1)
Eosinophils Absolute: 0.1 10*3/uL (ref 0.0–0.7)
Eosinophils Relative: 1 % (ref 0–5)
Lymphs Abs: 3.7 10*3/uL (ref 0.7–4.0)
Monocytes Relative: 8 % (ref 3–12)
Neutrophils Relative %: 53 % (ref 43–77)

## 2011-01-22 LAB — BASIC METABOLIC PANEL
BUN: 19 mg/dL (ref 6–23)
CO2: 23 mEq/L (ref 19–32)
Chloride: 98 mEq/L (ref 96–112)
Creatinine, Ser: 1.02 mg/dL (ref 0.50–1.35)
Glucose, Bld: 119 mg/dL — ABNORMAL HIGH (ref 70–99)

## 2011-01-22 LAB — SURGICAL PCR SCREEN: MRSA, PCR: NEGATIVE

## 2011-01-22 LAB — URINALYSIS, ROUTINE W REFLEX MICROSCOPIC
Bilirubin Urine: NEGATIVE
Ketones, ur: NEGATIVE mg/dL
Leukocytes, UA: NEGATIVE
Nitrite: NEGATIVE
Protein, ur: NEGATIVE mg/dL
Urobilinogen, UA: 0.2 mg/dL (ref 0.0–1.0)

## 2011-01-22 LAB — CBC
MCH: 30.7 pg (ref 26.0–34.0)
MCV: 86.4 fL (ref 78.0–100.0)
Platelets: 245 10*3/uL (ref 150–400)
RDW: 12.5 % (ref 11.5–15.5)

## 2011-01-22 NOTE — Patient Instructions (Signed)
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 Injections, How and Where to Give, Child - TO - How and Where to Give Insulin Injections, Child Intertrigo-Brief - TO - Intertrigo Jaw Dislocation (Mandibular Dislocation) - TO - Jaw Dislocation Knee Pain, General - TO - Knee Pain Knee Replacement - TO - Knee Replacement, Total Knee Replacement, Care After - TO - Knee Replacement, Total, Care After Laceration Care - TO - Laceration Care, Adult Laceration Care, Easy-to-Read - TO - Laceration Care, Adult, Easy-to-Read Laceration Care, Inside Mouth - TO - Mouth Laceration Laceration Care, Inside Mouth, Easy-to-Read - TO -  Mouth Laceration, Easy-to-Read LEEP (Loop Electrosurgical Excision Procedure) - TO - Loop Electrosurgical Excision Procedure  Lexiscan - TO - Lexiscan Stress Electrocardiography Ligamentous Sprain - TO - Ligament Sprain Marine - Coelenterate Stings - TO - Coelenterate Sting Marine - Sea Urchin Injuries - TO - Sea Urchin Injury Melanoma (Skin Cancer) - TO - Melanoma Menorrhagia (Heavy Periods) - TO - Menorrhagia Mesenteric Ischemia - TO - Acute Mesenteric Ischemia Mononucleosis, Infectious - TO - Infectious Mononucleosis Mononucleosis, Infectious, Easy-to-Read - TO - Infectious Mononucleosis, Easy-to-Read MRSA Infection in Pregnancy - TO - MRSA Infection During Pregnancy Myelogram, Care After, Easy-to-Read - TO - Myelography, Care After, Easy-to-Read Myelogram, Easy-to-Read - TO - Myelography, Easy-to-Read Myelogram, Neuro-Radiology - TO - Myelography Nasal Foreign Body-Brief - TO - Nasal Foreign Body, Easy-to-Read Nuclear Dobutamine Stress Test - TO - Dobutamine Stress Electrocardiographyew Title Oligohydraminos - TO - Oligohydramnios Organ Ultrasound - TO - Abdominal or Pelvic Ultrasound Organ Ultrasound, Easy-to-Read - TO - Abdominal or Pelvic Ultrasound, Easy-to-Read Pain Medications, Generic Instructions For - TO - Pain Medicine Instructions Pain Medications, Generic Instructions For, Easy-to-Read - TO - Pain Medicine Instructions, Easy-to-Read Patent Ductus Arteriosus (PDA) - TO - Patent Ductus Arteriosus Pediatric IV's - TO - Intravenous Catheter, Pediatric Phenylketonuria (PKU) - TO - Phenylketonuria Plasmapheresis, Blood Cleansing - TO - Plasmapheresis, Adult Polycystic Ovarian Syndrome (PCOS) - TO - Polycystic Ovarian Syndrome Polyps, Colon - TO - Colon Polyps Post-Concussion Syndrome, Adult - TO - Post-Concussion Syndrome Post-Concussion Syndrome-Brief - TO - Post-Concussion Syndrome, Easy-to-Read Postpartum Tubal Ligation (PPTL) - TO - Postpartum Tubal Ligation Precautions,  Airborne, Easy-to-Read - TO - Airborne Precautions, Easy-to-Read Precautions, Contact, Easy-to-Read - TO - Contact Precautions, Easy-to-Read Precautions, Droplet, Easy-to-Read - TO - Droplet Precautions, Easy-to-Read Pregnancy - Cytomegalovirus - TO - Cytomegalovirus During Pregnancy Pregnancy - Heartburn - TO - Heartburn During Pregnancy Pregnancy - Heartburn, Easy-to-Read - TO - Heartburn During Pregnancy, Easy-to-Read Premenstrual Syndrome (PMS) - TO - Premenstrual Syndrome   Proctalgia Fugax (Rectal Pain) - TO - Proctalgia Fugax Prolactin Levels PRL's - TO - Prolactin Levels Proteinuria, Protein in the Urine - TO - Proteinuria Pruritic Urticarial Papules and Plaques of Pregnancy (PUPPP) - TO - Pruritic Urticarial Papules and Plaques of Pregnancy Pseudofolliculitis Barbae (Razor Bumps) - TO - Pseudofolliculitis Barbae Psoriasis-Brief - TO - Psoriasis, Easy-to-Read PTSD, Post Traumatic Stress Disorder - TO - Post-Traumatic Stress Disorder Pulmonary Stress Testing - TO - Pulmonary Stress Test Radial Head Fractures, Easy-to-Read - TO - Radial Head Fracture, Easy-to-Read Repetitive Strain Injuries and Trauma Disorders - TO - Repetitive Strain Injuries [place] Fever (RVF) - TO - [place] Fever Sebaceous Cyst-Brief - TO - Epidermal Cyst, Easy-to-Read Sebaceous Cysts - TO - Epidermal Cyst Sepsis - TO - Sepsis, Adult Shingles (Herpes Zoster) - TO - Shingles Spider Bite-Brief - TO - Spider Bite Stress Echocardiogram - TO - Exercise Stress Echocardiography Stress Test, Cardiovascular - TO - Exercise Stress Electrocardiography Stroke, Hemorrhagic - TO - Hemorrhagic Stroke Sub Q Shot From a Bottle, Easy-to-Read - TO - Subcutaneous Injection Using a Syringe and Vial Sub Q Shot Syringe, Easy-to-Read - TO - Subcutaneous Injection Using a Syringe Tethered Spinal Cord Syndrome - TO - Tethered Cord Syndrome Ticks (Deer Tick Bites) - TO - Deer Tick Bite Ticks (Wood Tick Bites) - TO - Wood Tick  Bite Ticks (Wood Tick Bites), Easy-to-Read - TO - Wood Tick Bite, Easy-to-Read Toe Dislocation, Care After - TO - Toe Dislocation Toxocariasis, Roundworm Infection - TO - Toxocariasis Transesophageal Echocardiogram - TO - Transesophageal Echocardiography Vulva Biopsy, Easy-to-Read - TO - Vulva Biopsy, Care After, Easy-to-Read Why You Should Seek Dental Care - TO - Dental Care and Dentist Visits DEACTIVATED TITLES (77) Alcohol Intoxication-Brief ALT, Alanine Aminotransferase Anal Pruritus-Brief Anemia, Iron Deficiency-Brief Appendectomy, Child Appendectomy, Child, Care After Avoiding Insect Bites Biopsy, Discharge Instructions for Bruise, Easy-to-Read Cat Bite, Easy-to-Read Cold Therapy Cold, Common, Adult Cold, Common, Adult, Easy-to-Read Cold, Common, Child Cold, Common, Child, Easy-to-Read Colds and Flu, What to do Contraction Stress Testing Contusions Cough, Bacterial Cough, Generic Cough, Generic, Easy-to-Read Cough, Viral Cough-Brief Cumulative Trauma Disorder Dental Avulsion Diet - Low Cholesterol Guidelines Diet - Low-Fat, Low Saturated Fat, Low Cholesterol Dilation and Curettage, Diagnostic, Care After Dilation or Vacuum Curettage, Miscarriage or Retained Placenta, Care After Dog Bite Dog Bite, Easy-to-Read DT Immunization Dysmenorrhea-Brief Electrocardiography Test Electrocardiography-Brief Electroencephalography Test Electrophysiologic Study Epilepsy-Brief Fetal Contraction Stress Test Fetal Nonstress Test Food Allergies-Brief Hand, Foot, and Mouth Disease-Brief Hypertension in Pregnancy-Brief Hyponatremia-Brief Hysterectomy, Abdominal and Vaginal, Care After Insect Bite or Sting, Infected Insect Sting Allergies Intraoral Laceration-Brief Irukandji Sting Laceration, After Care Low Cholesterol, Low Fat Diet, Easy-to-Read Myelography Neutropenia From Chemotherapy Neutropenia, Causes of Neutropenic Precautions Nonhuman Primate Bite Pain Control  After Surgery, Easy-to-Read Pain, Taking Care of Your Pain at Home, Easy-to-Read Parotitis-Brief Postsurgical Instructions, General, Adult Pregnancy - Nonstress Testing Rectal Bleeding-Brief Repetitive Motion Disorders Safety, Easy-to-Read Scarlet Fever-Brief Shin Splints-Brief Sunburn-Brief Surgical Instructions, Generic Tailbone Injury-Brief Tetanus and Diphtheria Prevention-Brief Tetanus Prevention (Tetanus Toxoid) Tuberculosis-Brief [place], FAQs [place], General Information, Bioterrorism Upper GI and Esophagus Studies Vertigo (Dizziness)-Brief Wound Recheck with Infection Document Released: 11/26/2010 Document Revised: 11/26/2010 Document Reviewed: 11/26/2010 Exit[employer] Patient Information 2012 [employer]. 

## 2011-01-22 NOTE — Pre-Procedure Instructions (Signed)
Pre op teaching done and instructions given verbally and in writing- copy to chart. Verbalizes understanding   L Camera operator

## 2011-01-23 NOTE — H&P (Signed)
[name], [name]            ACCOUNT NO.:  [id number]  MEDICAL RECORD NO.:  [id number]  LOCATION:                               FACILITY:  [facility]  PHYSICIAN:  Madlyn Frankel. Charlann Boxer, M.D.  DATE OF BIRTH:  1950/07/18  DATE OF ADMISSION:  01/28/2011 DATE OF DISCHARGE:                             HISTORY & PHYSICAL   ADMITTING DIAGNOSIS:  Osteoarthritis, right knee.  HISTORY OF PRESENT ILLNESS:  This is a 60 year old gentleman with a history of osteoarthritis of his right knee and left knee with failure of conservative treatment to alleviate his symptoms.  After discussion of treatment, benefits, risks, and options, the patient is now scheduled for total knee arthroplasty of his right knee with followup total knee arthroplasty of his left knee at some point in the future.  The surgery, risks, benefits, and aftercare were discussed in detail.  The patient's questions were invited and answered.  Note that he does have significant medical problems with history of DVT in the past, also history of pancreatitis, insulin-dependent diabetes and chronic pain for which Dr. Thyra Breed treats him.  Due to his continued symptoms, he admits to the hospital at this time for total knee arthroplasty.  Home medicines are not given at this time.  He will probably need further DVT prophylaxis other than the aspirin with a history of DVT.  Medical doctor is Dr. Marjory Lies.  PAST MEDICAL HISTORY:  Drug allergies, to CORTISONE with elevated blood sugar and pancreatitis.  CURRENT MEDICATIONS: 1. Lisinopril 40 mg 1 daily. 2. Metoprolol 100 mg 1 daily. 3. Simvastatin 40 mg daily. 4. Morphine 60 mg t.i.d. 5. Hydrocodone 10/325 1 q.i.d. 6. NovoLog sliding scale. 7. Insulin Lantus 50 units b.i.d. 8. Lidoderm patches p.r.n. 9. Allegra-D 1 daily stool softeners.  PAST MEDICAL HISTORY:  Serious medical illnesses include: 1. Diabetes. 2. Hypertension. 3. Hyperlipidemia. 4. History of  pancreatitis. 5. Sleep apnea though he does not use CPAP. 6. Also, history of DVT. 7. Rheumatoid arthritis. 8. Chronic back pain.  PAST SURGICAL HISTORY:  Previous surgeries include 3 lower back surgeries; left wrist surgery; bilateral knee arthroscopies, bilateral shoulders; cholecystectomy; left thumb and right elbow.  FAMILY HISTORY:  Positive for heart disease and cancer.  SOCIAL HISTORY:  The patient is married.  He is a Air traffic controller.  He does not drink and does not smoke.  He is planning on going home after surgery.  REVIEW OF SYSTEMS:  CENTRAL NERVOUS SYSTEM:  Positive for vertigo. PULMONARY:  Positive for history of asthma and pneumonia and history of sleep apnea for which he does not use CPAP. CARDIOVASCULAR:  Negative for chest pain or palpitation, positive for history of DVT. GI:  Positive for pancreatitis and gallstones. MUSCULOSKELETAL:  Positive as in HPI.  PHYSICAL EXAMINATION:  VITAL SIGNS:  Blood pressure 123/76, respirations 16, pulse 67 and regular. GENERAL APPEARANCE:  This is a well-developed, well-nourished gentleman,in no acute distress. HEENT:  Head normocephalic.  Nose patent.  Ears patent.  Pupils equal, round, and reactive to light.  Throat without injection. NECK:  Supple without adenopathy.  Carotids 2+ without bruit. CHEST:  Clear to auscultation.  No rales or rhonchi.  Respirations 16. HEART:  Regular rate and rhythm at 67 beats per minute without murmur. ABDOMEN:  Soft with active bowel sounds.  No masses, organomegaly. NEUROLOGIC:  The patient is alert and orient to time, place and person. Cranial nerves 2-12 grossly intact. EXTREMITIES:  Shows the knees with varus deformity.  Right knee has painful range of motion.  Neurovascular status intact.  He also has had multiple back surgeries with chronic back pain.  IMPRESSION:  Osteoarthritis, right knee.  PLAN:  Total knee arthroplasty, right knee.     Madlyn Frankel Charlann Boxer,  M.D.     MDO/MEDQ  D:  01/22/2011  T:  01/22/2011  Job:  [id number]  Electronically Signed by Durene Romans M.D. on 01/23/2011 11:18:41 AM

## 2011-01-24 MED ORDER — CHLORHEXIDINE GLUCONATE 4 % EX LIQD: 60.0000 mL | Freq: Once | CUTANEOUS | Status: DC

## 2011-01-28 ENCOUNTER — Encounter (HOSPITAL_COMMUNITY): Payer: Self-pay | Admitting: Anesthesiology

## 2011-01-28 ENCOUNTER — Inpatient Hospital Stay (HOSPITAL_COMMUNITY): Payer: BC Managed Care – PPO | Admitting: Anesthesiology

## 2011-01-28 ENCOUNTER — Inpatient Hospital Stay (HOSPITAL_COMMUNITY)
Admission: RE | Admit: 2011-01-28 | Discharge: 2011-01-30 | DRG: 209 | Disposition: A | Discharge status: Home Health | Payer: BC Managed Care – PPO | Source: Ambulatory Visit | Attending: Orthopedic Surgery | Admitting: Orthopedic Surgery

## 2011-01-28 ENCOUNTER — Encounter (HOSPITAL_COMMUNITY)
Admission: RE | Disposition: A | Discharge status: Home Health | Payer: Self-pay | Source: Ambulatory Visit | Attending: Orthopedic Surgery

## 2011-01-28 ENCOUNTER — Other Ambulatory Visit: Payer: Self-pay

## 2011-01-28 ENCOUNTER — Encounter (HOSPITAL_COMMUNITY): Payer: Self-pay | Admitting: *Deleted

## 2011-01-28 DIAGNOSIS — E785 Hyperlipidemia, unspecified: Secondary | ICD-10-CM | POA: Diagnosis present

## 2011-01-28 DIAGNOSIS — M1710 Unilateral primary osteoarthritis, unspecified knee: Principal | ICD-10-CM | POA: Diagnosis present

## 2011-01-28 DIAGNOSIS — Z96659 Presence of unspecified artificial knee joint: Secondary | ICD-10-CM

## 2011-01-28 DIAGNOSIS — E119 Type 2 diabetes mellitus without complications: Secondary | ICD-10-CM | POA: Diagnosis present

## 2011-01-28 DIAGNOSIS — M549 Dorsalgia, unspecified: Secondary | ICD-10-CM | POA: Diagnosis present

## 2011-01-28 DIAGNOSIS — G8929 Other chronic pain: Secondary | ICD-10-CM | POA: Diagnosis present

## 2011-01-28 DIAGNOSIS — I10 Essential (primary) hypertension: Secondary | ICD-10-CM | POA: Diagnosis present

## 2011-01-28 HISTORY — PX: TOTAL KNEE ARTHROPLASTY: SHX125

## 2011-01-28 LAB — TYPE AND SCREEN
ABO/RH(D): O POS
Antibody Screen: NEGATIVE

## 2011-01-28 LAB — GLUCOSE, CAPILLARY
Glucose-Capillary: 142 mg/dL — ABNORMAL HIGH (ref 70–99)
Glucose-Capillary: 158 mg/dL — ABNORMAL HIGH (ref 70–99)
Glucose-Capillary: 179 mg/dL — ABNORMAL HIGH (ref 70–99)
Glucose-Capillary: 160 mg/dL — ABNORMAL HIGH (ref 70–99)

## 2011-01-28 SURGERY — ARTHROPLASTY, KNEE, TOTAL
Anesthesia: General | Site: Knee | Laterality: Right | Wound class: Clean

## 2011-01-28 MED ORDER — CEFAZOLIN SODIUM 1-5 GM-% IV SOLN
INTRAVENOUS | Status: DC | PRN
  Administered 2011-01-28: 2 g via INTRAVENOUS

## 2011-01-28 MED ORDER — LIDOCAINE 5 % EX PTCH
1.0000 | MEDICATED_PATCH | Freq: Every day | CUTANEOUS | Status: DC
  Administered 2011-01-28 – 2011-01-30 (×3): 1 via TRANSDERMAL
  Filled 2011-01-28 (×3): qty 1

## 2011-01-28 MED ORDER — SODIUM CHLORIDE 0.9 % IV SOLN
INTRAVENOUS | Status: DC
  Administered 2011-01-28 – 2011-01-29 (×3): via INTRAVENOUS
  Filled 2011-01-28 (×14): qty 1000

## 2011-01-28 MED ORDER — DOCUSATE SODIUM 100 MG PO CAPS
100.0000 mg | ORAL_CAPSULE | Freq: Three times a day (TID) | ORAL | Status: DC | PRN
  Administered 2011-01-30: 100 mg via ORAL
  Filled 2011-01-28: qty 1

## 2011-01-28 MED ORDER — ACETAMINOPHEN 325 MG PO TABS: 650.0000 mg | ORAL_TABLET | Freq: Four times a day (QID) | ORAL | Status: DC | PRN

## 2011-01-28 MED ORDER — DOCUSATE SODIUM 100 MG PO CAPS
100.0000 mg | ORAL_CAPSULE | Freq: Two times a day (BID) | ORAL | Status: DC
  Administered 2011-01-28 – 2011-01-29 (×3): 100 mg via ORAL
  Filled 2011-01-28 (×6): qty 1

## 2011-01-28 MED ORDER — CEFAZOLIN SODIUM 1-5 GM-% IV SOLN: 1.0000 g | Freq: Three times a day (TID) | INTRAVENOUS | Status: DC

## 2011-01-28 MED ORDER — DEXTROSE 5 % IV SOLN
500.0000 mg | Freq: Four times a day (QID) | INTRAVENOUS | Status: DC | PRN
  Administered 2011-01-28: 500 mg via INTRAVENOUS
  Filled 2011-01-28 (×3): qty 5

## 2011-01-28 MED ORDER — HYDROMORPHONE HCL PF 1 MG/ML IJ SOLN
INTRAMUSCULAR | Status: DC | PRN
  Administered 2011-01-28 (×2): .5 mg via INTRAVENOUS
  Administered 2011-01-28: 1 mg via INTRAVENOUS

## 2011-01-28 MED ORDER — HYDROMORPHONE HCL PF 1 MG/ML IJ SOLN
0.5000 mg | INTRAMUSCULAR | Status: DC | PRN
  Administered 2011-01-28 (×3): 1 mg via INTRAVENOUS
  Administered 2011-01-29 – 2011-01-30 (×3): 2 mg via INTRAVENOUS
  Filled 2011-01-28: qty 1
  Filled 2011-01-28: qty 2
  Filled 2011-01-28: qty 1
  Filled 2011-01-28: qty 2
  Filled 2011-01-28: qty 1
  Filled 2011-01-28: qty 2
  Filled 2011-01-28: qty 1

## 2011-01-28 MED ORDER — PROMETHAZINE HCL 25 MG/ML IJ SOLN: 6.2500 mg | INTRAMUSCULAR | Status: DC | PRN

## 2011-01-28 MED ORDER — BISACODYL 5 MG PO TBEC: 10.0000 mg | DELAYED_RELEASE_TABLET | Freq: Every day | ORAL | Status: DC | PRN

## 2011-01-28 MED ORDER — HYDROMORPHONE HCL PF 1 MG/ML IJ SOLN
0.2500 mg | INTRAMUSCULAR | Status: DC | PRN
  Administered 2011-01-28 (×4): 0.5 mg via INTRAVENOUS

## 2011-01-28 MED ORDER — LIDOCAINE HCL (CARDIAC) 20 MG/ML IV SOLN
INTRAVENOUS | Status: DC | PRN
  Administered 2011-01-28: 80 mg via INTRAVENOUS

## 2011-01-28 MED ORDER — AZELASTINE HCL 0.1 % NA SOLN
2.0000 | Freq: Two times a day (BID) | NASAL | Status: DC
Start: 2011-01-28 — End: 2011-01-30
  Administered 2011-01-28 – 2011-01-30 (×3): 2 via NASAL
  Filled 2011-01-28: qty 30

## 2011-01-28 MED ORDER — ALUM & MAG HYDROXIDE-SIMETH 200-200-20 MG/5ML PO SUSP: 30.0000 mL | ORAL | Status: DC | PRN

## 2011-01-28 MED ORDER — MORPHINE SULFATE CR 30 MG PO TB12
60.0000 mg | ORAL_TABLET | Freq: Three times a day (TID) | ORAL | Status: DC
  Administered 2011-01-28 – 2011-01-30 (×7): 60 mg via ORAL
  Filled 2011-01-28 (×7): qty 2

## 2011-01-28 MED ORDER — PROPOFOL 10 MG/ML IV EMUL
INTRAVENOUS | Status: DC | PRN
  Administered 2011-01-28: 150 mg via INTRAVENOUS

## 2011-01-28 MED ORDER — METOCLOPRAMIDE HCL 5 MG/ML IJ SOLN: 5.0000 mg | Freq: Three times a day (TID) | INTRAMUSCULAR | Status: DC | PRN

## 2011-01-28 MED ORDER — BISACODYL 10 MG RE SUPP: 10.0000 mg | Freq: Every day | RECTAL | Status: DC | PRN

## 2011-01-28 MED ORDER — FERROUS SULFATE 325 (65 FE) MG PO TABS
325.0000 mg | ORAL_TABLET | Freq: Three times a day (TID) | ORAL | Status: DC
  Administered 2011-01-28 – 2011-01-30 (×6): 325 mg via ORAL
  Filled 2011-01-28 (×8): qty 1

## 2011-01-28 MED ORDER — POLYETHYLENE GLYCOL 3350 17 G PO PACK
17.0000 g | PACK | Freq: Every day | ORAL | Status: DC
  Administered 2011-01-29: 11:00:00 via ORAL
  Filled 2011-01-28 (×3): qty 1

## 2011-01-28 MED ORDER — LORATADINE 10 MG PO TABS
10.0000 mg | ORAL_TABLET | Freq: Every day | ORAL | Status: DC
  Administered 2011-01-29 – 2011-01-30 (×2): 10 mg via ORAL
  Filled 2011-01-28 (×3): qty 1

## 2011-01-28 MED ORDER — MAGNESIUM HYDROXIDE 400 MG/5ML PO SUSP: 30.0000 mL | Freq: Two times a day (BID) | ORAL | Status: DC | PRN

## 2011-01-28 MED ORDER — KETOROLAC TROMETHAMINE 30 MG/ML IJ SOLN
INTRAMUSCULAR | Status: DC | PRN
  Administered 2011-01-28: 30 mg

## 2011-01-28 MED ORDER — INSULIN ASPART 100 UNIT/ML ~~LOC~~ SOLN
3.0000 [IU] | Freq: Three times a day (TID) | SUBCUTANEOUS | Status: DC
  Filled 2011-01-28: qty 3

## 2011-01-28 MED ORDER — FENTANYL CITRATE 0.05 MG/ML IJ SOLN
INTRAMUSCULAR | Status: DC | PRN
  Administered 2011-01-28: 50 ug via INTRAVENOUS
  Administered 2011-01-28: 100 ug via INTRAVENOUS
  Administered 2011-01-28 (×2): 50 ug via INTRAVENOUS

## 2011-01-28 MED ORDER — ZOLPIDEM TARTRATE 5 MG PO TABS: 5.0000 mg | ORAL_TABLET | Freq: Every evening | ORAL | Status: DC | PRN

## 2011-01-28 MED ORDER — METOCLOPRAMIDE HCL 10 MG PO TABS: 5.0000 mg | ORAL_TABLET | Freq: Three times a day (TID) | ORAL | Status: DC | PRN

## 2011-01-28 MED ORDER — BUPIVACAINE-EPINEPHRINE PF 0.25-1:200000 % IJ SOLN
INTRAMUSCULAR | Status: DC | PRN
  Administered 2011-01-28: 50 mL

## 2011-01-28 MED ORDER — FLEET ENEMA 7-19 GM/118ML RE ENEM: 1.0000 | ENEMA | Freq: Every day | RECTAL | Status: DC | PRN

## 2011-01-28 MED ORDER — SIMVASTATIN 40 MG PO TABS
40.0000 mg | ORAL_TABLET | Freq: Every day | ORAL | Status: DC
  Administered 2011-01-28 – 2011-01-29 (×2): 40 mg via ORAL
  Filled 2011-01-28 (×3): qty 1

## 2011-01-28 MED ORDER — ONDANSETRON HCL 4 MG/2ML IJ SOLN
INTRAMUSCULAR | Status: DC | PRN
  Administered 2011-01-28: 4 mg via INTRAVENOUS

## 2011-01-28 MED ORDER — ONDANSETRON HCL 4 MG PO TABS: 4.0000 mg | ORAL_TABLET | Freq: Four times a day (QID) | ORAL | Status: DC | PRN

## 2011-01-28 MED ORDER — ACETAMINOPHEN 650 MG RE SUPP: 650.0000 mg | Freq: Four times a day (QID) | RECTAL | Status: DC | PRN

## 2011-01-28 MED ORDER — CEFAZOLIN SODIUM 1-5 GM-% IV SOLN
1.0000 g | Freq: Four times a day (QID) | INTRAVENOUS | Status: DC
  Administered 2011-01-28 – 2011-01-29 (×3): 1 g via INTRAVENOUS
  Filled 2011-01-28 (×7): qty 50

## 2011-01-28 MED ORDER — FEXOFENADINE-PSEUDOEPHED ER 60-120 MG PO TB12: 1.0000 | ORAL_TABLET | Freq: Two times a day (BID) | ORAL | Status: DC

## 2011-01-28 MED ORDER — ALUMINUM HYDROXIDE GEL 600 MG/5ML PO SUSP: 15.0000 mL | ORAL | Status: DC | PRN

## 2011-01-28 MED ORDER — LACTATED RINGERS IV SOLN
INTRAVENOUS | Status: DC | PRN
  Administered 2011-01-28 (×2): via INTRAVENOUS

## 2011-01-28 MED ORDER — RIVAROXABAN 10 MG PO TABS
10.0000 mg | ORAL_TABLET | ORAL | Status: DC
  Administered 2011-01-29 – 2011-01-30 (×2): 10 mg via ORAL
  Filled 2011-01-28 (×2): qty 1

## 2011-01-28 MED ORDER — PHENOL 1.4 % MT LIQD: 1.0000 | OROMUCOSAL | Status: DC | PRN

## 2011-01-28 MED ORDER — ALBUTEROL SULFATE HFA 108 (90 BASE) MCG/ACT IN AERS
2.0000 | INHALATION_SPRAY | RESPIRATORY_TRACT | Status: DC | PRN
  Filled 2011-01-28: qty 6.7

## 2011-01-28 MED ORDER — ONDANSETRON HCL 4 MG/2ML IJ SOLN: 4.0000 mg | Freq: Four times a day (QID) | INTRAMUSCULAR | Status: DC | PRN

## 2011-01-28 MED ORDER — INSULIN ASPART 100 UNIT/ML ~~LOC~~ SOLN
0.0000 [IU] | Freq: Three times a day (TID) | SUBCUTANEOUS | Status: DC
  Administered 2011-01-28: 3 [IU] via SUBCUTANEOUS
  Administered 2011-01-29: 2 [IU] via SUBCUTANEOUS
  Administered 2011-01-29: 5 [IU] via SUBCUTANEOUS
  Administered 2011-01-29 – 2011-01-30 (×3): 3 [IU] via SUBCUTANEOUS
  Filled 2011-01-28: qty 3

## 2011-01-28 MED ORDER — MENTHOL 3 MG MT LOZG: 1.0000 | LOZENGE | OROMUCOSAL | Status: DC | PRN

## 2011-01-28 MED ORDER — SODIUM CHLORIDE 0.9 % IR SOLN
Status: DC | PRN
  Administered 2011-01-28: 3000 mL

## 2011-01-28 MED ORDER — LISINOPRIL 40 MG PO TABS
40.0000 mg | ORAL_TABLET | ORAL | Status: DC
  Administered 2011-01-29 – 2011-01-30 (×2): 40 mg via ORAL
  Filled 2011-01-28 (×3): qty 1

## 2011-01-28 MED ORDER — ACETAMINOPHEN 10 MG/ML IV SOLN
INTRAVENOUS | Status: DC | PRN
  Administered 2011-01-28: 1000 mg via INTRAVENOUS

## 2011-01-28 MED ORDER — MIDAZOLAM HCL 5 MG/5ML IJ SOLN
INTRAMUSCULAR | Status: DC | PRN
  Administered 2011-01-28: 2 mg via INTRAVENOUS

## 2011-01-28 MED ORDER — ROCURONIUM BROMIDE 100 MG/10ML IV SOLN
INTRAVENOUS | Status: DC | PRN
  Administered 2011-01-28: 40 mg via INTRAVENOUS

## 2011-01-28 MED ORDER — POLYETHYLENE GLYCOL 3350 17 G PO PACK
17.0000 g | PACK | Freq: Every day | ORAL | Status: DC | PRN
  Filled 2011-01-28: qty 1

## 2011-01-28 MED ORDER — SODIUM CHLORIDE 0.9 % IR SOLN
Status: DC | PRN
  Administered 2011-01-28: 1000 mL

## 2011-01-28 MED ORDER — METOPROLOL TARTRATE 100 MG PO TABS
100.0000 mg | ORAL_TABLET | ORAL | Status: DC
  Administered 2011-01-29 – 2011-01-30 (×2): 100 mg via ORAL
  Filled 2011-01-28 (×3): qty 1

## 2011-01-28 MED ORDER — METHOCARBAMOL 500 MG PO TABS
500.0000 mg | ORAL_TABLET | Freq: Four times a day (QID) | ORAL | Status: DC | PRN
  Administered 2011-01-28 – 2011-01-30 (×6): 500 mg via ORAL
  Filled 2011-01-28 (×7): qty 1

## 2011-01-28 MED ORDER — PSEUDOEPHEDRINE HCL ER 120 MG PO TB12
120.0000 mg | ORAL_TABLET | Freq: Two times a day (BID) | ORAL | Status: DC
  Administered 2011-01-28 – 2011-01-30 (×4): 120 mg via ORAL
  Filled 2011-01-28 (×6): qty 1

## 2011-01-28 MED ORDER — INSULIN GLARGINE 100 UNIT/ML ~~LOC~~ SOLN
50.0000 [IU] | Freq: Two times a day (BID) | SUBCUTANEOUS | Status: DC
  Administered 2011-01-28 – 2011-01-30 (×4): 50 [IU] via SUBCUTANEOUS
  Filled 2011-01-28: qty 3

## 2011-01-28 MED ORDER — OXYCODONE-ACETAMINOPHEN 5-325 MG PO TABS
1.0000 | ORAL_TABLET | ORAL | Status: DC | PRN
  Administered 2011-01-28 (×2): 2 via ORAL
  Administered 2011-01-29: 3 via ORAL
  Administered 2011-01-29: 2 via ORAL
  Administered 2011-01-29: 1 via ORAL
  Administered 2011-01-29 (×2): 3 via ORAL
  Filled 2011-01-28: qty 2
  Filled 2011-01-28 (×2): qty 3
  Filled 2011-01-28 (×2): qty 2
  Filled 2011-01-28: qty 3
  Filled 2011-01-28: qty 2

## 2011-01-28 MED ORDER — METOCLOPRAMIDE HCL 5 MG/ML IJ SOLN
INTRAMUSCULAR | Status: DC | PRN
  Administered 2011-01-28: 10 mg via INTRAVENOUS

## 2011-01-28 SURGICAL SUPPLY — 69 items
ADH SKN CLS APL DERMABOND .7 (GAUZE/BANDAGES/DRESSINGS) ×1
BAG SPEC THK2 15X12 ZIP CLS (MISCELLANEOUS) ×1
BAG ZIPLOCK 12X15 (MISCELLANEOUS) ×2 IMPLANT
BANDAGE ELASTIC 6 VELCRO ST LF (GAUZE/BANDAGES/DRESSINGS) ×2 IMPLANT
BANDAGE ESMARK 6X9 LF (GAUZE/BANDAGES/DRESSINGS) ×1 IMPLANT
BLADE SAW SGTL 13.0X1.19X90.0M (BLADE) ×2 IMPLANT
BNDG CMPR 9X6 STRL LF SNTH (GAUZE/BANDAGES/DRESSINGS) ×1
BNDG ESMARK 6X9 LF (GAUZE/BANDAGES/DRESSINGS) ×2
BONE CEMENT GENTAMICIN (Cement) ×4 IMPLANT
BOWL SMART MIX CTS (DISPOSABLE) ×2 IMPLANT
CEMENT BONE GENTAMICIN 40 (Cement) IMPLANT
CEMENT TIBIA MBT SIZE 5 (Knees) IMPLANT
CLOTH BEACON ORANGE TIMEOUT ST (SAFETY) ×2 IMPLANT
CUFF TOURN SGL QUICK 34 (TOURNIQUET CUFF) ×2
CUFF TRNQT CYL 34X4X40X1 (TOURNIQUET CUFF) ×1 IMPLANT
DECANTER SPIKE VIAL GLASS SM (MISCELLANEOUS) ×2 IMPLANT
DERMABOND ADVANCED (GAUZE/BANDAGES/DRESSINGS) ×1
DERMABOND ADVANCED .7 DNX12 (GAUZE/BANDAGES/DRESSINGS) ×1 IMPLANT
DRAPE EXTREMITY T 121X128X90 (DRAPE) ×2 IMPLANT
DRAPE POUCH INSTRU U-SHP 10X18 (DRAPES) ×2 IMPLANT
DRAPE U-SHAPE 47X51 STRL (DRAPES) ×2 IMPLANT
DRSG AQUACEL AG ADV 3.5X10 (GAUZE/BANDAGES/DRESSINGS) ×2 IMPLANT
DRSG TEGADERM 4X4.75 (GAUZE/BANDAGES/DRESSINGS) ×2 IMPLANT
DURAPREP 26ML APPLICATOR (WOUND CARE) ×2 IMPLANT
ELECT REM PT RETURN 9FT ADLT (ELECTROSURGICAL) ×2
ELECTRODE REM PT RTRN 9FT ADLT (ELECTROSURGICAL) ×1 IMPLANT
EVACUATOR 1/8 PVC DRAIN (DRAIN) ×2 IMPLANT
FACESHIELD LNG OPTICON STERILE (SAFETY) ×10 IMPLANT
FEMUR SIGMA PS SZ 5.0 R (Femur) ×1 IMPLANT
GAUZE SPONGE 2X2 8PLY STRL LF (GAUZE/BANDAGES/DRESSINGS) ×1 IMPLANT
GLOVE BIOGEL PI IND STRL 7.5 (GLOVE) ×1 IMPLANT
GLOVE BIOGEL PI IND STRL 8 (GLOVE) ×1 IMPLANT
GLOVE BIOGEL PI IND STRL 8.5 (GLOVE) IMPLANT
GLOVE BIOGEL PI INDICATOR 7.5 (GLOVE) ×1
GLOVE BIOGEL PI INDICATOR 8 (GLOVE) ×1
GLOVE BIOGEL PI INDICATOR 8.5 (GLOVE)
GLOVE ECLIPSE 8.0 STRL XLNG CF (GLOVE) ×2 IMPLANT
GLOVE ORTHO TXT STRL SZ7.5 (GLOVE) ×4 IMPLANT
GOWN PREVENTION PLUS XLARGE (GOWN DISPOSABLE) ×1 IMPLANT
GOWN PREVENTION PLUS XXLARGE (GOWN DISPOSABLE) ×1 IMPLANT
GOWN STRL NON-REIN LRG LVL3 (GOWN DISPOSABLE) ×2 IMPLANT
HANDPIECE INTERPULSE COAX TIP (DISPOSABLE) ×2
IMMOBILIZER KNEE 20 (SOFTGOODS) ×2
IMMOBILIZER KNEE 20 THIGH 36 (SOFTGOODS) IMPLANT
KIT BASIN OR (CUSTOM PROCEDURE TRAY) ×2 IMPLANT
MANIFOLD NEPTUNE II (INSTRUMENTS) ×2 IMPLANT
NDL SAFETY ECLIPSE 18X1.5 (NEEDLE) ×1 IMPLANT
NEEDLE HYPO 18GX1.5 SHARP (NEEDLE) ×2
NS IRRIG 1000ML POUR BTL (IV SOLUTION) ×4 IMPLANT
PACK TOTAL JOINT (CUSTOM PROCEDURE TRAY) ×2 IMPLANT
PATELLA DOME PFC 41MM (Knees) ×1 IMPLANT
PLATE ROT INSERT 12.5MM (Plate) ×1 IMPLANT
POSITIONER SURGICAL ARM (MISCELLANEOUS) ×2 IMPLANT
SET HNDPC FAN SPRY TIP SCT (DISPOSABLE) ×1 IMPLANT
SET PAD KNEE POSITIONER (MISCELLANEOUS) ×2 IMPLANT
SPONGE GAUZE 2X2 8PLY STRL LF (GAUZE/BANDAGES/DRESSINGS) ×1 IMPLANT
SPONGE GAUZE 2X2 STER 10/PKG (GAUZE/BANDAGES/DRESSINGS) ×1
SUCTION FRAZIER 12FR DISP (SUCTIONS) ×2 IMPLANT
SUT MNCRL AB 4-0 PS2 18 (SUTURE) ×2 IMPLANT
SUT VIC AB 1 CT1 36 (SUTURE) ×6 IMPLANT
SUT VIC AB 2-0 CT1 27 (SUTURE) ×6
SUT VIC AB 2-0 CT1 TAPERPNT 27 (SUTURE) ×3 IMPLANT
SYR 50ML LL SCALE MARK (SYRINGE) ×2 IMPLANT
TIBIA MBT CEMENT SIZE 5 (Knees) ×2 IMPLANT
TOWEL OR 17X26 10 PK STRL BLUE (TOWEL DISPOSABLE) ×4 IMPLANT
TOWEL OR NON WOVEN STRL DISP B (DISPOSABLE) ×1 IMPLANT
TRAY FOLEY CATH 14FRSI W/METER (CATHETERS) ×2 IMPLANT
WATER STERILE IRR 1500ML POUR (IV SOLUTION) ×2 IMPLANT
YANKAUER SUCT BULB TIP NO VENT (SUCTIONS) ×1 IMPLANT

## 2011-01-28 NOTE — Anesthesia Preprocedure Evaluation (Addendum)
Anesthesia Evaluation  Patient identified by MRN, date of birth, ID band Patient awake    Reviewed: Allergy & Precautions, H&P , NPO status , Patient's Chart, lab work & pertinent test results  History of Anesthesia Complications Negative for: history of anesthetic complications  Airway Mallampati: III TM Distance: >3 FB Neck ROM: Full    Dental  (+) Edentulous Upper, Poor Dentition and Dental Advisory Given   Pulmonary asthma , sleep apnea , pneumonia ,  clear to auscultation        Cardiovascular hypertension, Pt. on medications     Neuro/Psych PSYCHIATRIC DISORDERS Negative Neurological ROS     GI/Hepatic Neg liver ROS, GERD-  Controlled,  Endo/Other  Diabetes mellitus-Morbid obesity  Renal/GU negative Renal ROS     Musculoskeletal negative musculoskeletal ROS (+)   Abdominal Normal abdominal exam  (+) obese,   Peds  Hematology negative hematology ROS (+)   Anesthesia Other Findings   Reproductive/Obstetrics                          Anesthesia Physical Anesthesia Plan  ASA: III  Anesthesia Plan: General   Post-op Pain Management:    Induction: Intravenous  Airway Management Planned: Oral ETT  Additional Equipment:   Intra-op Plan:   Post-operative Plan: Extubation in OR  Informed Consent: I have reviewed the patients History and Physical, chart, labs and discussed the procedure including the risks, benefits and alternatives for the proposed anesthesia with the patient or authorized representative who has indicated his/her understanding and acceptance.   Dental advisory given  Plan Discussed with: CRNA and Surgeon  Anesthesia Plan Comments:       Anesthesia Quick Evaluation

## 2011-01-28 NOTE — Transfer of Care (Signed)
Immediate Anesthesia Transfer of Care Note  Patient: [name]  Procedure(s) Performed:  TOTAL KNEE ARTHROPLASTY - femoral nerve block preop.  Patient Location: PACU  Anesthesia Type: General  Level of Consciousness: awake  Airway & Oxygen Therapy: Patient Spontanous Breathing and Patient connected to face mask  Post-op Assessment: Report given to PACU RN and Post -op Vital signs reviewed and stable  Post vital signs: stable  Complications: No apparent anesthesia complications

## 2011-01-28 NOTE — Progress Notes (Signed)
Pt arrived from PACU in bed, A&Ox3.  Oriented to callbell and environment. Assessment as charted. Dsg c/d/i. No c/o at present.

## 2011-01-28 NOTE — Preoperative (Signed)
Beta Blockers   Reason not to administer Beta Blockers:Pt took metoprolol this am

## 2011-01-28 NOTE — Progress Notes (Signed)
Pt has had of drainage out of his hemovac since 11am w/ since 3pm. VSS. Dr Charlann Boxer aware and states to keep hemovac charged and continue to monitor.

## 2011-01-28 NOTE — Interval H&P Note (Signed)
History and Physical Interval Note:   01/28/2011   7:34 AM   [name]  has presented today for surgery, with the diagnosis of osteoarthritis right knee   The various methods of treatment have been discussed with the patient and family. After consideration of risks, benefits and other options for treatment, the patient has consented to  Procedure(s): TOTAL KNEE ARTHROPLASTY as a surgical intervention .  The patients' history has been reviewed, patient examined, no change in status, stable for surgery.  I have reviewed the patients' chart and labs.  Questions were answered to the patient's satisfaction.     Shelda Pal  MD       History and Physical Interval Note:   01/28/2011   7:34 AM   [name]  has presented today for surgery, with the diagnosis of osteoarthritis right knee   The various methods of treatment have been discussed with the patient and family. After consideration of risks, benefits and other options for treatment, the patient has consented to  Procedure(s): TOTAL KNEE ARTHROPLASTY as a surgical intervention .  The patients' history has been reviewed, patient examined, no change in status, stable for surgery.  I have reviewed the patients' chart and labs.  Questions were answered to the patient's satisfaction.     Shelda Pal  MD

## 2011-01-28 NOTE — Anesthesia Postprocedure Evaluation (Signed)
  Anesthesia Post-op Note  Patient: [name]  Procedure(s) Performed:  TOTAL KNEE ARTHROPLASTY - femoral nerve block preop.  Patient Location: PACU  Anesthesia Type: General  Level of Consciousness: awake, sedated and patient cooperative  Airway and Oxygen Therapy: Patient Spontanous Breathing and Patient connected to nasal cannula oxygen  Post-op Pain: mild  Post-op Assessment: Post-op Vital signs reviewed, Patient's Cardiovascular Status Stable, Respiratory Function Stable, Patent Airway, No signs of Nausea or vomiting and Pain level controlled  Post-op Vital Signs: Reviewed and stable  Complications: No apparent anesthesia complications

## 2011-01-28 NOTE — Anesthesia Procedure Notes (Addendum)
Regional Block:  Femoral nerve block  Pre-Anesthetic Checklist: ,, timeout performed, Correct Patient, Correct Site, Correct Laterality, Correct Procedure, Correct Position, site marked, Risks and benefits discussed,  Surgical consent,  Pre-op evaluation,  At surgeon's request and post-op pain management   Prep: chloraprep       Needles:  Injection technique: Single-shot  Needle Type: Stimiplex          Additional Needles:  Procedures: ultrasound guided Femoral nerve block Narrative:  Start time: 01/28/2011 7:25 AM End time: 01/28/2011 7:35 AM  Performed by: Personally   Additional Notes: Patient tolerated the procedure well without complications  Femoral nerve block

## 2011-01-28 NOTE — Op Note (Signed)
Pre-operative diagnosis- Osteoarthritis  Right knee(s)  Post-operative diagnosis- Osteoarthritis Right knee(s)  Surgeon- Madlyn Frankel. Charlann Boxer, MD  Assistant- Lanney Gins, PA-C   Anesthesia-  General and Regional  EBL: Total I/O In: 1000 [I.V.:1000] Out: 150 [Urine:150]  Drains:  One medium Hemovac  Tourniquet time:  Total Tourniquet Time Documented: Thigh (Right) - 37 minutes Complications- None  PATIENT DISPOSITION:  PACU - hemodynamically stable.  Brief Clinical Note Brief Clinical Note  [name] is a 60 y.o. year old male with end stage OA of his right knee with progressively worsening pain and dysfunction. He has constant pain, with activity and at rest and significant functional deficits with difficulties even with ADLs. He has had extensive non-op management including analgesics, injections of cortisone and viscosupplements, and home exercise program, but remains in significant pain with significant dysfunction. He presents now for right Total Knee Arthroplasty.     Procedure in detail---   The patient is brought into the operating room and positioned supine on the operating table. After successful administration of  General and Regional,   a tourniquet is placed high on the  Right thigh(s) and the lower extremity is prepped and draped in the usual sterile fashion. Time out is performed by the operating team and then the  Right lower extremity is wrapped in Esmarch, knee flexed and the tourniquet inflated to 300 mmHg.       A midline incision is made with a ten blade through the subcutaneous tissue to the level of the extensor mechanism. A fresh blade is used to make a medial parapatellar arthrotomy. Soft tissue over the proximal medial tibia is subperiosteally elevated to the joint line with a knife and into the semimembranosus bursa with a Cobb elevator. Soft tissue over the proximal lateral tibia is elevated with attention being paid to avoiding the patellar tendon on the  tibial tubercle. The patella is everted, knee flexed 90 degrees and the ACL and PCL are removed. Findings are bone on bone arthritis involving all three compartments.        The drill is used to create a starting hole in the distal femur and the canal is thoroughly irrigated with sterile saline to remove the fatty contents. The 5 degree Right  valgus alignment guide is placed into the femoral canal and the distal femoral cutting block is pinned to remove 10 mm off the distal femur. Resection is made with an oscillating saw.      The tibia is subluxed forward and the menisci are removed. The extramedullary alignment guide is placed referencing proximally at the medial aspect of the tibial tubercle and distally along the second metatarsal axis and tibial crest. The block is pinned to remove 10mm off the lateral side. Resection is made with an oscillating saw. Size 5is the most appropriate size for the tibia and the proximal tibia is prepared with the modular drill and keel punch for that size.      The femoral sizing guide is placed and size 5 is most appropriate. Rotation is marked off the epicondylar axis and confirmed by creating a rectangular flexion gap at 90 degrees. The size 5 cutting block is pinned in this rotation and the anterior, posterior and chamfer cuts are made with the oscillating saw. The intercondylar block is then placed and that cut is made.      Trial size 5 tibial component, trial size 5 posterior stabilized femur and a 12.11mm posterior stabilized rotating platform insert trial is placed. Full  extension is achieved with excellent varus/valgus and anterior/posterior balance throughout full range of motion. The patella is everted and thickness measured to be 26 mm. Free hand resection is taken to 14 mm, a 41template is placed, lug holes are drilled, trial patella is placed, and it tracks normally. Osteophytes are removed off the posterior femur with the trial in place. All trials are removed  and the cut bone surfaces prepared with pulsatile lavage. Cement is mixed and once ready for implantation, the size 5 tibial implant, size 5 posterior stabilized femoral component, and the size 41patella are cemented in place and the patella is held with the clamp. The trial insert is placed and the knee held in full extension. All extruded cement is removed and once the cement is hard the permanent 12.5 mm posterior stabilized rotating platform insert is placed into the tibial tray.      The wound is copiously irrigated with saline solution and the extensor mechanism closed over a medium hemovac drain with #1 vicryl with the knee in flexion. The tourniquet is released for a total tourniquet time of 37  minutes. Flexion against gravity is 135 degrees and the patella tracks normally. Subcutaneous tissue is closed with 2.0 vicryl and subcuticular with running 4.0 Monocryl. The catheter for the Marcaine pain pump is placed and the pump is initiated. The incision is cleaned and dried and steri-strips and a bulky sterile dressing are applied. The limb is placed into a knee immobilizer and the patient is awakened and transported to recovery in stable condition.      Please note that a surgical assistant was a medical necessity for this procedure in order to perform it in a safe and expeditious manner. Surgical assistant was necessary to retract the ligaments and vital neurovascular structures to prevent injury to them and also necessary for proper positioning of the limb to allow for anatomic placement of the prosthesis.

## 2011-01-29 LAB — CBC
MCH: 29.8 pg (ref 26.0–34.0)
MCV: 88.3 fL (ref 78.0–100.0)
Platelets: 164 10*3/uL (ref 150–400)
RDW: 12.4 % (ref 11.5–15.5)

## 2011-01-29 LAB — GLUCOSE, CAPILLARY: Glucose-Capillary: 197 mg/dL — ABNORMAL HIGH (ref 70–99)

## 2011-01-29 LAB — BASIC METABOLIC PANEL
Calcium: 8.3 mg/dL — ABNORMAL LOW (ref 8.4–10.5)
Creatinine, Ser: 1.04 mg/dL (ref 0.50–1.35)
GFR calc Af Amer: 90 mL/min — ABNORMAL LOW (ref >90)
GFR calc non Af Amer: 77 mL/min — ABNORMAL LOW (ref >90)
Sodium: 134 mEq/L — ABNORMAL LOW (ref 135–145)

## 2011-01-29 MED ORDER — CLONIDINE HCL 0.1 MG PO TABS
0.1000 mg | ORAL_TABLET | Freq: Two times a day (BID) | ORAL | Status: DC | PRN
  Administered 2011-01-29: 0.1 mg via ORAL
  Filled 2011-01-29: qty 1

## 2011-01-29 MED ORDER — KETOROLAC TROMETHAMINE 15 MG/ML IJ SOLN
15.0000 mg | Freq: Once | INTRAMUSCULAR | Status: AC
  Administered 2011-01-29: 15 mg via INTRAVENOUS
  Filled 2011-01-29: qty 1

## 2011-01-29 MED ORDER — KETOROLAC TROMETHAMINE 15 MG/ML IJ SOLN
7.5000 mg | Freq: Four times a day (QID) | INTRAMUSCULAR | Status: DC
  Administered 2011-01-29 – 2011-01-30 (×3): 7.5 mg via INTRAVENOUS
  Filled 2011-01-29 (×7): qty 1

## 2011-01-29 NOTE — Progress Notes (Signed)
Pts Bp increased to 178/83, hr 91, pt has complained of unrelieved pain (pt w/chronic pain at home). Notified PA, orders recd for Toradol to decrease pain. Will cont to assess pts pain and bp status.

## 2011-01-29 NOTE — Progress Notes (Addendum)
Physical Therapy Evaluation Patient Details Name: [name] MRN: [id number] DOB: [birth year] Today's Date: 01/29/2011 [id number], eval 2 Problem List:  Patient Active Problem List  Diagnoses  . DIABETES MELLITUS, TYPE II, UNCONTROLLED  . HYPERLIPIDEMIA  . ANEMIA-IRON DEFICIENCY  . HYPERTENSION  . GALLSTONE PANCREATITIS  . ANEMIA, HX OF  . ERECTILE DYSFUNCTION, ORGANIC  . Herpes simplex type 2 infection    Past Medical History:  Past Medical History  Diagnosis Date  . Hyperlipidemia   . Diabetes mellitus   . Gallstone pancreatitis   . Asthma   . Pneumonia     2010  . Hepatitis     pancreatitis 2010  . Peripheral vascular disease     states after arthroscopy left and right was given blood thinners- had symptoms of blood clot but wasnt diagnosed  . Sleep apnea     study years ago/ no cpap/ states mild  . GERD (gastroesophageal reflux disease)   . Arthritis   . Hypertension     eccho 6/10, EKG 1/11, clearence Dr Caryl Never from 11/28/10 on chart   Past Surgical History:  Past Surgical History  Procedure Date  . Cholecystectomy   . Back surgery     lumbar fusion S1-L3  . Colon surgery     removal of part of small intestine 1979    PT Assessment/Plan/Recommendation PT Assessment Clinical Impression Statement: Pt will benefit from PT to maximize I for home. PT Recommendation/Assessment: Patient will need skilled PT in the acute care venue PT Problem List: Decreased strength;Decreased range of motion;Decreased activity tolerance;Decreased mobility;Pain Barriers to Discharge: None PT Therapy Diagnosis : Difficulty walking;Acute pain PT Plan PT Frequency: 7X/week PT Treatment/Interventions: DME instruction;Gait training;Stair training;Functional mobility training;Therapeutic exercise;Balance training;Patient/family education PT Recommendation Follow Up Recommendations: Home health PT Equipment Recommended: None recommended by PT PT Goals  Acute Rehab PT  Goals PT Goal Formulation: With patient Time For Goal Achievement: 5 days Pt will go Supine/Side to Sit: with supervision PT Goal: Supine/Side to Sit - Progress: Other (comment) Pt will Transfer Sit to Stand/Stand to Sit: with supervision PT Transfer Goal: Sit to Stand/Stand to Sit - Progress: Other (comment) Pt will Ambulate: 51 - 150 feet;with rolling walker;with supervision PT Goal: Ambulate - Progress: Other (comment) Pt will Go Up / Down Stairs: 3-5 stairs;with least restrictive assistive device;with min assist PT Goal: Up/Down Stairs - Progress: Other (comment) Pt will Perform Home Exercise Program: with supervision, verbal cues required/provided  PT Evaluation Precautions/Restrictions  Precautions Precautions: Knee Precaution Comments: left Dr. Charlann Boxer sticky note regarding need for KI?? Pt states he said he would have a KI, no orders. Restrictions RLE Weight Bearing: Weight bearing as tolerated Prior Functioning  Home Living Lives With: Spouse Type of Home: House Home Layout: One level;Two level Alternate Level Stairs-Rails: Right;Left (pt has been scooting up/down stairs) Alternate Level Stairs-Number of Steps: 14 Home Access: Stairs to enter Entrance Stairs-Rails: None Entrance Stairs-Number of Steps: 5 Home Adaptive Equipment: Walker - rolling;Wheelchair - manual;Walker - standard;Hand-held shower hose Prior Function Level of Independence: Independent with transfers Driving: No Cognition Cognition Arousal/Alertness: Awake/alert Overall Cognitive Status: Appears within functional limits for tasks assessed Sensation/Coordination   Extremity Assessment RLE Strength Right Hip Flexion: 2/5 Right Knee Extension: 2/5 Right Ankle Dorsiflexion: 4/5 Right Ankle Plantar Flexion: 4/5 LLE Assessment LLE Assessment: Within Functional Limits (pt states left knee is "bad") Mobility (including Balance) Bed Mobility Bed Mobility: Yes Supine to Sit: 4: Min assist Supine to Sit  Details (indicate cue type and  reason): verbal cues to scoot, min assist with RLE Transfers Transfers: Yes Sit to Stand: 4: Min assist Sit to Stand Details (indicate cue type and reason): verbal cues for hand placement Stand to Sit: 4: Min assist Stand to Sit Details: same as above; VCs RLE position Ambulation/Gait Ambulation/Gait: Yes Ambulation/Gait Assistance: 4: Min assist Ambulation/Gait Assistance Details (indicate cue type and reason): verbal cues for RW distance from self Ambulation Distance (Feet): 50 Feet Assistive device: Rolling walker Gait Pattern: Step-to pattern;Antalgic    Exercise  Total Joint Exercises Ankle Circles/Pumps: AROM;Both;10 reps Quad Sets: AROM;Right;5 reps End of Session PT - End of Session Equipment Utilized During Treatment: Gait belt Activity Tolerance: Patient tolerated treatment well Patient left: in chair;with call bell in reach General Behavior During Session: [facility] for tasks performed Cognition: [facility] for tasks performed  [facility] 01/29/2011, 9:45 AM

## 2011-01-29 NOTE — Progress Notes (Signed)
Pts bp 180/90, pt states pain is at a controlled level at this time. Notified PA re BP. Orders recd. Will cont to monitor pt.

## 2011-01-29 NOTE — Progress Notes (Signed)
Patient talkative, telling stories about past health crises and family.  He knew me from one his sister died here in 08-06-07. I supported the family. He is one of ten children in his family.  A friend, Deniece Portela, was also in the room.  Patient loves to garden and do woodworking.  Presence, prayer, listening and support.

## 2011-01-29 NOTE — Progress Notes (Signed)
Occupational Therapy Evaluation Patient Details Name: [name] MRN: [id number] DOB: [birth year] Today's Date: 01/29/2011 Time: 12:35-13:04 Problem List:  Patient Active Problem List  Diagnoses  . DIABETES MELLITUS, TYPE II, UNCONTROLLED  . HYPERLIPIDEMIA  . ANEMIA-IRON DEFICIENCY  . HYPERTENSION  . GALLSTONE PANCREATITIS  . ANEMIA, HX OF  . ERECTILE DYSFUNCTION, ORGANIC  . Herpes simplex type 2 infection    Past Medical History:  Past Medical History  Diagnosis Date  . Hyperlipidemia   . Diabetes mellitus   . Gallstone pancreatitis   . Asthma   . Pneumonia     2010  . Hepatitis     pancreatitis 2010  . Peripheral vascular disease     states after arthroscopy left and right was given blood thinners- had symptoms of blood clot but wasnt diagnosed  . Sleep apnea     study years ago/ no cpap/ states mild  . GERD (gastroesophageal reflux disease)   . Arthritis   . Hypertension     eccho 6/10, EKG 1/11, clearence Dr Caryl Never from 11/28/10 on chart   Past Surgical History:  Past Surgical History  Procedure Date  . Cholecystectomy   . Back surgery     lumbar fusion S1-L3  . Colon surgery     removal of part of small intestine 1979    OT Assessment/Plan/Recommendation OT Assessment Clinical Impression Statement: Pt with pain as limiting factor; will need 3:1 at d/c and min assist w/ ADL/Selfcare tasks (Pt plans for spouse to assist PRN). OT Recommendation/Assessment: Patient will need skilled OT in the acute care venue OT Problem List: Decreased activity tolerance;Decreased knowledge of use of DME or AE OT Therapy Diagnosis : Acute pain;Other (comment) (decreased functional mobility) OT Plan OT Frequency: Min 1X/week OT Treatment/Interventions: Self-care/ADL training;DME and/or AE instruction;Therapeutic activities;Patient/family education OT Recommendation Follow Up Recommendations: Other (comment) (3:1) Equipment Recommended: 3 in 1 bedside  comode Individuals Consulted Consulted and Agree with Results and Recommendations: Patient OT Goals Acute Rehab OT Goals OT Goal Formulation: With patient ADL Goals Pt Will Perform Lower Body Bathing: with min assist;with caregiver independent in assisting;Sitting, edge of bed;Sitting at sink;with adaptive equipment ADL Goal: Lower Body Bathing - Progress: Progressing toward goals Pt Will Perform Lower Body Dressing: with min assist;with adaptive equipment;Sitting, chair;Sitting, bed;Sit to stand from bed;Sit to stand from chair ADL Goal: Lower Body Dressing - Progress: Progressing toward goals Additional ADL Goal #1: Pt will perform all aspects of toileting with Supervision/Mod I level ambulating w/ RW to 3:1 over toilet ADL Goal: Additional Goal #1 - Progress: Progressing toward goals  OT Evaluation Precautions/Restrictions  Precautions Precautions: Knee Precaution Comments: Awaiting clarification Re: KI (pt reports that he was to have a KI however no orders in chart (P.T. left sticky note for Dr. Charlann Boxer) Required Braces or Orthoses: No (per Dr Charlann Boxer no KI needed) Restrictions Weight Bearing Restrictions: Yes RLE Weight Bearing: Weight bearing as tolerated Prior Functioning Home Living Bathroom Shower/Tub: Tub/shower unit Bathroom Toilet: Standard Bathroom Accessibility: Yes How Accessible: Accessible via walker Home Adaptive Equipment: Hand-held shower hose;Shower chair with back;Walker - rolling;Walker - standard;Wheelchair - Careers adviser (comment);Tub transfer bench (Long handled reacher; wife has sock aide) Additional Comments: Recommend 3:1 Prior Function Level of Independence: Needs assistance with ADLs Bath: Other (comment) (LE Min A; UE Mod I PTA) Dressing: Other (comment) (LE Min A; UE Mod I PTA) ADL ADL Eating/Feeding: Simulated;Modified independent Where Assessed - Eating/Feeding: Edge of bed Grooming: Simulated;Modified independent Where Assessed - Grooming:  Sitting, bed;Unsupported Upper  Body Bathing: Simulated;Modified independent Where Assessed - Upper Body Bathing: Sitting, bed;Unsupported Lower Body Bathing: Simulated;Maximal assistance Where Assessed - Lower Body Bathing: Sitting, bed;Unsupported Upper Body Dressing: Simulated;Modified independent Where Assessed - Upper Body Dressing: Sitting, bed;Unsupported Lower Body Dressing: Maximal assistance;Performed Lower Body Dressing Details (indicate cue type and reason): Don/Doff socks  Where Assessed - Lower Body Dressing: Supine, head of bed up Toilet Transfer: Simulated;Supervision/safety;Minimal assistance Toilet Transfer Details (indicate cue type and reason): Pt is Min guard assist sit-stand w/ VC's for safety/seq with RW Toilet Transfer Method: Ambulating;Stand pivot Toilet Transfer Equipment: Bedside commode Toileting - Clothing Manipulation: Simulated;Supervision/safety Toileting - Clothing Manipulation Details (indicate cue type and reason): VC's for safety with RW & hand placement Where Assessed - Toileting Clothing Manipulation: Sit to stand from 3-in-1 or toilet Toileting - Hygiene: Simulated;Modified independent Where Assessed - Toileting Hygiene: Sit to stand from 3-in-1 or toilet Equipment Used: Rolling walker;Other (comment) (Gait belt) Vision/Perception  Vision - History Baseline Vision: Wears glasses all the time Patient Visual Report: No change from baseline Cognition Cognition Arousal/Alertness: Other (comment) (Pt w/ increased pain secondary to delay Pain meds) Overall Cognitive Status: Appears within functional limits for tasks assessed Orientation Level: Oriented X4 Sensation/Coordination Sensation Light Touch: Appears Intact ( paresthesias right index finger) Coordination Gross Motor Movements are Fluid and Coordinated: Yes Fine Motor Movements are Fluid and Coordinated: Yes (h/o nerve compression RUE; arthritis as well) Extremity Assessment RUE  Assessment RUE Assessment: Within Functional Limits (pt w/ sig PMH; Bilat RTC repairs; 5/5 strength) LUE Assessment LUE Assessment: Within Functional Limits (pt w/ sig PMH; Bilat RTC repairs; 5/5 strength) Mobility  Bed Mobility Bed Mobility: Yes Supine to Sit: 4: Min assist Supine to Sit Details (indicate cue type and reason): Min assist R LE w/ VC's Transfers Transfers: Yes Sit to Stand: 4: Min assist;From bed;From elevated surface Sit to Stand Details (indicate cue type and reason): Min guard assist for sit-stand from EOB slightly elevated; VCs for technique and hand placement Stand to Sit: 4: Min assist Stand to Sit Details: Min guard assist w/ VC's safety Exercises Total Joint Exercises Ankle Circles/Pumps: AROM;Both;10 reps Quad Sets: AROM;Both;10 reps Heel Slides: 10 reps;Right;AAROM Hip ABduction/ADduction: AAROM;Right;10 reps Straight Leg Raises: AROM;Right;10 reps End of Session OT - End of Session Equipment Utilized During Treatment: Gait belt;Other (comment) (RW) Activity Tolerance: Patient tolerated treatment well;Other (comment) (pt with 10/10 pain, premedicated, however tol tx well) Patient left: in bed;with call bell in reach;with family/visitor present Nurse Communication: Other (comment) (IV complete & pain level) General Behavior During Session: [facility] for tasks performed Cognition: [facility] for tasks performed   Alm Bustard 01/29/2011, 1:43 PM

## 2011-01-29 NOTE — Progress Notes (Signed)
  Subjective: 1 Day Post-Op Procedure(s) (LRB): TOTAL KNEE ARTHROPLASTY (Right)  Patient reports pain as moderate.  Objective:   VITALS:   Filed Vitals:   01/29/11 0500  BP: 142/86  Pulse: 90  Temp: 98.8 F (37.1 C)  Resp: 16    Neurovascular intact Sensation intact distally Dorsiflexion/Plantar flexion intact Incision: dressing C/D/I No cellulitis present Compartment soft  LABS  Basename 01/29/11 0405  HGB 10.9*  HCT 32.3*  WBC 7.9  PLT 164     Basename 01/29/11 0405  NA 134*  K 4.2  BUN 20  CREATININE 1.04  GLUCOSE 149*    Assessment/Plan: 1 Day Post-Op Procedure(s) (LRB): TOTAL KNEE ARTHROPLASTY (Right)   Advance diet Up with therapy D/C IV fluids Plan for discharge tomorrow Discharge home with home health   Anastasio Auerbach. Shadawn Hanaway   PAC  01/29/2011, 7:37 AM

## 2011-01-29 NOTE — Progress Notes (Signed)
Physical Therapy Treatment Patient Details Name: [name] MRN: [id number] DOB: November 02, 1950 Today's Date: 01/29/2011 1015-1037;1te PT Assessment/Plan  PT - Assessment/Plan PT Plan: Discharge plan remains appropriate;Frequency remains appropriate PT Frequency: 7X/week Follow Up Recommendations: Home health PT Equipment Recommended: None recommended by PT PT Goals  Acute Rehab PT Goals PT Goal Formulation: With patient Time For Goal Achievement: 5 days Pt will go Supine/Side to Sit: with supervision PT Goal: Supine/Side to Sit - Progress: Progressing toward goal Pt will Transfer Sit to Stand/Stand to Sit: with supervision PT Transfer Goal: Sit to Stand/Stand to Sit - Progress: Progressing toward goal Pt will Ambulate: 51 - 150 feet;with rolling walker;with supervision PT Goal: Ambulate - Progress: Progressing toward goal Pt will Go Up / Down Stairs: 3-5 stairs;with least restrictive assistive device;with min assist PT Goal: Up/Down Stairs - Progress: Other (comment) Pt will Perform Home Exercise Program: with supervision, verbal cues required/provided PT Goal: Perform Home Exercise Program - Progress: Progressing toward goal  PT Treatment Precautions/Restrictions  Precautions Precautions: Knee Precaution Comments: left Dr. Charlann Boxer sticky note regarding need for KI?? Pt states he said he would have a KI, no orders. Required Braces or Orthoses: No (per Dr Charlann Boxer no KI needed) Restrictions Weight Bearing Restrictions: Yes RLE Weight Bearing: Weight bearing as tolerated Mobility (including Balance) Bed Mobility Bed Mobility: Yes Supine to Sit: 4: Min assist Supine to Sit Details (indicate cue type and reason): verbal cues for technique, min A with RLE Transfers Transfers: Yes Sit to Stand: 4: Min assist Sit to Stand Details (indicate cue type and reason): min/guard; verbal cues for hands and RLE position Stand to Sit: 4: Min assist Stand to Sit Details: same as  above Ambulation/Gait Ambulation/Gait: Yes Ambulation/Gait Assistance: 4: Min assist Ambulation/Gait Assistance Details (indicate cue type and reason): verbal cues for RW position Ambulation Distance (Feet): 5 Feet Assistive device: Rolling walker Gait Pattern: Step-to pattern;Antalgic    Exercise  Total Joint Exercises Ankle Circles/Pumps: AROM;Both;10 reps Quad Sets: AROM;Both;10 reps Heel Slides: 10 reps;Right;AAROM Hip ABduction/ADduction: AAROM;Right;10 reps Straight Leg Raises: AROM;Right;10 reps End of Session PT - End of Session Equipment Utilized During Treatment: Gait belt Activity Tolerance: Patient tolerated treatment well Patient left: in bed;with call bell in reach;with family/visitor present General Behavior During Session: [facility] for tasks performed Cognition: [facility] for tasks performed  [facility] 01/29/2011, 10:46 AM

## 2011-01-30 LAB — CBC
Hemoglobin: 11.4 g/dL — ABNORMAL LOW (ref 13.0–17.0)
MCH: 30.7 pg (ref 26.0–34.0)
MCV: 87.1 fL (ref 78.0–100.0)
RBC: 3.71 MIL/uL — ABNORMAL LOW (ref 4.22–5.81)

## 2011-01-30 LAB — GLUCOSE, CAPILLARY: Glucose-Capillary: 161 mg/dL — ABNORMAL HIGH (ref 70–99)

## 2011-01-30 MED ORDER — PSEUDOEPHEDRINE HCL ER 120 MG PO TB12: 120.0000 mg | ORAL_TABLET | Freq: Two times a day (BID) | ORAL | Status: DC

## 2011-01-30 MED ORDER — OXYCODONE HCL 5 MG PO TABS
5.0000 mg | ORAL_TABLET | ORAL | Status: DC | PRN
  Administered 2011-01-30 (×3): 15 mg via ORAL
  Filled 2011-01-30 (×3): qty 3

## 2011-01-30 MED ORDER — RIVAROXABAN 10 MG PO TABS: 10.0000 mg | ORAL_TABLET | ORAL | Status: DC

## 2011-01-30 MED ORDER — OXYCODONE HCL 5 MG PO TABS: 5.0000 mg | ORAL_TABLET | ORAL | Status: AC | PRN

## 2011-01-30 MED ORDER — METHOCARBAMOL 500 MG PO TABS: 500.0000 mg | ORAL_TABLET | Freq: Four times a day (QID) | ORAL | Status: AC | PRN

## 2011-01-30 MED ORDER — ACETAMINOPHEN 325 MG PO TABS: 325.0000 mg | ORAL_TABLET | Freq: Four times a day (QID) | ORAL | Status: AC | PRN

## 2011-01-30 MED ORDER — LORATADINE 10 MG PO TABS: 10.0000 mg | ORAL_TABLET | Freq: Every day | ORAL | Status: DC

## 2011-01-30 MED ORDER — FERROUS SULFATE 325 (65 FE) MG PO TABS: 325.0000 mg | ORAL_TABLET | Freq: Three times a day (TID) | ORAL | Status: DC

## 2011-01-30 NOTE — Progress Notes (Addendum)
Physical Therapy Treatment Patient Details Name: [name] MRN: [id number] DOB: [birth year] Today's Date: 01/30/2011 0865-7846; 2gt 1 te PT Assessment/Plan  PT - Assessment/Plan Comments on Treatment Session: BP after session 177/102; RN notified PT Goals  Acute Rehab PT Goals PT Goal: Supine/Side to Sit - Progress: Progressing toward goal PT Transfer Goal: Sit to Stand/Stand to Sit - Progress: Progressing toward goal PT Goal: Ambulate - Progress: Met PT Goal: Up/Down Stairs - Progress: Met PT Goal: Perform Home Exercise Program - Progress: Met  PT Treatment Precautions/Restrictions  Precautions Precautions: Knee Precaution Comments: Awaiting clarification Re: KI (pt reports that he was to have a KI however no orders in chart (P.T. left sticky note for Dr. Charlann Boxer) Required Braces or Orthoses: No (per Dr Charlann Boxer no KI needed) Restrictions Weight Bearing Restrictions: Yes RLE Weight Bearing: Weight bearing as tolerated Mobility (including Balance) Bed Mobility Bed Mobility: Yes Supine to Sit: 4: Min assist Supine to Sit Details (indicate cue type and reason):  verbal cues for scooting and assist wieth RLE Transfers Transfers: Yes Sit to Stand: 5: Supervision Sit to Stand Details (indicate cue type and reason): verbal cues for RLE position Stand to Sit: 5: Supervision Stand to Sit Details: same as above Ambulation/Gait Ambulation/Gait: Yes Ambulation/Gait Assistance: 5: Supervision Ambulation/Gait Assistance Details (indicate cue type and reason): verbal cues knee extension Ambulation Distance (Feet): 75 Feet Assistive device: Rolling walker Gait Pattern: Step-to pattern;Antalgic Stairs: Yes Stairs Assistance: 4: Min assist Stairs Assistance Details (indicate cue type and reason): verbal cues for sequence Stair Management Technique: With crutches Number of Stairs: 4     Exercise  Total Joint Exercises Ankle Circles/Pumps: AROM;Both;10 reps Quad Sets: AROM;Both;10  reps Heel Slides: 10 reps;Right;AAROM Straight Leg Raises: AROM;Right;10 reps End of Session PT - End of Session Equipment Utilized During Treatment: Gait belt Activity Tolerance: Patient tolerated treatment well Patient left: in bed;with call bell in reach;with family/visitor present General Cognition: [facility] for tasks performed  [facility] 01/30/2011, 10:11 AM

## 2011-01-30 NOTE — Progress Notes (Signed)
Cancel OT Session OT Treatment withheld today secondary to Pt BP = 205/115, pain level 9/10. RN notified and explained withhold treatment to pt whom is in agreement. Will check back as able

## 2011-01-30 NOTE — Discharge Summary (Signed)
Physician Discharge Summary  Patient ID: [name] MRN: [id number] DOB/AGE: 05/01/1950 60 y.o.  Admit date: 01/28/2011 Discharge date: 01/30/2011  Procedures:  Procedure(s) (LRB): TOTAL KNEE ARTHROPLASTY (Right)  Attending Physician: Shelda Pal   Admission Diagnoses: Right knee OA / Pain  Discharge Diagnoses:  1. Diabetes.  2. Hypertension.  3. Hyperlipidemia.  4. History of pancreatitis.  5. Sleep apnea though he does not use CPAP.  6. Also, history of DVT.  7. Rheumatoid arthritis.  8. Chronic back pain. 9. S/P right TKA  Procedures:  Procedure(s) (LRB): TOTAL KNEE ARTHROPLASTY (Right)  Discharged Condition: good  Hospital Course:  Patient underwent the above stated procedure on 01/28/2011. Patient tolerated the procedure well and brought to the recover room in good condition and subsequently to the floor.  POD #1 - 01/29/11 AF, VSS Neurovascular intact, Sensation intact distally, Dorsiflexion/Plantar flexion intact, Incision: dressing C/D/I, No cellulitis present and Compartment soft. HV and foley were d/c'ed. IV was changed to a saline lock.   LABS   Basename  01/29/11 0405   HGB  10.9*   HCT  32.3*     POD #2 - 01/30/2011 Neurovascular intact, Dorsiflexion/Plantar flexion intact, Incision: dressing C/D/I, No cellulitis present and Compartment soft.   LABS   Basename  01/30/11 0427    HGB  11.4*    HCT  32.3*      Disposition: Good to Home with HHPT  Discharge Orders    Future Appointments: Provider: Department: Dept Phone: Center:   04/18/2011 8:30 AM Elberta Fortis [facility] [phone] [facility]     Future Orders Please Complete By Expires   Ambulatory referral to Home Health      Comments:   Please evaluate [name] for admission to [facility].  Disciplines requested: Physical Therapy  Physician to follow patient's care (the person listed here will be responsible for signing ongoing orders): Other: Dr.  Charlann Boxer  Requested Start of Care Date: Tomorrow  Special Instructions:  ROM, strengthening and ambulation. S/P right TKA.   Call MD / Call 911      Comments:   If you experience chest pain or shortness of breath, CALL 911 and be transported to the hospital emergency room.  If you develope a fever above 101 F, pus (white drainage) or increased drainage or redness at the wound, or calf pain, call your surgeon's office.   Constipation Prevention      Comments:   Drink plenty of fluids.  Prune juice may be helpful.  You may use a stool softener, such as Colace (over the counter) 100 mg twice a day.  Use MiraLax (over the counter) for constipation as needed.   Increase activity slowly as tolerated      Weight Bearing as taught in Physical Therapy      Comments:   Use a walker or crutches as instructed.   TED hose      Comments:   Use stockings (TED hose) for 2 weeks on both leg(s).  You may remove them at night for sleeping.   Change dressing      Comments:   Maintain surgical dressing for 8 days, then replace with gauze and tape. Keep the area dry and clean until follow up in 2 weeks.    Discharge instructions      Comments:   Maintain surgical dressing for 8 days, then replace with gauze and tape. Keep the area dry and clean until follow up in 2 weeks.  Current Discharge Medication List    START taking these medications   Details  acetaminophen (TYLENOL) 325 MG tablet Take 1-2 tablets (325-650 mg total) by mouth every 6 (six) hours as needed (or Fever >/= 101). Qty: 30 tablet    ferrous sulfate 325 (65 FE) MG tablet Take 1 tablet (325 mg total) by mouth 3 (three) times daily after meals. Refills: 0    loratadine (CLARITIN) 10 MG tablet Take 1 tablet (10 mg total) by mouth daily.    methocarbamol (ROBAXIN) 500 MG tablet Take 1 tablet (500 mg total) by mouth every 6 (six) hours as needed (muscle spasms). Qty: 50 tablet, Refills: 0    oxyCODONE (OXY IR/ROXICODONE) 5 MG  immediate release tablet Take 1-3 tablets (5-15 mg total) by mouth every 4 (four) hours as needed. Qty: 100 tablet, Refills: 0    pseudoephedrine (SUDAFED) 120 MG 12 hr tablet Take 1 tablet (120 mg total) by mouth 2 (two) times daily.    rivaroxaban (XARELTO) 10 MG TABS tablet Take 1 tablet (10 mg total) by mouth daily. Qty: 12 tablet, Refills: 0      CONTINUE these medications which have NOT CHANGED   Details  Fexofenadine-Pseudoephedrine (ALLEGRA-D 12 HOUR PO) Take 1 tablet by mouth daily.     albuterol (VENTOLIN HFA) 108 (90 BASE) MCG/ACT inhaler Inhale 2 puffs into the lungs every 4 (four) hours as needed. Shortness of breath.    azelastine (ASTELIN) 137 MCG/SPRAY nasal spray Place 2 sprays into the nose 2 (two) times daily. Use in each nostril as directed    docusate sodium (COLACE) 100 MG capsule Take 100 mg by mouth 3 (three) times daily as needed.     glucose blood test strip 1 each by Other route. Use as instructed 5-6 times daily     !! insulin aspart (NOVOLOG) 100 UNIT/ML injection 3 units 3 times a day, also uses on sliding scale 6 vials for 90 days on average Qty: 10 mL, Refills: 3    !! insulin aspart (NOVOLOG) 100 UNIT/ML injection Inject 3-12 Units into the skin 3 (three) times daily before meals. 3 units 3 times a day, 151-200 4 units, 201-250 6 units, 251-300 8 units, 301-350 10 units, 351-400 12 units.  9 vials for 90 day supply on average    insulin glargine (LANTUS) 100 UNIT/ML injection Inject 50 Units into the skin 2 (two) times daily.     Insulin Syringe-Needle U-100 (BD INSULIN SYRINGE ULTRAFINE) 31G X 5/16" 0.5 ML MISC Use daily as directed Qty: 100 each, Refills: 3    ketoconazole (NIZORAL) 2 % cream Apply topically daily. As needed Qty: 15 g, Refills: 5   Associated Diagnoses: Rash    lidocaine (LIDODERM) 5 % Place 1 patch onto the skin daily. Remove & Discard patch within 12 hours or as directed by MD.  For pain.    lisinopril (PRINIVIL,ZESTRIL) 40  MG tablet Take 40 mg by mouth every morning.     metoprolol (LOPRESSOR) 100 MG tablet Take 100 mg by mouth every morning.     morphine (MS CONTIN) 60 MG 12 hr tablet Take 60 mg by mouth 3 (three) times daily.     polyethylene glycol (MIRALAX / GLYCOLAX) packet Take 17 g by mouth daily.      simvastatin (ZOCOR) 40 MG tablet Take 40 mg by mouth at bedtime.     tadalafil (CIALIS) 20 MG tablet Take 20 mg by mouth. One tab by mouth every other day as needed     !! -  Potential duplicate medications found. Please discuss with provider.    STOP taking these medications     cyclobenzaprine (FLEXERIL) 10 MG tablet      HYDROcodone-acetaminophen (NORCO) 10-325 MG per tablet          Signed: Anastasio Auerbach. Kalon Erhardt   PAC  01/30/2011, 11:29 AM

## 2011-01-30 NOTE — Progress Notes (Signed)
Subjective: 2 Days Post-Op Procedure(s) (LRB): TOTAL KNEE ARTHROPLASTY (Right)  Patient reports pain as mild.  Objective:   VITALS:   Filed Vitals:   01/30/11 1052  BP: 205/115  Pulse: 85  Temp:   Resp:     Neurovascular intact Dorsiflexion/Plantar flexion intact Incision: dressing C/D/I No cellulitis present Compartment soft  LABS  Basename 01/30/11 0427 01/29/11 0405  HGB 11.4* 10.9*  HCT 32.3* 32.3*  WBC 7.7 7.9  PLT 179 164     Basename 01/29/11 0405  NA 134*  K 4.2  BUN 20  CREATININE 1.04  GLUCOSE 149*    No results found for this basename: LABPT:2,INR:2 in the last 72 hours   Assessment/Plan: 2 Days Post-Op Procedure(s) (LRB): TOTAL KNEE ARTHROPLASTY (Right)   Advance diet Up with therapy Discharge home with home health   Anastasio Auerbach. Cletus Paris   PAC  01/30/2011, 11:34 AM

## 2011-01-31 LAB — GLUCOSE, CAPILLARY: Glucose-Capillary: 168 mg/dL — ABNORMAL HIGH (ref 70–99)

## 2011-02-02 ENCOUNTER — Encounter (HOSPITAL_COMMUNITY): Payer: Self-pay | Admitting: Orthopedic Surgery

## 2011-02-16 NOTE — H&P (Signed)
HISTORY & PHYSICAL      ADMITTING DIAGNOSIS:  Osteoarthritis, left knee.      HISTORY OF PRESENT ILLNESS:  This is a 60 year old gentleman with a  history of osteoarthritis of his right knee and left knees. He had a right TKA in October of 2012, he has done well with the surgery and wishes to proceed with the left knee. Pt has had failure of conservative treatment to alleviate his symptoms.  X-rays show end-stage OA changes of the left knee. After discussion  of treatment, benefits, risks, and options, the patient is now scheduled  for total knee arthroplasty of his left knee. The surgery,  risks, benefits, and aftercare were discussed in detail and understood. The patient's  questions were invited and answered.    PCP: Dr. Marjory Lies  CHRONIC PAIN MANAGEMENT: Dr. Thyra Breed     PAST MEDICAL HISTORY:  Serious medical illnesses include:   1. Diabetes.   2. Hypertension.   3. Hyperlipidemia.   4. History of pancreatitis.   5. Sleep apnea though he does not use CPAP.   6. Also, history of DVT.   7. Rheumatoid arthritis.   8. Chronic back pain.      PAST SURGICAL HISTORY:  Previous surgeries include  1. 3 x lower back surgeries 2. Left wrist surgery 3. Bilateral knee arthroscopies 4. Bilateral shoulders 5. Cholecystectomy 6. Left thumb 7. Right elbow.      CURRENT MEDICATIONS:   1. Lisinopril 40 mg 1 daily.   2. Metoprolol 100 mg 1 daily.   3. Simvastatin 40 mg daily.   4. Morphine 60 mg t.i.d.   5. Dilaudid 4 mg 1 p.o. q 4 hours  6. NovoLog sliding scale.   7. Insulin Lantus 50 units b.i.d.   8. Lidoderm patches p.r.n.   9. Allegra-D 1 daily stool softeners.   ALLERGIES: 1. CORTISONE with elevated blood sugar and pancreatitis   FAMILY HISTORY:  Positive for heart disease and cancer.      SOCIAL HISTORY:  The patient is married.  He is a Air traffic controller.  He  does not drink and does not smoke.  He is planning on going home after surgery.     REVIEW OF SYSTEMS:   CENTRAL NERVOUS SYSTEM:  Positive for vertigo.   PULMONARY:  Positive for history of asthma and pneumonia and history of sleep apnea for which he does not use CPAP.   CARDIOVASCULAR:  Negative for chest pain or palpitation, positive for  history of DVT.   GI:  Positive for pancreatitis and gallstones.   MUSCULOSKELETAL:  Positive as in HPI.      PHYSICAL EXAMINATION:  VITAL SIGNS:  Blood pressure 123/76, respirations 16, pulse 67 and regular.   GENERAL APPEARANCE:  This is a well-developed, well-nourished gentleman,  in no acute distress.   HEENT:  Head normocephalic.  Nose patent.  Ears patent.  Pupils equal,  round, and reactive to light.  Throat without injection.   NECK:  Supple without adenopathy.  Carotids 2+ without bruit.   CHEST:  Clear to auscultation.  No rales or rhonchi.  Respirations 16.   HEART:  Regular rate and rhythm at 67 beats per minute without murmur.   ABDOMEN:  Soft with active bowel sounds.  No masses, organomegaly.   NEUROLOGIC:  The patient is alert and orient to time, place and person.  Cranial nerves 2-12 grossly intact.   EXTREMITIES:  Shows the knees with varus deformity.  Right knee has  painful range of motion.  Neurovascular status intact.  He also has had  multiple back surgeries with chronic back pain.      IMPRESSION:  Osteoarthritis, left knee.      PLAN:  Total knee arthroplasty, left knee. Risks, benefits and expectations were discussed with the patient. Patient understand the risks, benefits and expectations and wishes to proceed with surgery.   Anastasio Auerbach Rusti Arizmendi   PAC  02/16/2011, 3:56 PM

## 2011-02-25 ENCOUNTER — Encounter (HOSPITAL_COMMUNITY): Payer: Self-pay | Admitting: Pharmacy Technician

## 2011-02-26 ENCOUNTER — Encounter (HOSPITAL_COMMUNITY)
Admission: RE | Admit: 2011-02-26 | Discharge: 2011-02-26 | Disposition: A | Discharge status: Home / Self Care | Payer: BC Managed Care – PPO | Source: Ambulatory Visit | Attending: Orthopedic Surgery | Admitting: Orthopedic Surgery

## 2011-02-26 ENCOUNTER — Encounter (HOSPITAL_COMMUNITY): Payer: Self-pay

## 2011-02-26 LAB — URINALYSIS, ROUTINE W REFLEX MICROSCOPIC
Leukocytes, UA: NEGATIVE
Protein, ur: NEGATIVE mg/dL
Specific Gravity, Urine: 1.034 — ABNORMAL HIGH (ref 1.005–1.030)
Urobilinogen, UA: 0.2 mg/dL (ref 0.0–1.0)

## 2011-02-26 LAB — CBC
HCT: 41.3 % (ref 39.0–52.0)
Hemoglobin: 13.9 g/dL (ref 13.0–17.0)
MCHC: 33.7 g/dL (ref 30.0–36.0)
MCV: 89.4 fL (ref 78.0–100.0)

## 2011-02-26 LAB — BASIC METABOLIC PANEL
BUN: 28 mg/dL — ABNORMAL HIGH (ref 6–23)
CO2: 29 mEq/L (ref 19–32)
Chloride: 103 mEq/L (ref 96–112)
Creatinine, Ser: 1.2 mg/dL (ref 0.50–1.35)
GFR calc Af Amer: 75 mL/min — ABNORMAL LOW (ref >90)
Potassium: 4.9 mEq/L (ref 3.5–5.1)

## 2011-02-26 LAB — DIFFERENTIAL
Basophils Relative: 0 % (ref 0–1)
Eosinophils Relative: 1 % (ref 0–5)
Lymphocytes Relative: 43 % (ref 12–46)
Monocytes Absolute: 0.8 10*3/uL (ref 0.1–1.0)
Monocytes Relative: 7 % (ref 3–12)
Neutro Abs: 5.4 10*3/uL (ref 1.7–7.7)

## 2011-02-26 LAB — SURGICAL PCR SCREEN: Staphylococcus aureus: NEGATIVE

## 2011-02-26 NOTE — Pre-Procedure Instructions (Signed)
EKG REPORT 11/28/10 ON CHART FROM DR. Caryl Never  AND CXR REPORT 01/22/11 ON CHART FROM [facility]

## 2011-02-26 NOTE — Patient Instructions (Signed)
20 [name]  02/26/2011   Your procedure is scheduled on:  Tuesday 03/04/11  AT 7:15 AM  Report to [facility] at 5:15 AM.  Call this number if you have problems the morning of surgery: [phone]   Remember:ONLY TAKE 1/2 YOUR USUAL INSULIN THE EVENING BEFORE YOUR SURGERY--DO NOT TAKE ANY INSULIN OR ANY DIABETIC MEDS THE AM OF YOUR SURGERY   Do not eat food OR DRINK ANYTHING AFTER MIDNIGHT - THE NIGHT BEFORE YOUR SURGERY..    Take these medicines the morning of surgery with A SIP OF WATER: MORPHINE, MAY WEAR LIDOCAINE PATCH IF NEEDED, USE YOUR ALBUTEROL INHALER AND BRING TO OR   Do not wear jewelry, make-up or nail polish.  Do not wear lotions, powders, or perfumes. You may wear deodorant.  Do not shave 48 hours prior to surgery.  Do not bring valuables to the hospital.  Contacts, dentures or bridgework may not be worn into surgery.  Leave suitcase in the car. After surgery it may be brought to your room.  For patients admitted to the hospital, checkout time is 11:00 AM the day of discharge.   Patients discharged the day of surgery will not be allowed to drive home.  Name and phone number of your driver:   Special Instructions: CHG Shower Use Special Wash: 1/2 bottle night before surgery and 1/2 bottle morning of surgery.   Please read over the following fact sheets that you were given: Blood Transfusion Information and MRSA Information AND INCENTIVE SPIROMETER INSTRUCTIONS

## 2011-03-04 ENCOUNTER — Encounter (HOSPITAL_COMMUNITY)
Admission: RE | Disposition: A | Discharge status: Home Health | Payer: Self-pay | Source: Ambulatory Visit | Attending: Orthopedic Surgery

## 2011-03-04 ENCOUNTER — Inpatient Hospital Stay (HOSPITAL_COMMUNITY): Payer: BC Managed Care – PPO | Admitting: Anesthesiology

## 2011-03-04 ENCOUNTER — Inpatient Hospital Stay (HOSPITAL_COMMUNITY)
Admission: RE | Admit: 2011-03-04 | Discharge: 2011-03-06 | DRG: 209 | Disposition: A | Discharge status: Home Health | Payer: BC Managed Care – PPO | Source: Ambulatory Visit | Attending: Orthopedic Surgery | Admitting: Orthopedic Surgery

## 2011-03-04 ENCOUNTER — Encounter (HOSPITAL_COMMUNITY): Payer: Self-pay | Admitting: *Deleted

## 2011-03-04 ENCOUNTER — Encounter (HOSPITAL_COMMUNITY): Payer: Self-pay | Admitting: Anesthesiology

## 2011-03-04 DIAGNOSIS — E119 Type 2 diabetes mellitus without complications: Secondary | ICD-10-CM | POA: Diagnosis present

## 2011-03-04 DIAGNOSIS — I10 Essential (primary) hypertension: Secondary | ICD-10-CM | POA: Diagnosis present

## 2011-03-04 DIAGNOSIS — Z86718 Personal history of other venous thrombosis and embolism: Secondary | ICD-10-CM

## 2011-03-04 DIAGNOSIS — M069 Rheumatoid arthritis, unspecified: Secondary | ICD-10-CM | POA: Diagnosis present

## 2011-03-04 DIAGNOSIS — M1710 Unilateral primary osteoarthritis, unspecified knee: Principal | ICD-10-CM | POA: Diagnosis present

## 2011-03-04 DIAGNOSIS — E785 Hyperlipidemia, unspecified: Secondary | ICD-10-CM | POA: Diagnosis present

## 2011-03-04 DIAGNOSIS — G4730 Sleep apnea, unspecified: Secondary | ICD-10-CM | POA: Diagnosis present

## 2011-03-04 DIAGNOSIS — Z794 Long term (current) use of insulin: Secondary | ICD-10-CM

## 2011-03-04 DIAGNOSIS — Z96659 Presence of unspecified artificial knee joint: Secondary | ICD-10-CM

## 2011-03-04 HISTORY — PX: TOTAL KNEE ARTHROPLASTY: SHX125

## 2011-03-04 LAB — GLUCOSE, CAPILLARY
Glucose-Capillary: 127 mg/dL — ABNORMAL HIGH (ref 70–99)
Glucose-Capillary: 100 mg/dL — ABNORMAL HIGH (ref 70–99)
Glucose-Capillary: 88 mg/dL (ref 70–99)
Glucose-Capillary: 98 mg/dL (ref 70–99)

## 2011-03-04 LAB — TYPE AND SCREEN
ABO/RH(D): O POS
Antibody Screen: NEGATIVE

## 2011-03-04 SURGERY — ARTHROPLASTY, KNEE, TOTAL
Anesthesia: General | Site: Knee | Laterality: Left | Wound class: Clean

## 2011-03-04 MED ORDER — PHENOL 1.4 % MT LIQD: 1.0000 | OROMUCOSAL | Status: DC | PRN

## 2011-03-04 MED ORDER — CEFAZOLIN SODIUM-DEXTROSE 2-3 GM-% IV SOLR
2.0000 g | Freq: Once | INTRAVENOUS | Status: AC
  Administered 2011-03-04: 2 g via INTRAVENOUS

## 2011-03-04 MED ORDER — HYDROMORPHONE HCL 2 MG PO TABS
2.0000 mg | ORAL_TABLET | ORAL | Status: DC | PRN
  Administered 2011-03-04 – 2011-03-05 (×3): 4 mg via ORAL
  Administered 2011-03-05: 2 mg via ORAL
  Administered 2011-03-05 – 2011-03-06 (×7): 4 mg via ORAL
  Filled 2011-03-04 (×11): qty 2

## 2011-03-04 MED ORDER — BUPIVACAINE-EPINEPHRINE PF 0.25-1:200000 % IJ SOLN
INTRAMUSCULAR | Status: DC | PRN
  Administered 2011-03-04: 60 mL

## 2011-03-04 MED ORDER — SIMVASTATIN 40 MG PO TABS
40.0000 mg | ORAL_TABLET | Freq: Every day | ORAL | Status: DC
  Administered 2011-03-04 – 2011-03-05 (×2): 40 mg via ORAL
  Filled 2011-03-04 (×3): qty 1

## 2011-03-04 MED ORDER — HYDROMORPHONE HCL PF 1 MG/ML IJ SOLN
0.2000 mg | INTRAMUSCULAR | Status: DC | PRN
Start: 2011-03-04 — End: 2011-03-06
  Administered 2011-03-04 – 2011-03-05 (×2): 0.6 mg via INTRAVENOUS
  Administered 2011-03-05: 0.5 mg via INTRAVENOUS
  Filled 2011-03-04 (×3): qty 1

## 2011-03-04 MED ORDER — FERROUS SULFATE 325 (65 FE) MG PO TABS
325.0000 mg | ORAL_TABLET | Freq: Three times a day (TID) | ORAL | Status: DC
  Administered 2011-03-04 – 2011-03-06 (×5): 325 mg via ORAL
  Filled 2011-03-04 (×8): qty 1

## 2011-03-04 MED ORDER — DOCUSATE SODIUM 100 MG PO CAPS
100.0000 mg | ORAL_CAPSULE | Freq: Two times a day (BID) | ORAL | Status: DC
  Administered 2011-03-04 – 2011-03-05 (×4): 100 mg via ORAL
  Filled 2011-03-04 (×6): qty 1

## 2011-03-04 MED ORDER — METOPROLOL TARTRATE 100 MG PO TABS
100.0000 mg | ORAL_TABLET | Freq: Every day | ORAL | Status: DC
  Administered 2011-03-05: 100 mg via ORAL
  Filled 2011-03-04 (×2): qty 1

## 2011-03-04 MED ORDER — INSULIN GLARGINE 100 UNIT/ML ~~LOC~~ SOLN
50.0000 [IU] | Freq: Two times a day (BID) | SUBCUTANEOUS | Status: DC
  Administered 2011-03-05 (×2): 50 [IU] via SUBCUTANEOUS
  Filled 2011-03-04: qty 3

## 2011-03-04 MED ORDER — ACETAMINOPHEN 10 MG/ML IV SOLN
INTRAVENOUS | Status: DC | PRN
  Administered 2011-03-04: 1000 mg via INTRAVENOUS

## 2011-03-04 MED ORDER — ACETAMINOPHEN 650 MG RE SUPP: 650.0000 mg | Freq: Four times a day (QID) | RECTAL | Status: DC | PRN

## 2011-03-04 MED ORDER — METHOCARBAMOL 100 MG/ML IJ SOLN
500.0000 mg | Freq: Four times a day (QID) | INTRAVENOUS | Status: DC | PRN
  Administered 2011-03-04: 500 mg via INTRAVENOUS
  Filled 2011-03-04: qty 5

## 2011-03-04 MED ORDER — INSULIN ASPART 100 UNIT/ML ~~LOC~~ SOLN
3.0000 [IU] | Freq: Three times a day (TID) | SUBCUTANEOUS | Status: DC
  Administered 2011-03-05: 8 [IU] via SUBCUTANEOUS
  Administered 2011-03-05: 6 [IU] via SUBCUTANEOUS
  Administered 2011-03-05: 4 [IU] via SUBCUTANEOUS
  Filled 2011-03-04: qty 3

## 2011-03-04 MED ORDER — MENTHOL 3 MG MT LOZG: 1.0000 | LOZENGE | OROMUCOSAL | Status: DC | PRN

## 2011-03-04 MED ORDER — LACTATED RINGERS IV SOLN
INTRAVENOUS | Status: DC | PRN
  Administered 2011-03-04 (×3): via INTRAVENOUS

## 2011-03-04 MED ORDER — HYDROMORPHONE HCL PF 2 MG/ML IJ SOLN
0.2500 mg | INTRAMUSCULAR | Status: DC | PRN
  Administered 2011-03-04 (×3): 0.5 mg via INTRAVENOUS

## 2011-03-04 MED ORDER — SODIUM CHLORIDE 0.9 % IV SOLN
INTRAVENOUS | Status: DC
  Administered 2011-03-04 – 2011-03-05 (×2): via INTRAVENOUS
  Filled 2011-03-04 (×13): qty 1000

## 2011-03-04 MED ORDER — ONDANSETRON HCL 4 MG/2ML IJ SOLN
INTRAMUSCULAR | Status: DC | PRN
  Administered 2011-03-04: 4 mg via INTRAVENOUS

## 2011-03-04 MED ORDER — AZELASTINE HCL 0.1 % NA SOLN
2.0000 | Freq: Two times a day (BID) | NASAL | Status: DC | PRN
  Filled 2011-03-04: qty 30

## 2011-03-04 MED ORDER — ALBUTEROL SULFATE HFA 108 (90 BASE) MCG/ACT IN AERS
2.0000 | INHALATION_SPRAY | RESPIRATORY_TRACT | Status: DC | PRN
  Filled 2011-03-04: qty 6.7

## 2011-03-04 MED ORDER — MIDAZOLAM HCL 5 MG/5ML IJ SOLN
INTRAMUSCULAR | Status: DC | PRN
  Administered 2011-03-04: 2 mg via INTRAVENOUS

## 2011-03-04 MED ORDER — SODIUM CHLORIDE 0.9 % IR SOLN
Status: DC | PRN
  Administered 2011-03-04: 1000 mL

## 2011-03-04 MED ORDER — FENTANYL CITRATE 0.05 MG/ML IJ SOLN
INTRAMUSCULAR | Status: DC | PRN
  Administered 2011-03-04: 50 ug via INTRAVENOUS
  Administered 2011-03-04 (×3): 100 ug via INTRAVENOUS
  Administered 2011-03-04: 50 ug via INTRAVENOUS

## 2011-03-04 MED ORDER — MORPHINE SULFATE CR 30 MG PO TB12
60.0000 mg | ORAL_TABLET | Freq: Three times a day (TID) | ORAL | Status: DC
  Administered 2011-03-04 – 2011-03-05 (×5): 60 mg via ORAL
  Filled 2011-03-04 (×5): qty 2

## 2011-03-04 MED ORDER — MEPERIDINE HCL 50 MG/ML IJ SOLN: 6.2500 mg | INTRAMUSCULAR | Status: DC | PRN

## 2011-03-04 MED ORDER — PROMETHAZINE HCL 25 MG/ML IJ SOLN: 6.2500 mg | INTRAMUSCULAR | Status: DC | PRN

## 2011-03-04 MED ORDER — METHOCARBAMOL 500 MG PO TABS
500.0000 mg | ORAL_TABLET | Freq: Four times a day (QID) | ORAL | Status: DC | PRN
  Administered 2011-03-05 – 2011-03-06 (×3): 500 mg via ORAL
  Filled 2011-03-04 (×3): qty 1

## 2011-03-04 MED ORDER — HYDROMORPHONE HCL PF 1 MG/ML IJ SOLN
INTRAMUSCULAR | Status: DC | PRN
  Administered 2011-03-04 (×2): 1 mg via INTRAVENOUS

## 2011-03-04 MED ORDER — ROPIVACAINE HCL 5 MG/ML IJ SOLN
INTRAMUSCULAR | Status: DC | PRN
  Administered 2011-03-04: 30 mL via EPIDURAL

## 2011-03-04 MED ORDER — ONDANSETRON HCL 4 MG PO TABS: 4.0000 mg | ORAL_TABLET | Freq: Four times a day (QID) | ORAL | Status: DC | PRN

## 2011-03-04 MED ORDER — LACTATED RINGERS IV SOLN: INTRAVENOUS | Status: DC

## 2011-03-04 MED ORDER — ONDANSETRON HCL 4 MG/2ML IJ SOLN: 4.0000 mg | Freq: Four times a day (QID) | INTRAMUSCULAR | Status: DC | PRN

## 2011-03-04 MED ORDER — LISINOPRIL 40 MG PO TABS
40.0000 mg | ORAL_TABLET | Freq: Every day | ORAL | Status: DC
  Administered 2011-03-05: 40 mg via ORAL
  Filled 2011-03-04 (×2): qty 1

## 2011-03-04 MED ORDER — ACETAMINOPHEN 325 MG PO TABS: 650.0000 mg | ORAL_TABLET | Freq: Four times a day (QID) | ORAL | Status: DC | PRN

## 2011-03-04 MED ORDER — ZOLPIDEM TARTRATE 5 MG PO TABS: 5.0000 mg | ORAL_TABLET | Freq: Every evening | ORAL | Status: DC | PRN

## 2011-03-04 MED ORDER — KETOROLAC TROMETHAMINE 30 MG/ML IJ SOLN
INTRAMUSCULAR | Status: DC | PRN
  Administered 2011-03-04: 30 mg

## 2011-03-04 MED ORDER — SUCCINYLCHOLINE CHLORIDE 20 MG/ML IJ SOLN
INTRAMUSCULAR | Status: DC | PRN
  Administered 2011-03-04: 100 mg via INTRAVENOUS

## 2011-03-04 MED ORDER — METOPROLOL TARTRATE 100 MG PO TABS
100.0000 mg | ORAL_TABLET | Freq: Once | ORAL | Status: AC
  Administered 2011-03-04: 100 mg via ORAL
  Filled 2011-03-04: qty 1

## 2011-03-04 MED ORDER — HYDROMORPHONE HCL PF 1 MG/ML IJ SOLN
INTRAMUSCULAR | Status: AC
  Administered 2011-03-04: 0.5 mg
  Filled 2011-03-04: qty 1

## 2011-03-04 MED ORDER — CEFAZOLIN SODIUM 1-5 GM-% IV SOLN
1.0000 g | Freq: Four times a day (QID) | INTRAVENOUS | Status: AC
  Administered 2011-03-04 – 2011-03-05 (×3): 1 g via INTRAVENOUS
  Filled 2011-03-04 (×4): qty 50

## 2011-03-04 MED ORDER — PROPOFOL 10 MG/ML IV BOLUS
INTRAVENOUS | Status: DC | PRN
  Administered 2011-03-04: 200 mg via INTRAVENOUS

## 2011-03-04 MED ORDER — POLYETHYLENE GLYCOL 3350 17 G PO PACK
17.0000 g | PACK | Freq: Every day | ORAL | Status: DC | PRN
  Filled 2011-03-04: qty 1

## 2011-03-04 MED ORDER — RIVAROXABAN 10 MG PO TABS
10.0000 mg | ORAL_TABLET | Freq: Every day | ORAL | Status: DC
  Administered 2011-03-05 – 2011-03-06 (×2): 10 mg via ORAL
  Filled 2011-03-04 (×2): qty 1

## 2011-03-04 MED ORDER — ALUM & MAG HYDROXIDE-SIMETH 200-200-20 MG/5ML PO SUSP: 30.0000 mL | ORAL | Status: DC | PRN

## 2011-03-04 MED ORDER — LIDOCAINE HCL (CARDIAC) 20 MG/ML IV SOLN
INTRAVENOUS | Status: DC | PRN
  Administered 2011-03-04: 50 mg via INTRAVENOUS

## 2011-03-04 SURGICAL SUPPLY — 58 items
ADH SKN CLS APL DERMABOND .7 (GAUZE/BANDAGES/DRESSINGS) ×1
BAG SPEC THK2 15X12 ZIP CLS (MISCELLANEOUS) ×1
BAG ZIPLOCK 12X15 (MISCELLANEOUS) ×2 IMPLANT
BANDAGE ELASTIC 6 VELCRO ST LF (GAUZE/BANDAGES/DRESSINGS) ×2 IMPLANT
BANDAGE ESMARK 6X9 LF (GAUZE/BANDAGES/DRESSINGS) ×1 IMPLANT
BLADE SAW SGTL 13.0X1.19X90.0M (BLADE) ×2 IMPLANT
BNDG CMPR 9X6 STRL LF SNTH (GAUZE/BANDAGES/DRESSINGS) ×1
BNDG ESMARK 6X9 LF (GAUZE/BANDAGES/DRESSINGS) ×2
BONE CEMENT GENTAMICIN (Cement) ×2 IMPLANT
BOWL SMART MIX CTS (DISPOSABLE) ×2 IMPLANT
CEMENT BONE GENTAMICIN 40 (Cement) IMPLANT
CLOTH BEACON ORANGE TIMEOUT ST (SAFETY) ×2 IMPLANT
CUFF TOURN SGL QUICK 34 (TOURNIQUET CUFF) ×2
CUFF TRNQT CYL 34X4X40X1 (TOURNIQUET CUFF) ×1 IMPLANT
DECANTER SPIKE VIAL GLASS SM (MISCELLANEOUS) ×2 IMPLANT
DERMABOND ADVANCED (GAUZE/BANDAGES/DRESSINGS) ×1
DERMABOND ADVANCED .7 DNX12 (GAUZE/BANDAGES/DRESSINGS) ×1 IMPLANT
DRAPE EXTREMITY T 121X128X90 (DRAPE) ×2 IMPLANT
DRAPE POUCH INSTRU U-SHP 10X18 (DRAPES) ×2 IMPLANT
DRAPE U-SHAPE 47X51 STRL (DRAPES) ×2 IMPLANT
DRSG AQUACEL AG ADV 3.5X10 (GAUZE/BANDAGES/DRESSINGS) ×2 IMPLANT
DRSG TEGADERM 4X4.75 (GAUZE/BANDAGES/DRESSINGS) ×2 IMPLANT
DURAPREP 26ML APPLICATOR (WOUND CARE) ×2 IMPLANT
ELECT REM PT RETURN 9FT ADLT (ELECTROSURGICAL) ×2
ELECTRODE REM PT RTRN 9FT ADLT (ELECTROSURGICAL) ×1 IMPLANT
EVACUATOR 1/8 PVC DRAIN (DRAIN) ×2 IMPLANT
FACESHIELD LNG OPTICON STERILE (SAFETY) ×10 IMPLANT
GAUZE SPONGE 2X2 8PLY STRL LF (GAUZE/BANDAGES/DRESSINGS) ×1 IMPLANT
GLOVE BIOGEL PI IND STRL 7.5 (GLOVE) ×1 IMPLANT
GLOVE BIOGEL PI IND STRL 8 (GLOVE) ×1 IMPLANT
GLOVE BIOGEL PI INDICATOR 7.5 (GLOVE) ×1
GLOVE BIOGEL PI INDICATOR 8 (GLOVE) ×1
GLOVE ECLIPSE 8.0 STRL XLNG CF (GLOVE) ×2 IMPLANT
GLOVE ORTHO TXT STRL SZ7.5 (GLOVE) ×4 IMPLANT
GOWN STRL NON-REIN LRG LVL3 (GOWN DISPOSABLE) ×2 IMPLANT
HANDPIECE INTERPULSE COAX TIP (DISPOSABLE) ×2
IMMOBILIZER KNEE 20 (SOFTGOODS)
IMMOBILIZER KNEE 20 THIGH 36 (SOFTGOODS) IMPLANT
KIT BASIN OR (CUSTOM PROCEDURE TRAY) ×2 IMPLANT
MANIFOLD NEPTUNE II (INSTRUMENTS) ×2 IMPLANT
NDL SAFETY ECLIPSE 18X1.5 (NEEDLE) ×1 IMPLANT
NEEDLE HYPO 18GX1.5 SHARP (NEEDLE) ×2
NS IRRIG 1000ML POUR BTL (IV SOLUTION) ×4 IMPLANT
PACK TOTAL JOINT (CUSTOM PROCEDURE TRAY) ×2 IMPLANT
POSITIONER SURGICAL ARM (MISCELLANEOUS) ×2 IMPLANT
SET HNDPC FAN SPRY TIP SCT (DISPOSABLE) ×1 IMPLANT
SET PAD KNEE POSITIONER (MISCELLANEOUS) ×2 IMPLANT
SPONGE GAUZE 2X2 STER 10/PKG (GAUZE/BANDAGES/DRESSINGS) ×1
SUCTION FRAZIER 12FR DISP (SUCTIONS) ×2 IMPLANT
SUT MNCRL AB 4-0 PS2 18 (SUTURE) ×2 IMPLANT
SUT VIC AB 1 CT1 36 (SUTURE) ×6 IMPLANT
SUT VIC AB 2-0 CT1 27 (SUTURE) ×6
SUT VIC AB 2-0 CT1 TAPERPNT 27 (SUTURE) ×3 IMPLANT
SYR 50ML LL SCALE MARK (SYRINGE) ×2 IMPLANT
TOWEL OR 17X26 10 PK STRL BLUE (TOWEL DISPOSABLE) ×4 IMPLANT
TRAY FOLEY CATH 14FRSI W/METER (CATHETERS) ×2 IMPLANT
WATER STERILE IRR 1500ML POUR (IV SOLUTION) ×2 IMPLANT
WRAP KNEE MAXI GEL POST OP (GAUZE/BANDAGES/DRESSINGS) ×2 IMPLANT

## 2011-03-04 NOTE — Anesthesia Procedure Notes (Addendum)
Anesthesia Regional Block:  Femoral nerve block  Pre-Anesthetic Checklist: ,, timeout performed, Correct Patient, Correct Site, Correct Laterality, Correct Procedure, Correct Position, site marked, Risks and benefits discussed,  Surgical consent,  Pre-op evaluation,  At surgeon's request and post-op pain management  Laterality: Left  Prep: chloraprep       Needles:  Injection technique: Single-shot  Needle Type: Stimiplex     Needle Length: 10cm 10 cm     Additional Needles:  Procedures: ultrasound guided and nerve stimulator Femoral nerve block Narrative:  Start time: 03/04/2011 7:15 AM  Performed by: Personally  Anesthesiologist: Phillips Grout MD  Additional Notes: Patient tolerated the procedure well without complications  Femoral nerve block Date/Time: 03/04/2011 7:37 AM Performed by: Joycie Peek Pre-anesthesia Checklist: Patient identified, Suction available, Patient being monitored, Emergency Drugs available and Timeout performed Patient Re-evaluated:Patient Re-evaluated prior to inductionOxygen Delivery Method: Circle System Utilized Preoxygenation: Pre-oxygenation with 100% oxygen Intubation Type: IV induction Laryngoscope Size: Mac and 4 Grade View: Grade I Tube type: Oral Tube size: 7.5 mm Number of attempts: 1 Placement Confirmation: ETT inserted through vocal cords under direct vision,  positive ETCO2 and breath sounds checked- equal and bilateral Secured at: 23 cm Tube secured with: Tape Dental Injury: Teeth and Oropharynx as per pre-operative assessment

## 2011-03-04 NOTE — Anesthesia Postprocedure Evaluation (Signed)
  Anesthesia Post-op Note  Patient: [name]  Procedure(s) Performed:  TOTAL KNEE ARTHROPLASTY  Patient Location: PACU  Anesthesia Type: GA combined with regional for post-op pain  Level of Consciousness: awake and alert   Airway and Oxygen Therapy: Patient Spontanous Breathing  Post-op Pain: mild  Post-op Assessment: Post-op Vital signs reviewed, Patient's Cardiovascular Status Stable, Respiratory Function Stable, Patent Airway and No signs of Nausea or vomiting  Post-op Vital Signs: stable  Complications: No apparent anesthesia complications

## 2011-03-04 NOTE — Transfer of Care (Signed)
Immediate Anesthesia Transfer of Care Note  Patient: [name]  Procedure(s) Performed:  TOTAL KNEE ARTHROPLASTY  Patient Location: PACU  Anesthesia Type: General  Level of Consciousness: sedated  Airway & Oxygen Therapy: Patient Spontanous Breathing and Patient connected to face mask oxygen  Post-op Assessment: Report given to PACU RN and Post -op Vital signs reviewed and stable  Post vital signs: Reviewed and stable  Complications: No apparent anesthesia complications

## 2011-03-04 NOTE — Op Note (Signed)
NAME:  [name]                      MEDICAL RECORD NO.:  [id number]                             FACILITY:  [facility]      PHYSICIAN:  Madlyn Frankel. Charlann Boxer, M.D.  DATE OF BIRTH:  1950/05/05      DATE OF PROCEDURE:  03/04/2011                                     OPERATIVE REPORT         PREOPERATIVE DIAGNOSIS:  Left knee osteoarthritis.      POSTOPERATIVE DIAGNOSIS:  Left knee osteoarthritis.      FINDINGS:  The patient was noted to have complete loss of cartilage and   bone-on-bone arthritis with associated osteophytes in the medial and patellofemoral compartments of   the knee with a significant synovitis and associated effusion.      PROCEDURE:  Left total knee replacement.      COMPONENTS USED:  DePuy rotating platform posterior stabilized knee   system, a size 5 femur, 5 tibia, 12.5 mm insert, and 41 patellar   button.      SURGEON:  Madlyn Frankel. Charlann Boxer, M.D.      ASSISTANT:  Leilani Able, PA-C.      ANESTHESIA:  General and Regional.      SPECIMENS:  None.      COMPLICATION:  None.      DRAINS:  One Hemovac.  EBL: minimal      TOURNIQUET TIME:  29 minutes at .      The patient was stable to the recovery room.      INDICATION FOR PROCEDURE:  [name] is a 60 y.o. male patient of   mine.  The patient had been seen, evaluated, and treated conservatively in the   office with medication, activity modification, and injections.  The patient had   radiographic changes of bone-on-bone arthritis with endplate sclerosis and osteophytes noted.      The patient failed conservative measures including medication, injections, and activity modification, and at this point was ready for more definitive measures.   Based on the radiographic changes and failed conservative measures, the patient   decided to proceed with total knee replacement.  Risks of infection,   DVT, component failure, need for revision surgery, postop course, and   expectations were all   discussed and reviewed.  Consent was obtained for benefit of pain   relief.      PROCEDURE IN DETAIL:  The patient was brought to the operative theater.   Once adequate anesthesia, preoperative antibiotics, 2 gm of Ancef administered, the patient was positioned supine with the left thigh tourniquet placed.  The  left lower extremity was prepped and draped in sterile fashion.  A time-   out was performed identifying the patient, planned procedure, and   extremity.      The left lower extremity was placed in the [facility] leg holder.  The leg was   exsanguinated, tourniquet elevated to 250 mmHg.  A midline incision was   made followed by median parapatellar arthrotomy.  Following initial   exposure, attention was first directed to the patella.  Precut   measurement was noted to  be 26 mm.  I resected down to 15 mm and used a   41 patellar button to restore patellar height as well as cover the cut   surface.      The lug holes were drilled and a metal shim was placed to protect the   patella from retractors and saw blades.      At this point, attention was now directed to the femur.  The femoral   canal was opened with a drill, irrigated to try to prevent fat emboli.  An   intramedullary rod was passed at 5 degrees valgus, 11 mm of bone was   resected off the distal femur due to a preoperative flexion contracture.  Following this resection, the tibia was   subluxated anteriorly.  Using the extramedullary guide, 10 mm of bone was resected off   the proximal lateral tibia.  We confirmed the gap would be   stable medially and laterally with a 10 mm insert as well as confirmed   the cut was perpendicular in the coronal plane, checking with an alignment rod.      Once this was done, I sized the femur to be a size 5 in the anterior-   posterior dimension, chose a standard component based on medial and   lateral dimension.  The size 5 rotation block was then pinned in   position anterior referenced  using the C-clamp to set rotation.  The   anterior, posterior, and  chamfer cuts were made without difficulty nor   notching making certain that I was along the anterior cortex to help   with flexion gap stability.      The final box cut was made off the lateral aspect of distal femur.      At this point, the tibia was sized to be a size 5, the size 5 tray was   then pinned in position through the medial third of the tubercle,   drilled, and keel punched.  Trial reduction was now carried with a 5 femur,  5 tibia, a 12.5 mm insert, and the 41 patella botton.  The knee was brought to   extension, full extension with good flexion stability with the patella   tracking through the trochlea without application of pressure.  Given   all these findings, the trial components removed.  Final components were   opened and cement was mixed.  The knee was irrigated with normal saline   solution and pulse lavage.  The synovial lining was   then injected with 0.25% Marcaine with epinephrine and 1 cc of Toradol,   total of 61 cc.      The knee was irrigated.  Final implants were then cemented onto clean and   dried cut surfaces of bone with the knee brought to extension with a 12.5   mm trial insert.      Once the cement had fully cured, the excess cement was removed   throughout the knee.  I confirmed I was satisfied with the range of   motion and stability, and the final 12.5 mm insert was chosen.  It was   placed into the knee.      The tourniquet had been let down at 29 minutes.  No significant   hemostasis required.  The medium Hemovac drain was placed deep.  The   extensor mechanism was then reapproximated using #1 Vicryl with the knee   in flexion.  The   remaining wound was closed with  2-0 Vicryl and running 4-0 Monocryl.   The knee was cleaned, dried, dressed sterilely using Dermabond and   Aquacel dressing.  Drain site dressed separately.  The patient was then   brought to recovery room  in stable condition, tolerating the procedure   well.   Please note that Physician Assistant, Leilani Able, was present for the entirety of the case, and was utilized for pre-operative positioning, peri-operative retractor management, general facilitation of the procedure.  He was also utilized for primary wound closure at the end of the case.              Madlyn Frankel Charlann Boxer, M.D.

## 2011-03-04 NOTE — Interval H&P Note (Signed)
History and Physical Interval Note:  03/04/2011 7:21 AM  [name]  has presented today for surgery, with the diagnosis of Osteoarthritis of the Left Knee  The various methods of treatment have been discussed with the patient and family. After consideration of risks, benefits and other options for treatment, the patient has consented to  Procedure(s): LEFT TOTAL KNEE ARTHROPLASTY as a surgical intervention .  The patients' history has been reviewed, patient examined, no change in status, stable for surgery.  I have reviewed the patients' chart and labs.  Questions were answered to the patient's satisfaction.     Shelda Pal

## 2011-03-04 NOTE — Anesthesia Preprocedure Evaluation (Addendum)
Anesthesia Evaluation  Patient identified by MRN, date of birth, ID band Patient awake    Reviewed: Allergy & Precautions, H&P , NPO status , Patient's Chart, lab work & pertinent test results  Airway Mallampati: II TM Distance: >3 FB Neck ROM: Full    Dental No notable dental hx.    Pulmonary neg pulmonary ROS, asthma , sleep apnea ,  clear to auscultation  Pulmonary exam normal       Cardiovascular hypertension, Pt. on medications neg cardio ROS Regular Normal    Neuro/Psych Negative Neurological ROS  Negative Psych ROS   GI/Hepatic negative GI ROS, Neg liver ROS,   Endo/Other  Negative Endocrine ROSDiabetes mellitus-, Well Controlled, Insulin Dependent  Renal/GU negative Renal ROS  Genitourinary negative   Musculoskeletal negative musculoskeletal ROS (+)   Abdominal   Peds negative pediatric ROS (+)  Hematology negative hematology ROS (+)   Anesthesia Other Findings   Reproductive/Obstetrics negative OB ROS                          Anesthesia Physical Anesthesia Plan  ASA: II  Anesthesia Plan: General   Post-op Pain Management:    Induction: Intravenous  Airway Management Planned: Oral ETT  Additional Equipment:   Intra-op Plan:   Post-operative Plan: Extubation in OR  Informed Consent: I have reviewed the patients History and Physical, chart, labs and discussed the procedure including the risks, benefits and alternatives for the proposed anesthesia with the patient or authorized representative who has indicated his/her understanding and acceptance.   Dental advisory given  Plan Discussed with: CRNA  Anesthesia Plan Comments:         Anesthesia Quick Evaluation

## 2011-03-05 DIAGNOSIS — Z96659 Presence of unspecified artificial knee joint: Secondary | ICD-10-CM

## 2011-03-05 LAB — CBC
Platelets: 207 10*3/uL (ref 150–400)
RBC: 3.63 MIL/uL — ABNORMAL LOW (ref 4.22–5.81)
WBC: 9.6 10*3/uL (ref 4.0–10.5)

## 2011-03-05 LAB — GLUCOSE, CAPILLARY
Glucose-Capillary: 98 mg/dL (ref 70–99)
Glucose-Capillary: 206 mg/dL — ABNORMAL HIGH (ref 70–99)
Glucose-Capillary: 230 mg/dL — ABNORMAL HIGH (ref 70–99)

## 2011-03-05 LAB — BASIC METABOLIC PANEL
CO2: 29 mEq/L (ref 19–32)
Chloride: 101 mEq/L (ref 96–112)
GFR calc Af Amer: >90 mL/min (ref >90)
Potassium: 4.8 mEq/L (ref 3.5–5.1)
Sodium: 135 mEq/L (ref 135–145)

## 2011-03-05 MED ORDER — ASPIRIN EC 325 MG PO TBEC: 325.0000 mg | DELAYED_RELEASE_TABLET | Freq: Two times a day (BID) | ORAL | Status: AC

## 2011-03-05 MED ORDER — HYDROMORPHONE HCL 2 MG PO TABS: 2.0000 mg | ORAL_TABLET | ORAL | Status: AC | PRN

## 2011-03-05 NOTE — Progress Notes (Signed)
OT Screen Order received, chart reviewed, spoke at length with pt who had knee sx about 1 month ago. Pt has all necessary DME & prn A @ home from family. No f/u OT needed. Will sign off.  Garrel Ridgel, OTR/L  Pager [phone] 03/05/2011

## 2011-03-05 NOTE — Progress Notes (Signed)
Physical Therapy Evaluation Patient Details Name: JAHMANI STAUP MRN: [id number] DOB: 1950/09/18 Today's Date: 03/05/2011 [id number] eval 2  Problem List:  Patient Active Problem List  Diagnoses  . DIABETES MELLITUS, TYPE II, UNCONTROLLED  . HYPERLIPIDEMIA  . ANEMIA-IRON DEFICIENCY  . HYPERTENSION  . GALLSTONE PANCREATITIS  . ANEMIA, HX OF  . ERECTILE DYSFUNCTION, ORGANIC  . Herpes simplex type 2 infection  . S/P left total knee replacement    Past Medical History:  Past Medical History  Diagnosis Date  . Hyperlipidemia   . Diabetes mellitus   . Gallstone pancreatitis   . Asthma   . Pneumonia     2010  . Hepatitis     pancreatitis 2010  . Peripheral vascular disease     states after arthroscopy left and right was given blood thinners- had symptoms of blood clot but wasnt diagnosed  . Sleep apnea     study years ago/ no cpap/ states mild  . GERD (gastroesophageal reflux disease)   . Hypertension     eccho 6/10, EKG 1/11, clearence Dr Caryl Never from 11/28/10 on chart  . Arthritis     oa--and ra.  s/p right knee replacement on Jan 28, 2011--plans left knee replacement on 03/04/11.  also severe lower back pain-previous lumbar fusions, also pain in both hips, shoulders    Past Surgical History:  Past Surgical History  Procedure Date  . Cholecystectomy   . Colon surgery     removal of part of small intestine 1979  . Total knee arthroplasty 01/28/2011    Procedure: TOTAL KNEE ARTHROPLASTY;  Surgeon: Shelda Pal;  Location: [facility];  Service: Orthopedics;  Laterality: Right;  femoral nerve block preop.  . Back surgery     hx of multiple back surgeries including lumbar fusion S1-L3  . Shoulder surgery     bilateral   . Elbow surgery     bilateral  . Tubal ligation     PT Assessment/Plan/Recommendation PT Assessment Clinical Impression Statement: pt will benefit from PT to maximize independence PT Recommendation/Assessment: Patient will need skilled PT in the  acute care venue PT Problem List: Decreased strength;Decreased range of motion;Decreased activity tolerance;Decreased balance;Decreased mobility;Decreased knowledge of use of DME;Pain Barriers to Discharge: None PT Therapy Diagnosis : Difficulty walking;Acute pain PT Plan PT Frequency: 7X/week PT Treatment/Interventions: DME instruction;Gait training;Stair training;Functional mobility training;Therapeutic activities;Therapeutic exercise;Patient/family education PT Recommendation Follow Up Recommendations: Home health PT Equipment Recommended: None recommended by PT PT Goals  Acute Rehab PT Goals PT Goal Formulation: With patient Pt will go Supine/Side to Sit: with modified independence PT Goal: Supine/Side to Sit - Progress: Progressing toward goal Pt will go Sit to Stand: with modified independence PT Goal: Sit to Stand - Progress: Progressing toward goal Pt will go Stand to Sit: with modified independence PT Goal: Stand to Sit - Progress: Progressing toward goal Pt will Ambulate: 51 - 150 feet;with rolling walker;with modified independence PT Goal: Ambulate - Progress: Progressing toward goal Pt will Go Up / Down Stairs: 3-5 stairs;with least restrictive assistive device;with min assist PT Goal: Up/Down Stairs - Progress: Other (comment) Pt will Perform Home Exercise Program: with supervision, verbal cues required/provided PT Goal: Perform Home Exercise Program - Progress: Other (comment)  PT Evaluation Precautions/Restrictions  Precautions Precautions: Knee Precaution Comments: no pillow under knee Required Braces or Orthoses: Yes Knee Immobilizer: Discontinue once straight leg raise with < 10 degree lag Restrictions LLE Weight Bearing: Weight bearing as tolerated Prior Functioning  Home Living Lives With: Spouse  Receives Help From: Family Type of Home: House Home Layout: One level Home Access: Stairs to enter Entrance Stairs-Rails: None Entrance Stairs-Number of Steps:  5 Home Adaptive Equipment: Walker - rolling;Straight cane;Wheelchair - manual;Bedside commode/3-in-1 Prior Function Level of Independence: Independent with gait (uses walking stick sometimes) Driving: Yes Cognition Cognition Arousal/Alertness: Awake/alert Overall Cognitive Status: Appears within functional limits for tasks assessed Orientation Level: Oriented X4 Sensation/Coordination   Extremity Assessment RLE Assessment RLE Assessment:  (grossly WFL, recent RTKA) LLE Assessment LLE Assessment:  (able to assist with SLR, ankle WFL) Mobility (including Balance) Bed Mobility Supine to Sit: 4: Min assist Supine to Sit Details (indicate cue type and reason): cues for technique Transfers Sit to Stand: 4: Min assist;From bed;With upper extremity assist Sit to Stand Details (indicate cue type and reason): cues for hands and RLE placement Stand to Sit: 4: Min assist;With armrests Stand to Sit Details: cues for hands and RLE position Ambulation/Gait Ambulation/Gait Assistance: 4: Min assist Ambulation/Gait Assistance Details (indicate cue type and reason): cues for sequence Ambulation Distance (Feet): 120 Feet Assistive device: Rolling walker    Exercise  Total Joint Exercises Ankle Circles/Pumps: AROM;Both;5 reps Quad Sets: AROM;5 reps;Left End of Session PT - End of Session Activity Tolerance: Patient tolerated treatment well Patient left: in chair General Behavior During Session: [facility] for tasks performed Cognition: [facility] for tasks performed  [facility] 03/05/2011, 1:36 PM

## 2011-03-05 NOTE — Progress Notes (Signed)
Subjective: 1 Day Post-Op Procedure(s) (LRB): TOTAL KNEE ARTHROPLASTY (Left)   Patient reports pain as moderate. No events.   Objective:   VITALS:   Filed Vitals:   03/05/11 0545  BP: 137/77  Pulse: 73  Temp: 98.4 F (36.9 C)  Resp: 16    Neurovascular intact Dorsiflexion/Plantar flexion intact Incision: dressing C/D/I No cellulitis present Compartment soft  LABS  Basename 03/05/11 0400  HGB 11.0*  HCT 32.1*  WBC 9.6  PLT 207     Basename 03/05/11 0400  NA 135  K 4.8  BUN 13  CREATININE 1.03  GLUCOSE 145*    Assessment/Plan: 1 Day Post-Op Procedure(s) (LRB): TOTAL KNEE ARTHROPLASTY (Left)    Advance diet Up with therapy Discharge home with home health on Thursday/Friday.   [name]   PAC  03/05/2011, 8:48 AM

## 2011-03-05 NOTE — Discharge Summary (Signed)
Physician Discharge Summary  Patient ID: [name] MRN: [id number] DOB/AGE: 30-Nov-1950 60 y.o.  Admit date: 03/04/2011 Discharge date: 03/06/2011  Procedures:  Procedure(s) (LRB): TOTAL KNEE ARTHROPLASTY (Left)  Attending Physician: Shelda Pal   Admission Diagnoses: Osteoarthritis, left knee.    Discharge Diagnoses:  Principal Problem:  *S/P left total knee replacement Diabetes Hypertension Hyperlipidemia History of pancreatitis Sleep apnea though he does not use CPAP History of DVT Rheumatoid arthritis Chronic back pain  HPI: This is a 60 year old gentleman with a history of osteoarthritis of his right knee and left knees. He had a right TKA in October of 2012, he has done well with the surgery and wishes to proceed with the left knee. Pt has had failure of conservative treatment to alleviate his symptoms. X-rays show end-stage OA changes of the left knee. After discussion of treatment, benefits, risks, and options, the patient is now scheduled for total knee arthroplasty of his left knee. The surgery, risks, benefits, and aftercare were discussed in detail and understood. The patient's questions were invited and answered.   PCP: Kristian Covey, MD   CHRONIC PAIN MANAGEMENT: Dr. Thyra Breed  Discharged Condition: good  Hospital Course:  Patient underwent the above stated procedure on 03/04/2011. Patient tolerated the procedure well and brought to the recovery room in good condition and subsequently to the floor.  POD #1 AF, VSS Pt's foley was removed, as well as the hemovac drain removed. IV was changed to a saline lock. Patient reports pain as moderate. No events.  Neurovascular intact, dorsiflexion/plantar flexion intact, incision: dressing C/D/I, no cellulitis present and compartment soft.  LABS  Basename  03/05/11 0400   HGB  11.0*   HCT  32.1*    POD #2  BP: 157/83 ; Pulse: 95 ; Temp: 98.9 F Patient reports pain as mild. No events. Did have an  increase of pain over the night, but doing much better in the morning. Feels ready to be discharged home. Pain medication already addressed by pain management doctor. Neurovascular intact, dorsiflexion/plantar flexion intact, incision: dressing C/D/I, no cellulitis present and compartment soft.  LABS  Basename  03/06/11 0350   HGB  10.6*   HCT  31.0*    Discharge Exam: Extremities: Homans sign is negative, no sign of DVT, no edema, redness or tenderness in the calves or thighs and no ulcers, gangrene or trophic changes  Disposition: Home with HHPT, follow up in 2 weeks at [facility].  Discharge Orders    Future Appointments: Provider: Department: Dept Phone: Center:   04/18/2011 8:30 AM Elberta Fortis [facility] [phone] [facility]     Future Orders Please Complete By Expires   Diet - low sodium heart healthy      Call MD / Call 911      Comments:   If you experience chest pain or shortness of breath, CALL 911 and be transported to the hospital emergency room.  If you develope a fever above 101 F, pus (white drainage) or increased drainage or redness at the wound, or calf pain, call your surgeon's office.   Discharge instructions      Comments:   Maintain surgical dressing for 8 days, then replace with gauze and tape. Keep the area dry and clean until follow up. Follow up in 2 weeks at [facility]. Call with any questions or concerns.     Constipation Prevention      Comments:   Drink plenty of fluids.  Prune juice may be helpful.  You may use a stool softener, such as Colace (over the counter) 100 mg twice a day.  Use MiraLax (over the counter) for constipation as needed.   Increase activity slowly as tolerated      Weight Bearing as taught in Physical Therapy      Comments:   Use a walker or crutches as instructed.   Driving restrictions      Comments:   No driving for 4 weeks   TED hose      Comments:   Use stockings (TED hose) for 2 weeks on  both leg(s).  You may remove them at night for sleeping.   Change dressing      Comments:   Maintain surgical dressing for 8 days, then change the dressing daily with sterile 4 x 4 inch gauze dressing and tape.       Current Discharge Medication List    START taking these medications   Details  aspirin EC 325 MG tablet Take 1 tablet (325 mg total) by mouth 2 (two) times daily. X 4 weeks Qty: 60 tablet, Refills: 0    HYDROmorphone (DILAUDID) 2 MG tablet Take 1-2 tablets (2-4 mg total) by mouth every 4 (four) hours as needed. Qty: 30 tablet   Comments: Received from pain management doctor, prior to procedure.      CONTINUE these medications which have NOT CHANGED   Details  albuterol (VENTOLIN HFA) 108 (90 BASE) MCG/ACT inhaler Inhale 2 puffs into the lungs every 4 (four) hours as needed. Shortness of breath.    azelastine (ASTELIN) 137 MCG/SPRAY nasal spray Place 2 sprays into the nose 2 (two) times daily as needed. Allergies     docusate sodium (COLACE) 100 MG capsule Take 100 mg by mouth 3 (three) times daily as needed. Constipation     Fexofenadine-Pseudoephedrine (ALLEGRA-D 12 HOUR PO) Take 1 tablet by mouth daily.     insulin aspart (NOVOLOG) 100 UNIT/ML injection Inject 3-12 Units into the skin 3 (three) times daily before meals. 3 units 3 times a day, and pt uses sliding scale. 151-200 4 units, 201-250 6 units, 251-300 8 units, 301-350 10 units, 351-400 12 units.  9 vials for 90 day supply on average    insulin glargine (LANTUS) 100 UNIT/ML injection Inject 50 Units into the skin 2 (two) times daily.     lidocaine (LIDODERM) 5 % Place 1 patch onto the skin daily as needed. Remove & Discard patch within 12 hours or as directed by MD.  For pain.    lisinopril (PRINIVIL,ZESTRIL) 40 MG tablet Take 40 mg by mouth every morning.     metoprolol (LOPRESSOR) 100 MG tablet Take 100 mg by mouth every morning.     morphine (MS CONTIN) 60 MG 12 hr tablet Take 60 mg by mouth 3  (three) times daily.     polyethylene glycol (MIRALAX / GLYCOLAX) packet Take 17 g by mouth daily as needed. Constipation     simvastatin (ZOCOR) 40 MG tablet Take 40 mg by mouth at bedtime.     ferrous sulfate 325 (65 FE) MG tablet Take 1 tablet (325 mg total) by mouth 3 (three) times daily after meals. Refills: 0    glucose blood test strip 1 each by Other route. Use as instructed 5-6 times daily     Insulin Syringe-Needle U-100 (BD INSULIN SYRINGE ULTRAFINE) 31G X 5/16" 0.5 ML MISC Use daily as directed Qty: 100 each, Refills: 3      STOP taking these medications  HYDROcodone-acetaminophen (NORCO) 10-325 MG per tablet Comments:  Reason for Stopping:       tadalafil (CIALIS) 20 MG tablet Comments:  Reason for Stopping:         Signed:  Anastasio Auerbach. Anastasia Tompson   PAC  03/06/2011, 9:23 AM

## 2011-03-06 ENCOUNTER — Encounter (HOSPITAL_COMMUNITY): Payer: Self-pay | Admitting: Orthopedic Surgery

## 2011-03-06 LAB — CBC
HCT: 31 % — ABNORMAL LOW (ref 39.0–52.0)
Hemoglobin: 10.6 g/dL — ABNORMAL LOW (ref 13.0–17.0)
MCH: 29.7 pg (ref 26.0–34.0)
MCHC: 34.2 g/dL (ref 30.0–36.0)
RDW: 12.5 % (ref 11.5–15.5)

## 2011-03-06 LAB — BASIC METABOLIC PANEL
BUN: 16 mg/dL (ref 6–23)
Calcium: 9.2 mg/dL (ref 8.4–10.5)
GFR calc Af Amer: >90 mL/min (ref >90)
GFR calc non Af Amer: 89 mL/min — ABNORMAL LOW (ref >90)
Glucose, Bld: 198 mg/dL — ABNORMAL HIGH (ref 70–99)
Potassium: 4.3 mEq/L (ref 3.5–5.1)
Sodium: 132 mEq/L — ABNORMAL LOW (ref 135–145)

## 2011-03-06 MED ORDER — INSULIN ASPART 100 UNIT/ML ~~LOC~~ SOLN: 3.0000 [IU] | Freq: Three times a day (TID) | SUBCUTANEOUS | Status: DC

## 2011-03-06 MED ORDER — INSULIN ASPART 100 UNIT/ML ~~LOC~~ SOLN
4.0000 [IU] | Freq: Three times a day (TID) | SUBCUTANEOUS | Status: DC
  Administered 2011-03-06: 9 [IU] via SUBCUTANEOUS

## 2011-03-06 NOTE — Progress Notes (Signed)
Subjective: 2 Days Post-Op Procedure(s) (LRB): TOTAL KNEE ARTHROPLASTY (Left)   Patient reports pain as mild. No events. Did have an increase of pain over the night, but doing much better in the morning.  Objective:   VITALS:   Filed Vitals:   03/06/11 0539  BP: 157/83  Pulse: 95  Temp: 98.9 F (37.2 C)  Resp: 18    Neurovascular intact Dorsiflexion/Plantar flexion intact Incision: dressing C/D/I No cellulitis present Compartment soft  LABS  Basename 03/06/11 0350 03/05/11 0400  HGB 10.6* 11.0*  HCT 31.0* 32.1*  WBC 9.6 9.6  PLT 231 207     Basename 03/06/11 0350 03/05/11 0400  NA 132* 135  K 4.3 4.8  BUN 16 13  CREATININE 0.97 1.03  GLUCOSE 198* 145*    Assessment/Plan: 2 Days Post-Op Procedure(s) (LRB): TOTAL KNEE ARTHROPLASTY (Left)   Up with therapy Discharge home with home health today after PT   [name]. [name]   PAC  03/06/2011, 9:19 AM

## 2011-03-06 NOTE — Progress Notes (Signed)
Physical Therapy Treatment Patient Details Name: [name] MRN: [id number] DOB: January 25, 1951 Today's Date: 03/06/2011 9:35-10:00 G, TE  PT Assessment/Plan  PT - Assessment/Plan Comments on Treatment Session: 9/10 pain limited activity tolerance PT Plan: Discharge plan remains appropriate PT Frequency: 7X/week Follow Up Recommendations: Home health PT Equipment Recommended: None recommended by PT PT Goals  Acute Rehab PT Goals PT Goal Formulation: With patient Time For Goal Achievement: 4 days Pt will go Supine/Side to Sit: with modified independence PT Goal: Supine/Side to Sit - Progress: Partly met Pt will go Sit to Stand: with modified independence PT Goal: Sit to Stand - Progress: Partly met Pt will go Stand to Sit: with modified independence PT Goal: Stand to Sit - Progress: Partly met Pt will Ambulate: 51 - 150 feet;with rolling walker;with modified independence PT Goal: Ambulate - Progress: Met Pt will Go Up / Down Stairs: 3-5 stairs;with least restrictive assistive device;with min assist PT Goal: Up/Down Stairs - Progress: Not met Pt will Perform Home Exercise Program: with supervision, verbal cues required/provided PT Goal: Perform Home Exercise Program - Progress: Met  PT Treatment Precautions/Restrictions  Precautions Precautions: Knee Precaution Comments: no pillow under knee Required Braces or Orthoses: Yes Knee Immobilizer: Discontinue once straight leg raise with < 10 degree lag Restrictions Weight Bearing Restrictions: Yes LLE Weight Bearing: Weight bearing as tolerated Mobility (including Balance) Bed Mobility Supine to Sit: 4: Min assist Supine to Sit Details (indicate cue type and reason): pt 75%, assist to support LLE Transfers Transfers: Yes Sit to Stand: 4: Min assist;From bed;With upper extremity assist Sit to Stand Details (indicate cue type and reason): VCs hand placement Stand to Sit: 4: Min assist;With armrests Stand to Sit Details:  min A to support LLE Ambulation/Gait Ambulation/Gait Assistance: 6: Modified independent (Device/Increase time) Ambulation Distance (Feet): 130 Feet Assistive device: Rolling walker Stairs:  (pt declined stair training, remembers technique)    Exercise  Total Joint Exercises Ankle Circles/Pumps: AROM;Both;10 reps;Supine Quad Sets: AROM;Left;10 reps;Supine End of Session PT - End of Session Equipment Utilized During Treatment: Gait belt Activity Tolerance: Patient limited by pain Patient left: in chair;with call bell in reach;with family/visitor present Nurse Communication: Mobility status for transfers;Mobility status for ambulation General Behavior During Session: [facility] for tasks performed Cognition: [facility] for tasks performed  Tamala Ser 03/06/2011, 10:07 AM Tamala Ser PT 03/06/2011  [phone]

## 2011-03-06 NOTE — Progress Notes (Signed)
Discharge summary sent to payer through [facility]  

## 2011-03-06 NOTE — Progress Notes (Signed)
03/06/2011 Raynelle Bring BSN CCM [phone] CM SPOKE WITH PATIENT AND SPOUSE. Plans are for patient to go bck home where spouse will be caregiver upon discharge. Pt already has DME from recent prev surgery. Genevieve Norlander will provide HHPT with start of service tomorrow.

## 2011-03-21 ENCOUNTER — Telehealth: Payer: Self-pay | Admitting: Family Medicine

## 2011-03-21 MED ORDER — "INSULIN SYRINGE-NEEDLE U-100 31G X 5/16"" 0.5 ML MISC": Status: DC

## 2011-03-21 MED ORDER — LISINOPRIL 40 MG PO TABS: 40.0000 mg | ORAL_TABLET | ORAL | Status: DC

## 2011-03-21 NOTE — Telephone Encounter (Signed)
Pt is requesting refill on  lisinopril (PRINIVIL,ZESTRIL) 40 MG tablet Insulin Syringe-Needle U-100 (BD INSULIN SYRINGE ULTRAFINE) 31G X 5/16" 0.5 ML MISC   Pt is switching pharmacy to [facility] please contact pt regarding quantity before it gets called in

## 2011-03-21 NOTE — Telephone Encounter (Signed)
Rx sent to new pharmacy.

## 2011-03-27 ENCOUNTER — Telehealth: Payer: Self-pay | Admitting: Family Medicine

## 2011-03-27 MED ORDER — "INSULIN SYRINGE-NEEDLE U-100 31G X 5/16"" 0.5 ML MISC": Status: DC

## 2011-03-27 MED ORDER — LISINOPRIL 40 MG PO TABS: 40.0000 mg | ORAL_TABLET | ORAL | Status: DC

## 2011-03-27 NOTE — Telephone Encounter (Signed)
Please resend the 2 refills dated 02-2011 to [facility]. Thanks.

## 2011-04-18 ENCOUNTER — Encounter: Payer: Self-pay | Admitting: Family Medicine

## 2011-04-18 ENCOUNTER — Ambulatory Visit: Payer: BC Managed Care – PPO | Admitting: Family Medicine

## 2011-04-18 ENCOUNTER — Ambulatory Visit (INDEPENDENT_AMBULATORY_CARE_PROVIDER_SITE_OTHER): Payer: BC Managed Care – PPO | Admitting: Family Medicine

## 2011-04-18 DIAGNOSIS — IMO0002 Reserved for concepts with insufficient information to code with codable children: Secondary | ICD-10-CM

## 2011-04-18 DIAGNOSIS — I10 Essential (primary) hypertension: Secondary | ICD-10-CM

## 2011-04-18 DIAGNOSIS — E785 Hyperlipidemia, unspecified: Secondary | ICD-10-CM

## 2011-04-18 DIAGNOSIS — M1710 Unilateral primary osteoarthritis, unspecified knee: Secondary | ICD-10-CM

## 2011-04-18 DIAGNOSIS — D649 Anemia, unspecified: Secondary | ICD-10-CM

## 2011-04-18 DIAGNOSIS — IMO0001 Reserved for inherently not codable concepts without codable children: Secondary | ICD-10-CM

## 2011-04-18 LAB — CBC WITH DIFFERENTIAL/PLATELET
Basophils Absolute: 0 10*3/uL (ref 0.0–0.1)
Basophils Relative: 0.3 % (ref 0.0–3.0)
Eosinophils Absolute: 0.1 10*3/uL (ref 0.0–0.7)
Hemoglobin: 13.4 g/dL (ref 13.0–17.0)
Lymphs Abs: 2.5 10*3/uL (ref 0.7–4.0)
MCHC: 33.9 g/dL (ref 30.0–36.0)
MCV: 87.8 fl (ref 78.0–100.0)
Monocytes Absolute: 0.7 10*3/uL (ref 0.1–1.0)
Neutro Abs: 4.8 10*3/uL (ref 1.4–7.7)
RBC: 4.51 Mil/uL (ref 4.22–5.81)
RDW: 13.8 % (ref 11.5–14.6)

## 2011-04-18 LAB — HEPATIC FUNCTION PANEL
ALT: 20 U/L (ref 0–53)
AST: 20 U/L (ref 0–37)
Alkaline Phosphatase: 75 U/L (ref 39–117)
Bilirubin, Direct: 0 mg/dL (ref 0.0–0.3)
Total Bilirubin: 0.8 mg/dL (ref 0.3–1.2)

## 2011-04-18 LAB — LDL CHOLESTEROL, DIRECT: Direct LDL: 82 mg/dL

## 2011-04-18 NOTE — Progress Notes (Signed)
Subjective:    Patient ID: [name], male    DOB: 12-03-1950, 61 y.o.   MRN: [id number]  HPI  Medical followup. Patient has history of type 2 diabetes, hyperlipidemia, anemia following recent bilateral knee surgery, hypertension, and past history of gallstone pancreatitis. He had initial right knee surgery back in November and subsequently left knee surgery in December. He is still undergoing physical therapy. Overall improving. Had anemia following surgery with hemoglobin 10.6. No followup since then. Denies any dizziness or orthostasis.  Hypertension treated with metoprolol and lisinopril. Blood pressure well controlled by home readings. No peripheral edema issues  Type 2 diabetes. Recent fasting blood sugars have shown good control. Last A1c 7.0% back in October. Hyperlipidemia treated with simvastatin. No lipids in over one year. No myalgias.  Past Medical History  Diagnosis Date  . Hyperlipidemia   . Diabetes mellitus   . Gallstone pancreatitis   . Asthma   . Pneumonia     2010  . Hepatitis     pancreatitis 2010  . Peripheral vascular disease     states after arthroscopy left and right was given blood thinners- had symptoms of blood clot but wasnt diagnosed  . Sleep apnea     study years ago/ no cpap/ states mild  . GERD (gastroesophageal reflux disease)   . Hypertension     eccho 6/10, EKG 1/11, clearence Dr Caryl Never from 11/28/10 on chart  . Arthritis     oa--and ra.  s/p right knee replacement on Jan 28, 2011--plans left knee replacement on 03/04/11.  also severe lower back pain-previous lumbar fusions, also pain in both hips, shoulders    Past Surgical History  Procedure Date  . Cholecystectomy   . Colon surgery     removal of part of small intestine 1979  . Total knee arthroplasty 01/28/2011    Procedure: TOTAL KNEE ARTHROPLASTY;  Surgeon: Shelda Pal;  Location: [facility];  Service: Orthopedics;  Laterality: Right;  femoral nerve block preop.  . Back surgery      hx of multiple back surgeries including lumbar fusion S1-L3  . Shoulder surgery     bilateral   . Elbow surgery     bilateral  . Tubal ligation   . Total knee arthroplasty 03/04/2011    Procedure: TOTAL KNEE ARTHROPLASTY;  Surgeon: Shelda Pal;  Location: [facility];  Service: Orthopedics;  Laterality: Left;    reports that he has quit smoking. His smoking use included Cigars. He quit smokeless tobacco use about 20 years ago. He reports that he does not drink alcohol or use illicit drugs. family history is not on file. Allergies  Allergen Reactions  . Cortisone Acetate     REACTION: HBS, boils      Review of Systems  Constitutional: Negative for fatigue and unexpected weight change.  Eyes: Negative for visual disturbance.  Respiratory: Negative for cough, chest tightness and shortness of breath.   Cardiovascular: Negative for chest pain, palpitations and leg swelling.  Gastrointestinal: Negative for abdominal pain.  Genitourinary: Negative for dysuria.  Neurological: Negative for dizziness, syncope, weakness, light-headedness and headaches.       Objective:   Physical Exam  Constitutional: He is oriented to person, place, and time. He appears well-developed and well-nourished.  HENT:  Mouth/Throat: Oropharynx is clear and moist.  Neck: Neck supple. No thyromegaly present.  Cardiovascular: Normal rate and regular rhythm.   Pulmonary/Chest: Effort normal and breath sounds normal. No respiratory distress. He has no wheezes. He  has no rales.  Musculoskeletal: He exhibits no edema.  Lymphadenopathy:    He has no cervical adenopathy.  Neurological: He is alert and oriented to person, place, and time.          Assessment & Plan:  #1 type 2 diabetes. Recheck A1c. Continue current regimen with insulin. No recent hypoglycemia #2 hyperlipidemia. Recheck lipid and hepatic panel  #3 hypertension stable at goal by todays reading  #4 anemia following recent bilateral knee  surgery. Recheck CBC

## 2011-04-22 NOTE — Progress Notes (Signed)
Quick Note:  Pt informed on home VM ______ 

## 2011-04-28 ENCOUNTER — Telehealth: Payer: Self-pay | Admitting: Family Medicine

## 2011-04-28 MED ORDER — METOPROLOL TARTRATE 100 MG PO TABS: 100.0000 mg | ORAL_TABLET | ORAL | Status: DC

## 2011-04-28 NOTE — Telephone Encounter (Signed)
Needs new rx for Metoprolol 100mg  -[facility]. Thanks.

## 2011-07-17 ENCOUNTER — Ambulatory Visit: Payer: BC Managed Care – PPO | Admitting: Family Medicine

## 2011-07-23 ENCOUNTER — Other Ambulatory Visit: Payer: Self-pay | Admitting: Family Medicine

## 2011-07-29 ENCOUNTER — Ambulatory Visit: Payer: BC Managed Care – PPO | Admitting: Family Medicine

## 2011-08-05 ENCOUNTER — Ambulatory Visit (INDEPENDENT_AMBULATORY_CARE_PROVIDER_SITE_OTHER): Payer: BC Managed Care – PPO | Admitting: Family Medicine

## 2011-08-05 ENCOUNTER — Encounter: Payer: Self-pay | Admitting: Family Medicine

## 2011-08-05 VITALS — BP 130/78 | Temp 97.6°F | Wt 274.0 lb

## 2011-08-05 DIAGNOSIS — IMO0001 Reserved for inherently not codable concepts without codable children: Secondary | ICD-10-CM

## 2011-08-05 DIAGNOSIS — E785 Hyperlipidemia, unspecified: Secondary | ICD-10-CM

## 2011-08-05 DIAGNOSIS — I10 Essential (primary) hypertension: Secondary | ICD-10-CM

## 2011-08-05 MED ORDER — GLUCOSE BLOOD VI STRP: ORAL_STRIP | Status: DC

## 2011-08-05 NOTE — Progress Notes (Signed)
Subjective:    Patient ID: [name], male    DOB: [birth year], 61 y.o.   MRN: [id number]  HPI  Medical followup. Streaky of type 2 diabetes, hyperlipidemia, hypertension, history of pancreatitis, erectile dysfunction, and chronic back pain. He's had previous back surgery and continues to be followed by chronic pain management clinic. They've recently reduced his dose of morphine. Been more sedentary because of back issues past few months and has gained about 16 pounds. He's been more active over the past few weeks with gardening.  Blood sugars been stable but home readings. Currently uses [name] 47 units twice a day and [name]. No hypoglycemia. Last A1c 5.7%. Fairly compliant with diet. No symptoms of hyperglycemia.  Hyperlipidemia to simvastatin. No myalgias. Recent lipids at goal. Blood pressure medications include lisinopril metoprolol. No headaches or dizziness. No chest pain.  Past Medical History  Diagnosis Date  . Hyperlipidemia   . Diabetes mellitus   . Gallstone pancreatitis   . Asthma   . Pneumonia     2010  . Hepatitis     pancreatitis 2010  . Peripheral vascular disease     states after arthroscopy left and right was given blood thinners- had symptoms of blood clot but wasnt diagnosed  . Sleep apnea     study years ago/ no cpap/ states mild  . GERD (gastroesophageal reflux disease)   . Hypertension     eccho 6/10, EKG 1/11, clearence Dr Caryl Never from 11/28/10 on chart  . Arthritis     oa--and ra.  s/p right knee replacement on Jan 28, 2011--plans left knee replacement on 03/04/11.  also severe lower back pain-previous lumbar fusions, also pain in both hips, shoulders    Past Surgical History  Procedure Date  . Cholecystectomy   . Colon surgery     removal of part of small intestine 1979  . Total knee arthroplasty 01/28/2011    Procedure: TOTAL KNEE ARTHROPLASTY;  Surgeon: Shelda Pal;  Location: [facility];  Service: Orthopedics;  Laterality:  Right;  femoral nerve block preop.  . Back surgery     hx of multiple back surgeries including lumbar fusion S1-L3  . Shoulder surgery     bilateral   . Elbow surgery     bilateral  . Tubal ligation   . Total knee arthroplasty 03/04/2011    Procedure: TOTAL KNEE ARTHROPLASTY;  Surgeon: Shelda Pal;  Location: [facility];  Service: Orthopedics;  Laterality: Left;    reports that he has quit smoking. His smoking use included Cigars. He quit smokeless tobacco use about 20 years ago. He reports that he does not drink alcohol or use illicit drugs. family history is not on file. Allergies  Allergen Reactions  . Cortisone Acetate     REACTION: HBS, boils      Review of Systems  Constitutional: Negative for fatigue.  Eyes: Negative for visual disturbance.  Respiratory: Negative for cough, chest tightness and shortness of breath.   Cardiovascular: Negative for chest pain, palpitations and leg swelling.  Neurological: Negative for dizziness, syncope, weakness, light-headedness and headaches.       Objective:   Physical Exam  Constitutional: He is oriented to person, place, and time. He appears well-developed and well-nourished.  HENT:  Right Ear: External ear normal.  Left Ear: External ear normal.  Neck: Neck supple. No thyromegaly present.  Cardiovascular: Normal rate and regular rhythm.   Pulmonary/Chest: Effort normal and breath sounds normal. No respiratory distress. He has  no wheezes. He has no rales.  Musculoskeletal: He exhibits no edema.       Feet are slightly dry. Normal sensory function. Good dorsalis pedis pulses. No lesions.  Neurological: He is alert and oriented to person, place, and time.          Assessment & Plan:  #1 type 2 diabetes. Recent good control. Recheck A1c. Needs repeat eye exam which he is setting up #2 hyperlipidemia with recent lipids at goal. Continue simvastatin #3 hypertension. Stable. Needs to work on weight loss.

## 2011-08-06 NOTE — Progress Notes (Signed)
Quick Note:  Pt informed on home VM ______ 

## 2011-09-11 ENCOUNTER — Encounter: Payer: Self-pay | Admitting: Family Medicine

## 2011-09-11 ENCOUNTER — Ambulatory Visit (INDEPENDENT_AMBULATORY_CARE_PROVIDER_SITE_OTHER): Payer: BC Managed Care – PPO | Admitting: Family Medicine

## 2011-09-11 VITALS — BP 142/70 | Temp 97.8°F | Wt 276.0 lb

## 2011-09-11 DIAGNOSIS — L259 Unspecified contact dermatitis, unspecified cause: Secondary | ICD-10-CM

## 2011-09-11 MED ORDER — METHYLPREDNISOLONE ACETATE 80 MG/ML IJ SUSP
80.0000 mg | Freq: Once | INTRAMUSCULAR | Status: AC
  Administered 2011-09-11: 80 mg via INTRAMUSCULAR

## 2011-09-11 NOTE — Progress Notes (Signed)
  Subjective:    Patient ID: [name], male    DOB: 05/08/1950, 61 y.o.   MRN: [id number]  HPI  Acute visit. Pruritic rash right forearm. Does a lot of gardening. Onset about 2 weeks ago. Tried several topicals without improvement. No fever. No pain. No alleviating factors. Exacerbated by heat. Patient initially had reported history of cortisone allergies but apparently had a bole once with cortisone. No true allergy. Does have type 2 diabetes which has been fairly well controlled. He monitors blood sugars regularly.   Review of Systems  Constitutional: Negative for fever and chills.  Skin: Positive for rash.       Objective:   Physical Exam  Constitutional: He appears well-developed and well-nourished.  Cardiovascular: Normal rate and regular rhythm.   Pulmonary/Chest: Effort normal and breath sounds normal. No respiratory distress. He has no wheezes. He has no rales.  Skin:       Patient has slightly raised vesicular rash right forearm. Nontender.          Assessment & Plan:  Contact dermatitis. Discussed options. Patient elected to try intramuscular Depo-Medrol. He is aware this may elevate his sugar somewhat. Monitor closely.

## 2011-09-11 NOTE — Patient Instructions (Addendum)

## 2011-10-23 ENCOUNTER — Other Ambulatory Visit: Payer: Self-pay | Admitting: *Deleted

## 2011-10-23 MED ORDER — METOPROLOL SUCCINATE ER 100 MG PO TB24: 100.0000 mg | ORAL_TABLET | Freq: Every day | ORAL | Status: DC

## 2011-10-25 ENCOUNTER — Other Ambulatory Visit: Payer: Self-pay | Admitting: Family Medicine

## 2011-10-27 ENCOUNTER — Other Ambulatory Visit: Payer: Self-pay | Admitting: *Deleted

## 2011-10-27 MED ORDER — "INSULIN SYRINGE-NEEDLE U-100 31G X 5/16"" 0.5 ML MISC": Status: DC

## 2011-12-09 ENCOUNTER — Ambulatory Visit: Payer: BC Managed Care – PPO | Admitting: Family Medicine

## 2012-01-05 ENCOUNTER — Other Ambulatory Visit: Payer: Self-pay | Admitting: Family Medicine

## 2012-01-15 ENCOUNTER — Ambulatory Visit: Payer: BC Managed Care – PPO | Admitting: Family Medicine

## 2012-02-13 ENCOUNTER — Ambulatory Visit: Payer: BC Managed Care – PPO | Admitting: Family Medicine

## 2012-02-24 ENCOUNTER — Encounter: Payer: Self-pay | Admitting: Family Medicine

## 2012-02-24 ENCOUNTER — Ambulatory Visit (INDEPENDENT_AMBULATORY_CARE_PROVIDER_SITE_OTHER): Payer: BC Managed Care – PPO | Admitting: Family Medicine

## 2012-02-24 VITALS — BP 130/90 | Temp 98.0°F | Wt 276.0 lb

## 2012-02-24 DIAGNOSIS — Z23 Encounter for immunization: Secondary | ICD-10-CM

## 2012-02-24 DIAGNOSIS — I10 Essential (primary) hypertension: Secondary | ICD-10-CM

## 2012-02-24 DIAGNOSIS — E785 Hyperlipidemia, unspecified: Secondary | ICD-10-CM

## 2012-02-24 DIAGNOSIS — IMO0001 Reserved for inherently not codable concepts without codable children: Secondary | ICD-10-CM

## 2012-02-24 DIAGNOSIS — J309 Allergic rhinitis, unspecified: Secondary | ICD-10-CM

## 2012-02-24 LAB — HEPATIC FUNCTION PANEL
AST: 28 U/L (ref 0–37)
Albumin: 4.8 g/dL (ref 3.5–5.2)
Alkaline Phosphatase: 70 U/L (ref 39–117)
Bilirubin, Direct: 0.1 mg/dL (ref 0.0–0.3)
Total Bilirubin: 0.7 mg/dL (ref 0.3–1.2)

## 2012-02-24 LAB — LIPID PANEL
Cholesterol: 179 mg/dL (ref 0–200)
Total CHOL/HDL Ratio: 4
VLDL: 57.8 mg/dL — ABNORMAL HIGH (ref 0.0–40.0)

## 2012-02-24 LAB — BASIC METABOLIC PANEL
BUN: 19 mg/dL (ref 6–23)
CO2: 28 mEq/L (ref 19–32)
Chloride: 100 mEq/L (ref 96–112)
Creatinine, Ser: 1.1 mg/dL (ref 0.4–1.5)
Glucose, Bld: 211 mg/dL — ABNORMAL HIGH (ref 70–99)
Potassium: 4 mEq/L (ref 3.5–5.1)

## 2012-02-24 LAB — LDL CHOLESTEROL, DIRECT: Direct LDL: 108 mg/dL

## 2012-02-24 MED ORDER — FLUTICASONE PROPIONATE 50 MCG/ACT NA SUSP: 2.0000 | Freq: Every day | NASAL | Status: DC

## 2012-02-24 NOTE — Progress Notes (Signed)
Subjective:    Patient ID: [name], male    DOB: 04-15-1950, 61 y.o.   MRN: [id number]  HPI  Patient for medical followup. He's had multiple back surgeries and unfortunately some recurrent disc issues. He is followed by neurosurgeon. He is trying to postpone further surgery if possible. He has  bilateral radiculopathy symptoms. Followed by chronic pain management for quite some time. His pain is poorly controlled at this time.  Type 2 diabetes. Last A1c 7.2%. Fasting blood sugars been excellent over the past month though he has gained 2 pounds of weight since last visit. He remains on Lantus 40 units twice daily. No hypoglycemia.  Hypertension has been generally well controlled by home readings. Had some pain issues recently which he thinks may have exacerbated slightly. No dizziness.  Hyperlipidemia treated with simvastatin. No myalgias. Due for lab work. Patient's had some perennial allergies. He's taken astelin in past. Requesting Flonase. Frequent nasal congestive symptoms.  Past Medical History  Diagnosis Date  . Hyperlipidemia   . Diabetes mellitus   . Gallstone pancreatitis   . Asthma   . Pneumonia     2010  . Hepatitis     pancreatitis 2010  . Peripheral vascular disease     states after arthroscopy left and right was given blood thinners- had symptoms of blood clot but wasnt diagnosed  . Sleep apnea     study years ago/ no cpap/ states mild  . GERD (gastroesophageal reflux disease)   . Hypertension     eccho 6/10, EKG 1/11, clearence Dr Caryl Never from 11/28/10 on chart  . Arthritis     oa--and ra.  s/p right knee replacement on Jan 28, 2011--plans left knee replacement on 03/04/11.  also severe lower back pain-previous lumbar fusions, also pain in both hips, shoulders    Past Surgical History  Procedure Date  . Cholecystectomy   . Colon surgery     removal of part of small intestine 1979  . Total knee arthroplasty 01/28/2011    Procedure: TOTAL KNEE  ARTHROPLASTY;  Surgeon: Shelda Pal;  Location: [facility];  Service: Orthopedics;  Laterality: Right;  femoral nerve block preop.  . Back surgery     hx of multiple back surgeries including lumbar fusion S1-L3  . Shoulder surgery     bilateral   . Elbow surgery     bilateral  . Tubal ligation   . Total knee arthroplasty 03/04/2011    Procedure: TOTAL KNEE ARTHROPLASTY;  Surgeon: Shelda Pal;  Location: [facility];  Service: Orthopedics;  Laterality: Left;    reports that he has quit smoking. His smoking use included Cigars. He quit smokeless tobacco use about 21 years ago. He reports that he does not drink alcohol or use illicit drugs. family history is not on file. No Known Allergies    Review of Systems  Constitutional: Positive for fatigue.  Eyes: Negative for visual disturbance.  Respiratory: Negative for cough, chest tightness and shortness of breath.   Cardiovascular: Negative for chest pain, palpitations and leg swelling.  Musculoskeletal: Positive for back pain.  Neurological: Negative for dizziness, syncope, weakness, light-headedness and headaches.       Objective:   Physical Exam  Constitutional: He appears well-developed and well-nourished.  HENT:  Mouth/Throat: Oropharynx is clear and moist.  Neck: Neck supple. No thyromegaly present.  Cardiovascular: Normal rate and regular rhythm.   Pulmonary/Chest: Effort normal and breath sounds normal. No respiratory distress. He has no wheezes. He has no  rales.  Musculoskeletal: He exhibits no edema.       Feet generally dry. No ulcerations. Normal sensory function. 2+ dorsalis pedis pulses. Good capillary refill.  Normal sensory with monofilament testing          Assessment & Plan:  #1type 2 diabetes. History of fair control. Recheck A1c #2 hypertension. Marginal control day. Probably exacerbated by current pain issues. Continue close monitoring. Needs to lose some weight. #3 perennial allergies. Add Flonase nasal 2  sprays per nostril once daily #4 hyperlipidemia. Check lipid and hepatic panel

## 2012-02-26 NOTE — Progress Notes (Signed)
Quick Note:  Pt informed on personally identified VM ______ 

## 2012-04-04 ENCOUNTER — Other Ambulatory Visit: Payer: Self-pay | Admitting: Family Medicine

## 2012-04-18 ENCOUNTER — Other Ambulatory Visit: Payer: Self-pay | Admitting: Family Medicine

## 2012-04-22 ENCOUNTER — Other Ambulatory Visit: Payer: Self-pay | Admitting: Family Medicine

## 2012-05-25 ENCOUNTER — Ambulatory Visit: Payer: BC Managed Care – PPO | Admitting: Family Medicine

## 2012-06-04 ENCOUNTER — Ambulatory Visit: Payer: BC Managed Care – PPO | Admitting: Family Medicine

## 2012-07-02 ENCOUNTER — Encounter: Payer: Self-pay | Admitting: Family Medicine

## 2012-07-02 ENCOUNTER — Ambulatory Visit (INDEPENDENT_AMBULATORY_CARE_PROVIDER_SITE_OTHER): Payer: BC Managed Care – PPO | Admitting: Family Medicine

## 2012-07-02 VITALS — BP 140/90 | Temp 97.3°F | Wt 277.0 lb

## 2012-07-02 DIAGNOSIS — J309 Allergic rhinitis, unspecified: Secondary | ICD-10-CM

## 2012-07-02 DIAGNOSIS — N529 Male erectile dysfunction, unspecified: Secondary | ICD-10-CM

## 2012-07-02 DIAGNOSIS — IMO0001 Reserved for inherently not codable concepts without codable children: Secondary | ICD-10-CM

## 2012-07-02 DIAGNOSIS — I10 Essential (primary) hypertension: Secondary | ICD-10-CM

## 2012-07-02 LAB — HEMOGLOBIN A1C: Hgb A1c MFr Bld: 7.8 % — ABNORMAL HIGH (ref 4.6–6.5)

## 2012-07-02 MED ORDER — FEXOFENADINE-PSEUDOEPHED ER 60-120 MG PO TB12: 1.0000 | ORAL_TABLET | Freq: Two times a day (BID) | ORAL | Status: AC

## 2012-07-02 MED ORDER — GLUCOSE BLOOD VI STRP: ORAL_STRIP | Status: DC

## 2012-07-02 NOTE — Progress Notes (Signed)
Subjective:    Patient ID: [name], male    DOB: [birth year], 62 y.o.   MRN: [id number]  HPI Medical followup Chronic medical problems including history of type 2 diabetes, hypertension, hyperlipidemia, erectile dysfunction, osteoarthritis, pancreatitis, and chronic low back pain. Followed by chronic pain management. Back pain has been poorly controlled. Recent increase in morphine dose. Has been less active over the winter time.  Blood sugars elevated with fastings around 150. Last A1c 7.0% No symptoms of hyperglycemia or hypoglycemia. Lantus 50 units twice daily takes NovoLog sliding scale with meals  History of erectile dysfunction. Requesting samples of Cialis. No history of nitroglycerin use.  Patient has seasonal allergies and requesting Allegra-D 12 hour. He takes this infrequently. Blood pressures have been stable by home readings.  Past Medical History  Diagnosis Date  . Hyperlipidemia   . Diabetes mellitus   . Gallstone pancreatitis   . Asthma   . Pneumonia     2010  . Hepatitis     pancreatitis 2010  . Peripheral vascular disease     states after arthroscopy left and right was given blood thinners- had symptoms of blood clot but wasnt diagnosed  . Sleep apnea     study years ago/ no cpap/ states mild  . GERD (gastroesophageal reflux disease)   . Hypertension     eccho 6/10, EKG 1/11, clearence Dr Caryl Never from 11/28/10 on chart  . Arthritis     oa--and ra.  s/p right knee replacement on Jan 28, 2011--plans left knee replacement on 03/04/11.  also severe lower back pain-previous lumbar fusions, also pain in both hips, shoulders    Past Surgical History  Procedure Laterality Date  . Cholecystectomy    . Colon surgery      removal of part of small intestine 1979  . Total knee arthroplasty  01/28/2011    Procedure: TOTAL KNEE ARTHROPLASTY;  Surgeon: Shelda Pal;  Location: [facility];  Service: Orthopedics;  Laterality: Right;  femoral nerve block preop.  .  Back surgery      hx of multiple back surgeries including lumbar fusion S1-L3  . Shoulder surgery      bilateral   . Elbow surgery      bilateral  . Tubal ligation    . Total knee arthroplasty  03/04/2011    Procedure: TOTAL KNEE ARTHROPLASTY;  Surgeon: Shelda Pal;  Location: [facility];  Service: Orthopedics;  Laterality: Left;    reports that he has quit smoking. His smoking use included Cigars. He quit smokeless tobacco use about 21 years ago. He reports that he does not drink alcohol or use illicit drugs. family history is not on file. No Known Allergies    Review of Systems  Constitutional: Negative for fatigue and unexpected weight change.  Eyes: Negative for visual disturbance.  Respiratory: Negative for cough, chest tightness and shortness of breath.   Cardiovascular: Negative for chest pain, palpitations and leg swelling.  Neurological: Negative for dizziness, syncope, weakness, light-headedness and headaches.       Objective:   Physical Exam  Constitutional: He appears well-developed and well-nourished.  HENT:  Right Ear: External ear normal.  Left Ear: External ear normal.  Nose: Nose normal.  Mouth/Throat: Oropharynx is clear and moist.  Neck: Neck supple. No thyromegaly present.  Cardiovascular: Normal rate and regular rhythm.  Exam reveals no gallop.   No murmur heard. Pulmonary/Chest: Effort normal and breath sounds normal. No respiratory distress. He has no wheezes. He has no  rales.  Abdominal: Soft. Bowel sounds are normal. He exhibits no distension and no mass. There is no tenderness. There is no rebound and no guarding.  Musculoskeletal: He exhibits no edema.          Assessment & Plan:  #1 type 2 diabetes. Recent decreasing controlled by home readings. Recheck A1c. Patient needs to lose some weight. #2 erectile dysfunction. Samples of Cialis 20 mg every other day as needed #3 hypertension. Slightly elevated today. Control bottom rings. Work on  [employer] loss #4 seasonal allergies. Refill of Allegra-D 12 hour for as needed use.

## 2012-07-06 NOTE — Progress Notes (Signed)
Quick Note:  Pt informed on personally identified VM ______ 

## 2012-10-01 ENCOUNTER — Ambulatory Visit (INDEPENDENT_AMBULATORY_CARE_PROVIDER_SITE_OTHER): Payer: BC Managed Care – PPO | Admitting: Family Medicine

## 2012-10-01 ENCOUNTER — Encounter: Payer: Self-pay | Admitting: Family Medicine

## 2012-10-01 VITALS — BP 140/72 | HR 74 | Temp 97.6°F | Ht 76.0 in | Wt 283.0 lb

## 2012-10-01 DIAGNOSIS — B351 Tinea unguium: Secondary | ICD-10-CM

## 2012-10-01 DIAGNOSIS — I10 Essential (primary) hypertension: Secondary | ICD-10-CM

## 2012-10-01 DIAGNOSIS — IMO0001 Reserved for inherently not codable concepts without codable children: Secondary | ICD-10-CM

## 2012-10-01 LAB — HEMOGLOBIN A1C: Hgb A1c MFr Bld: 7.6 % — ABNORMAL HIGH (ref 4.6–6.5)

## 2012-10-01 LAB — HM DIABETES EYE EXAM

## 2012-10-01 LAB — HEPATIC FUNCTION PANEL: Albumin: 4.3 g/dL (ref 3.5–5.2)

## 2012-10-01 LAB — HM DIABETES FOOT EXAM: HM Diabetic Foot Exam: NORMAL

## 2012-10-01 MED ORDER — TERBINAFINE HCL 250 MG PO TABS: 250.0000 mg | ORAL_TABLET | Freq: Every day | ORAL | Status: DC

## 2012-10-01 NOTE — Progress Notes (Signed)
Subjective:    Patient ID: [name], male    DOB: 22-Jun-1950, 62 y.o.   MRN: [id number]  HPI Medical followup Type 2 diabetes. Last A1c 7.8%. Fasting blood sugars have improved recently. More physically active. Fasting blood sugars consistently around 100 or less. No hypoglycemia. He has eye exam set up for next week. Compliant with medications  Onychomycosis changes involving all nails of left hand and both feet. He like to go ahead with systemic treatment. He has not had any recent liver issues. Remote history of pancreatitis.  Hypertension treated with metoprolol and lisinopril. He had normal renal function. No recent dizziness.  Past Medical History  Diagnosis Date  . Hyperlipidemia   . Diabetes mellitus   . Gallstone pancreatitis   . Asthma   . Pneumonia     2010  . Hepatitis     pancreatitis 2010  . Peripheral vascular disease     states after arthroscopy left and right was given blood thinners- had symptoms of blood clot but wasnt diagnosed  . Sleep apnea     study years ago/ no cpap/ states mild  . GERD (gastroesophageal reflux disease)   . Hypertension     eccho 6/10, EKG 1/11, clearence Dr Caryl Never from 11/28/10 on chart  . Arthritis     oa--and ra.  s/p right knee replacement on Jan 28, 2011--plans left knee replacement on 03/04/11.  also severe lower back pain-previous lumbar fusions, also pain in both hips, shoulders    Past Surgical History  Procedure Laterality Date  . Cholecystectomy    . Colon surgery      removal of part of small intestine 1979  . Total knee arthroplasty  01/28/2011    Procedure: TOTAL KNEE ARTHROPLASTY;  Surgeon: Shelda Pal;  Location: [facility];  Service: Orthopedics;  Laterality: Right;  femoral nerve block preop.  . Back surgery      hx of multiple back surgeries including lumbar fusion S1-L3  . Shoulder surgery      bilateral   . Elbow surgery      bilateral  . Tubal ligation    . Total knee arthroplasty  03/04/2011   Procedure: TOTAL KNEE ARTHROPLASTY;  Surgeon: Shelda Pal;  Location: [facility];  Service: Orthopedics;  Laterality: Left;    reports that he has quit smoking. His smoking use included Cigars. He quit smokeless tobacco use about 21 years ago. He reports that he does not drink alcohol or use illicit drugs. family history is not on file. No Known Allergies    Review of Systems  Constitutional: Positive for fatigue. Negative for appetite change and unexpected weight change.  Eyes: Negative for visual disturbance.  Respiratory: Negative for cough, chest tightness and shortness of breath.   Cardiovascular: Negative for chest pain, palpitations and leg swelling.  Neurological: Negative for dizziness, syncope, weakness, light-headedness and headaches.  Hematological: Negative for adenopathy.       Objective:   Physical Exam  Constitutional: He appears well-developed and well-nourished.  Neck: Neck supple. No thyromegaly present.  Cardiovascular: Normal rate and regular rhythm.   Pulmonary/Chest: Effort normal and breath sounds normal. No respiratory distress. He has no wheezes. He has no rales.  Musculoskeletal: He exhibits no edema.  Skin:  Feet are dry. No lesions. Normal sensory function with monofilament testing. He has significant severe brittle nail changes involving all nails of the left hand and he has thickened dystrophic nails involving both feet  Assessment & Plan:  #1 type 2 diabetes. Recheck A1c. Continue with eye exam. Continue weight loss efforts #2 onychomycosis changes involving left hand and both feet. Check hepatic panel. If normal, start Lamisil 250 mg once daily for 3 months #3 hypertension. Marginal control. Work on weight loss

## 2012-10-01 NOTE — Patient Instructions (Addendum)
Ringworm, Nail A fungal infection of the nail (tinea unguium/onychomycosis) is common. It is common as the visible part of the nail is composed of dead cells which have no blood supply to help prevent infection. It occurs because fungi are everywhere and will pick any opportunity to grow on any dead material. Because nails are very slow growing they require up to 2 years of treatment with anti-fungal medications. The entire nail back to the base is infected. This includes approximately  of the nail which you cannot see. If your caregiver has prescribed a medication by mouth, take it every day and as directed. No progress will be seen for at least 6 to 9 months. Do not be disappointed! Because fungi live on dead cells with little or no exposure to blood supply, medication delivery to the infection is slow; thus the cure is slow. It is also why you can observe no progress in the first 6 months. The nail becoming cured is the base of the nail, as it has the blood supply. Topical medication such as creams and ointments are usually not effective. Important in successful treatment of nail fungus is closely following the medication regimen that your doctor prescribes. Sometimes you and your caregiver may elect to speed up this process by surgical removal of all the nails. Even this may still require 6 to 9 months of additional oral medications. See your caregiver as directed. Remember there will be no visible improvement for at least 6 months. See your caregiver sooner if other signs of infection (redness and swelling) develop. Document Released: 03/07/2000 Document Revised: 06/02/2011 Document Reviewed: 05/16/2008 [employer] Patient Information 2014 [employer].  

## 2012-10-15 ENCOUNTER — Telehealth: Payer: Self-pay | Admitting: Family Medicine

## 2012-10-15 NOTE — Telephone Encounter (Signed)
Left message for patient to return call.

## 2012-10-15 NOTE — Telephone Encounter (Signed)
Returning call to get lab results.

## 2012-11-29 ENCOUNTER — Other Ambulatory Visit: Payer: Self-pay | Admitting: Family Medicine

## 2012-11-30 ENCOUNTER — Other Ambulatory Visit: Payer: Self-pay | Admitting: Family Medicine

## 2012-12-31 ENCOUNTER — Ambulatory Visit: Payer: BC Managed Care – PPO | Admitting: Family Medicine

## 2013-01-28 ENCOUNTER — Ambulatory Visit: Payer: BC Managed Care – PPO | Admitting: Family Medicine

## 2013-02-08 ENCOUNTER — Other Ambulatory Visit: Payer: Self-pay

## 2013-02-08 MED ORDER — GLUCOSE BLOOD VI STRP: ORAL_STRIP | Status: DC

## 2013-02-15 ENCOUNTER — Ambulatory Visit (INDEPENDENT_AMBULATORY_CARE_PROVIDER_SITE_OTHER): Payer: BC Managed Care – PPO | Admitting: Internal Medicine

## 2013-02-15 ENCOUNTER — Telehealth: Payer: Self-pay | Admitting: Family Medicine

## 2013-02-15 ENCOUNTER — Encounter: Payer: Self-pay | Admitting: Internal Medicine

## 2013-02-15 VITALS — BP 160/90 | HR 69 | Temp 98.1°F | Resp 20 | Wt 280.0 lb

## 2013-02-15 DIAGNOSIS — Z23 Encounter for immunization: Secondary | ICD-10-CM

## 2013-02-15 DIAGNOSIS — IMO0001 Reserved for inherently not codable concepts without codable children: Secondary | ICD-10-CM

## 2013-02-15 DIAGNOSIS — I10 Essential (primary) hypertension: Secondary | ICD-10-CM

## 2013-02-15 NOTE — Progress Notes (Signed)
Subjective:    Patient ID: [name], male    DOB: [birth year], 62 y.o.   MRN: [id number]  HPI Pre-visit discussion using our clinic review tool. No additional management support is needed unless otherwise documented below in the visit note.  62 year old patient who has diabetes and hypertension who was seen today following trauma to the left index finger that occurred earlier today. The patient is active with  [employer] and suffered a traumatic injury at the base of the left index finger.  He cleansed the wound and applied antibiotic ointment.  He has no significant pain  Past Medical History  Diagnosis Date  . Hyperlipidemia   . Diabetes mellitus   . Gallstone pancreatitis   . Asthma   . Pneumonia     2010  . Hepatitis     pancreatitis 2010  . Peripheral vascular disease     states after arthroscopy left and right was given blood thinners- had symptoms of blood clot but wasnt diagnosed  . Sleep apnea     study years ago/ no cpap/ states mild  . GERD (gastroesophageal reflux disease)   . Hypertension     eccho 6/10, EKG 1/11, clearence Dr Caryl Never from 11/28/10 on chart  . Arthritis     oa--and ra.  s/p right knee replacement on Jan 28, 2011--plans left knee replacement on 03/04/11.  also severe lower back pain-previous lumbar fusions, also pain in both hips, shoulders     History   Social History  . Marital Status: Married    Spouse Name: N/A    Number of Children: N/A  . Years of Education: N/A   Occupational History  . Not on file.   Social History Main Topics  . Smoking status: Former Smoker    Types: Cigars  . Smokeless tobacco: Former Neurosurgeon    Quit date: 01/22/1991  . Alcohol Use: No  . Drug Use: No  . Sexual Activity: Not on file   Other Topics Concern  . Not on file   Social History Narrative  . No narrative on file    Past Surgical History  Procedure Laterality Date  . Cholecystectomy    . Colon surgery      removal of part of small  intestine 1979  . Total knee arthroplasty  01/28/2011    Procedure: TOTAL KNEE ARTHROPLASTY;  Surgeon: Shelda Pal;  Location: [facility];  Service: Orthopedics;  Laterality: Right;  femoral nerve block preop.  . Back surgery      hx of multiple back surgeries including lumbar fusion S1-L3  . Shoulder surgery      bilateral   . Elbow surgery      bilateral  . Tubal ligation    . Total knee arthroplasty  03/04/2011    Procedure: TOTAL KNEE ARTHROPLASTY;  Surgeon: Shelda Pal;  Location: [facility];  Service: Orthopedics;  Laterality: Left;    No family history on file.  No Known Allergies  Current Outpatient Prescriptions on File Prior to Visit  Medication Sig Dispense Refill  . azelastine (ASTELIN) 137 MCG/SPRAY nasal spray Place 2 sprays into the nose 2 (two) times daily as needed. Allergies       . celecoxib (CELEBREX) 200 MG capsule Take 200 mg by mouth daily.      Marland Kitchen docusate sodium (COLACE) 100 MG capsule Take 100 mg by mouth 3 (three) times daily as needed. Constipation       . Fexofenadine-Pseudoephedrine (ALLEGRA-D 12 HOUR PO) Take 1  tablet by mouth daily.       . fexofenadine-pseudoephedrine (ALLEGRA-D 12 HOUR) 60-120 MG per tablet Take 1 tablet by mouth 2 (two) times daily.  60 tablet  6  . fluticasone (FLONASE) 50 MCG/ACT nasal spray Place 2 sprays into the nose daily.  16 g  6  . glucose blood test strip Pt testing BS in the Am and PM  90 each  3  . HYDROcodone-acetaminophen (NORCO) 10-325 MG per tablet       . insulin glargine (LANTUS) 100 UNIT/ML injection       . Insulin Syringe-Needle U-100 (BD INSULIN SYRINGE ULTRAFINE) 31G X 5/16" 0.5 ML MISC Use daily as directed  500 each  5  . LANTUS 100 UNIT/ML injection INJECT 40 UNITS TWICE DAILY  80 mL  4  . lidocaine (LIDODERM) 5 % Place 1 patch onto the skin daily as needed. Remove & Discard patch within 12 hours or as directed by MD.  For pain.      Marland Kitchen lisinopril (PRINIVIL,ZESTRIL) 40 MG tablet TAKE 1 TABLET EVERY MORNING  90  tablet  3  . metoprolol succinate (TOPROL-XL) 100 MG 24 hr tablet Take 1 tablet (100 mg total) by mouth daily. Take with or immediately following a meal.  90 tablet  3  . metoprolol succinate (TOPROL-XL) 100 MG 24 hr tablet TAKE 1 TABLET EVERY MORNING  90 tablet  3  . morphine (MS CONTIN) 60 MG 12 hr tablet Take 60 mg by mouth 3 (three) times daily.       Marland Kitchen NOVOLOG 100 UNIT/ML injection INJECT 3 UNITS 3 TIMES DAILY ALSO USE SLIDING SCALE  60 mL  11  . polyethylene glycol (MIRALAX / GLYCOLAX) packet Take 17 g by mouth daily as needed. Constipation       . simvastatin (ZOCOR) 40 MG tablet TAKE 1 TABLET BY MOUTH EVERY DAY  90 tablet  3   No current facility-administered medications on file prior to visit.    BP 160/90  Pulse 69  Temp(Src) 98.1 F (36.7 C) (Oral)  Resp 20  Wt 280 lb (127.007 kg)  SpO2 97%     Review of Systems  Skin: Positive for wound.       Objective:   Physical Exam  Constitutional: He appears well-developed and well-nourished. No distress.  Skin:  Stellate laceration at the base of the left index finger palmar aspect approximately 1 cm in diameter           Assessment & Plan:   Laceration/crush injury left hand. Local wound care discussed. The wound was cleaned antibiotic ointment applied and dressed

## 2013-02-15 NOTE — Patient Instructions (Signed)
Call or return to clinic prn if these symptoms worsen or fail to improve as anticipated.  Notify the office if you develops signs of infection including increasing pain redness or drainage

## 2013-02-15 NOTE — Progress Notes (Signed)
Pre-visit discussion using our clinic review tool. No additional management support is needed unless otherwise documented below in the visit note.  

## 2013-02-15 NOTE — Telephone Encounter (Signed)
Patient Information:  Caller Name: Izear  Phone: [phone]  Patient: [name], [name]  Gender: Male  DOB: [birth year]  Age: 62 Years  PCP: Evelena Peat (Family Practice)  Office Follow Up:  Does the office need to follow up with this patient?: Yes  Instructions For The Office: He declines ER /cannot afford to go.  He wants to be seen in the office tomorrow.  He has home pain medicaiton that he is on regular basis. Patient will need a Tetanus shot and evaluation.  Pharmacy [facility]. PLEASE CONTACT PATIENT. DECLINES ER WANTS TO BE SEEN IN OFFICE TOMORROW  RN Note:  He declines ER /cannot afford to go.  He wants to be seen in the office tomorrow.  He has home pain medicaiton that he is on regular basis. Patient will need a Tetanus shot and evaluation.  Pharmacy [facility]. PLEASE CONTACT PATIENT. 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 and 1/2 inch into the finger.   No pain, bleeding controlled. Onset at 09;00.  He is a diabetic . Last Tetanus is 1973.  able to move finger, no numbness or tingling, finger is warm , +ROM, and no bone involvement.  Reviewed Health History In EMR: Yes  Reviewed Medications In EMR: Yes  Reviewed Allergies In EMR: Yes  Reviewed Surgeries / Procedures: Yes  Date of Onset of Symptoms: 02/15/2013  Treatments Tried: Water, peroxide, antibiotic ointment  Treatments Tried Worked: No  Guideline(s) Used:  Finger Injury  Disposition Per Guideline:   Go to ED Now (or to Office with PCP Approval)  Reason For Disposition Reached:   Skin is split open or gaping (length > 1/2 inch or 12 mm)  Advice Given:  Keep the Hand Elevated:  When you are up walking around, try not to let the hand hang down. If you do you will  notice that the finger hurts and throbs more.  When you are sitting down reading or watching TV, place your arm up on a pillow. This puts (elevates) the hand above the heart.  This can also help decrease swelling and pain.  Apply a Cold Pack:  Apply a cold pack or an ice bag (wrapped in a moist towel) to the area for 20 minutes. Repeat in 1 hour, then every 4 hours while awake.  Continue this for the first 48 hours after an injury.  This will help decrease pain and swelling.  Rest vs. Movement:  Movement is generally more healing in the long term than rest.  Continue normal activities as much as your pain permits.  If it really hurts to use the finger at all, you will need to see the doctor.  Call Back If:  Pain becomes severe  Pain does not improve after 3 days  You become worse.  RN Overrode Recommendation:  Make Appointment  He declines ER /cannot afford to go.  He wants to be seen in the office tomorrow.  He has home pain medicaiton that he is on regular basis. Patient will need a Tetanus shot and evaluation.  Pharmacy [facility]. PLEASE CONTACT PATIENT. DECLINES ER WANTS TO BE SEEN IN OFFICE TOMORROW

## 2013-02-15 NOTE — Telephone Encounter (Signed)
Ov with dr Kirtland Bouchard at 3:35-wife states she can arrive in 10 minutes

## 2013-03-02 ENCOUNTER — Ambulatory Visit: Payer: BC Managed Care – PPO | Admitting: Family Medicine

## 2013-03-03 ENCOUNTER — Encounter: Payer: Self-pay | Admitting: Family Medicine

## 2013-03-03 ENCOUNTER — Ambulatory Visit (INDEPENDENT_AMBULATORY_CARE_PROVIDER_SITE_OTHER): Payer: BC Managed Care – PPO | Admitting: Family Medicine

## 2013-03-03 VITALS — BP 130/68 | HR 71 | Temp 97.7°F | Wt 286.0 lb

## 2013-03-03 DIAGNOSIS — IMO0001 Reserved for inherently not codable concepts without codable children: Secondary | ICD-10-CM

## 2013-03-03 DIAGNOSIS — E785 Hyperlipidemia, unspecified: Secondary | ICD-10-CM

## 2013-03-03 DIAGNOSIS — E669 Obesity, unspecified: Secondary | ICD-10-CM | POA: Insufficient documentation

## 2013-03-03 DIAGNOSIS — Z23 Encounter for immunization: Secondary | ICD-10-CM

## 2013-03-03 DIAGNOSIS — I10 Essential (primary) hypertension: Secondary | ICD-10-CM

## 2013-03-03 LAB — HEPATIC FUNCTION PANEL
AST: 28 U/L (ref 0–37)
Albumin: 4.7 g/dL (ref 3.5–5.2)
Alkaline Phosphatase: 70 U/L (ref 39–117)
Bilirubin, Direct: 0.1 mg/dL (ref 0.0–0.3)

## 2013-03-03 LAB — LIPID PANEL
Cholesterol: 184 mg/dL (ref 0–200)
HDL: 41.9 mg/dL (ref >39.00)
Total CHOL/HDL Ratio: 4
Triglycerides: 363 mg/dL — ABNORMAL HIGH (ref 0.0–149.0)
VLDL: 72.6 mg/dL — ABNORMAL HIGH (ref 0.0–40.0)

## 2013-03-03 LAB — BASIC METABOLIC PANEL
CO2: 29 mEq/L (ref 19–32)
Chloride: 102 mEq/L (ref 96–112)
Sodium: 138 mEq/L (ref 135–145)

## 2013-03-03 NOTE — Progress Notes (Signed)
Pre visit review using our clinic review tool, if applicable. No additional management support is needed unless otherwise documented below in the visit note. 

## 2013-03-03 NOTE — Progress Notes (Signed)
Subjective:    Patient ID: [name], male    DOB: [birth year], 62 y.o.   MRN: [id number]  HPI Present for medical followup. History of obesity, type 2 diabetes, hypertension, hyperlipidemia, erectile dysfunction. He has history of recurrent pancreatitis in the past. He had episode of mild recurrent several months ago. None recently.  Type 2 diabetes. Last A1c 7.6%. Currently takes Lantus 100 units daily and NovoLog insulin sliding scale. Fasting blood sugars consistently run 130-140. Not checking postprandials. No recent hypoglycemia. Eye exam last July unremarkable  Patient still needs flu vaccine. He denies any recent chest pains. No dizziness. Compliant with all medications. Needs to have lipids followed up at this time. Recent onychomycosis involving fingernails and toenails. Hands have improved but unfortunately his toenails did not improve much.  Past Medical History  Diagnosis Date  . Hyperlipidemia   . Diabetes mellitus   . Gallstone pancreatitis   . Asthma   . Pneumonia     2010  . Hepatitis     pancreatitis 2010  . Peripheral vascular disease     states after arthroscopy left and right was given blood thinners- had symptoms of blood clot but wasnt diagnosed  . Sleep apnea     study years ago/ no cpap/ states mild  . GERD (gastroesophageal reflux disease)   . Hypertension     eccho 6/10, EKG 1/11, clearence Dr Caryl Never from 11/28/10 on chart  . Arthritis     oa--and ra.  s/p right knee replacement on Jan 28, 2011--plans left knee replacement on 03/04/11.  also severe lower back pain-previous lumbar fusions, also pain in both hips, shoulders    Past Surgical History  Procedure Laterality Date  . Cholecystectomy    . Colon surgery      removal of part of small intestine 1979  . Total knee arthroplasty  01/28/2011    Procedure: TOTAL KNEE ARTHROPLASTY;  Surgeon: Shelda Pal;  Location: [facility];  Service: Orthopedics;  Laterality: Right;  femoral nerve block  preop.  . Back surgery      hx of multiple back surgeries including lumbar fusion S1-L3  . Shoulder surgery      bilateral   . Elbow surgery      bilateral  . Tubal ligation    . Total knee arthroplasty  03/04/2011    Procedure: TOTAL KNEE ARTHROPLASTY;  Surgeon: Shelda Pal;  Location: [facility];  Service: Orthopedics;  Laterality: Left;    reports that he has quit smoking. His smoking use included Cigars. He quit smokeless tobacco use about 22 years ago. He reports that he does not drink alcohol or use illicit drugs. family history is not on file. No Known Allergies    Review of Systems  Constitutional: Negative for fatigue and unexpected weight change.  Eyes: Negative for visual disturbance.  Respiratory: Negative for cough, chest tightness and shortness of breath.   Cardiovascular: Negative for chest pain, palpitations and leg swelling.  Endocrine: Negative for polydipsia and polyuria.  Neurological: Negative for dizziness, syncope, weakness, light-headedness and headaches.       Objective:   Physical Exam  Constitutional: He appears well-developed and well-nourished.  Neck: Neck supple. No thyromegaly present.  Cardiovascular: Normal rate and regular rhythm.   Pulmonary/Chest: Effort normal and breath sounds normal. No respiratory distress. He has no wheezes. He has no rales.  Musculoskeletal: He exhibits no edema.          Assessment & Plan:  #1 type  2 diabetes. History of fair control. Recheck A1c. Needs work on weight loss. Flu vaccine given. #2 hyperlipidemia. Check lipid and hepatic panel #3 hypertension which is stable at goal. Continue with lisinopril and metoprolol.

## 2013-03-03 NOTE — Patient Instructions (Signed)
Try to lose some weight. Check some postprandial blood sugars with goal of < 180.

## 2013-03-20 ENCOUNTER — Other Ambulatory Visit: Payer: Self-pay | Admitting: Family Medicine

## 2013-04-16 ENCOUNTER — Other Ambulatory Visit: Payer: Self-pay | Admitting: Family Medicine

## 2013-04-24 ENCOUNTER — Other Ambulatory Visit: Payer: Self-pay | Admitting: Family Medicine

## 2013-07-05 ENCOUNTER — Ambulatory Visit (INDEPENDENT_AMBULATORY_CARE_PROVIDER_SITE_OTHER): Payer: BC Managed Care – PPO | Admitting: Family Medicine

## 2013-07-05 ENCOUNTER — Encounter: Payer: Self-pay | Admitting: Family Medicine

## 2013-07-05 VITALS — BP 142/82 | HR 73 | Wt 285.0 lb

## 2013-07-05 DIAGNOSIS — I10 Essential (primary) hypertension: Secondary | ICD-10-CM

## 2013-07-05 DIAGNOSIS — IMO0001 Reserved for inherently not codable concepts without codable children: Secondary | ICD-10-CM

## 2013-07-05 DIAGNOSIS — B351 Tinea unguium: Secondary | ICD-10-CM

## 2013-07-05 DIAGNOSIS — E1165 Type 2 diabetes mellitus with hyperglycemia: Principal | ICD-10-CM

## 2013-07-05 DIAGNOSIS — Z79899 Other long term (current) drug therapy: Secondary | ICD-10-CM

## 2013-07-05 LAB — HEPATIC FUNCTION PANEL
ALBUMIN: 4.5 g/dL (ref 3.5–5.2)
ALK PHOS: 66 U/L (ref 39–117)
ALT: 40 U/L (ref 0–53)
AST: 34 U/L (ref 0–37)
Bilirubin, Direct: 0.1 mg/dL (ref 0.0–0.3)
Total Bilirubin: 0.9 mg/dL (ref 0.3–1.2)
Total Protein: 7.3 g/dL (ref 6.0–8.3)

## 2013-07-05 LAB — HEMOGLOBIN A1C: HEMOGLOBIN A1C: 7.7 % — AB (ref 4.6–6.5)

## 2013-07-05 MED ORDER — TERBINAFINE HCL 250 MG PO TABS: 250.0000 mg | ORAL_TABLET | Freq: Every day | ORAL | Status: DC

## 2013-07-05 NOTE — Progress Notes (Signed)
Pre visit review using our clinic review tool, if applicable. No additional management support is needed unless otherwise documented below in the visit note. 

## 2013-07-05 NOTE — Progress Notes (Signed)
Subjective:    Patient ID: [name], male    DOB: 1950/08/16, 63 y.o.   MRN: [id number]  HPI Medical followup. His chronic problems include history of obesity, type 2 diabetes, hypertension, hyperlipidemia,  onychomycosis, osteoarthritis, history of pancreatitis. He's had some recent cervical neck problems and is looking at possible cervical disc surgery in the coming months.  Type 2 diabetes. History of marginal control. He has increased his NovoLog sliding scale insulin slightly. He has had rare episodes of hypoglycemia. He remains on Lantus 50 units twice daily. He had renal function last visit which was normal. He is currently not taking metformin or any other oral diabetes medications  History of onychomycosis involving fingernails and toenails. He was treated last year with Lamisil had virtual resolution of fungal changes of fingernails but not toenails. He is requesting second treatment at this point. He has not had any recent hepatic dysfunction.  Past Medical History  Diagnosis Date  . Hyperlipidemia   . Diabetes mellitus   . Gallstone pancreatitis   . Asthma   . Pneumonia     2010  . Hepatitis     pancreatitis 2010  . Peripheral vascular disease     states after arthroscopy left and right was given blood thinners- had symptoms of blood clot but wasnt diagnosed  . Sleep apnea     study years ago/ no cpap/ states mild  . GERD (gastroesophageal reflux disease)   . Hypertension     eccho 6/10, EKG 1/11, clearence Dr Caryl NeverBurchette from 11/28/10 on chart  . Arthritis     oa--and ra.  s/p right knee replacement on Jan 28, 2011--plans left knee replacement on 03/04/11.  also severe lower back pain-previous lumbar fusions, also pain in both hips, shoulders    Past Surgical History  Procedure Laterality Date  . Cholecystectomy    . Colon surgery      removal of part of small intestine 1979  . Total knee arthroplasty  01/28/2011    Procedure: TOTAL KNEE ARTHROPLASTY;   Surgeon: Shelda PalMatthew D Olin;  Location: [facility];  Service: Orthopedics;  Laterality: Right;  femoral nerve block preop.  . Back surgery      hx of multiple back surgeries including lumbar fusion S1-L3  . Shoulder surgery      bilateral   . Elbow surgery      bilateral  . Tubal ligation    . Total knee arthroplasty  03/04/2011    Procedure: TOTAL KNEE ARTHROPLASTY;  Surgeon: Shelda PalMatthew D Olin;  Location: [facility];  Service: Orthopedics;  Laterality: Left;    reports that he has quit smoking. His smoking use included Cigars. He quit smokeless tobacco use about 22 years ago. He reports that he does not drink alcohol or use illicit drugs. family history is not on file. No Known Allergies    Review of Systems  Constitutional: Positive for fatigue.  Eyes: Negative for visual disturbance.  Respiratory: Negative for cough, chest tightness and shortness of breath.   Cardiovascular: Negative for chest pain, palpitations and leg swelling.  Endocrine: Negative for polydipsia and polyuria.  Musculoskeletal: Positive for arthralgias and back pain.  Neurological: Negative for dizziness, syncope, weakness, light-headedness and headaches.       Objective:   Physical Exam  Constitutional: He is oriented to person, place, and time. He appears well-developed and well-nourished.  Neck: Neck supple. No thyromegaly present.  Cardiovascular: Normal rate and regular rhythm.   Pulmonary/Chest: Effort normal and breath sounds  normal.  Musculoskeletal: He exhibits no edema.  Neurological: He is alert and oriented to person, place, and time.  Skin:  Patient has thickened dysmorphic changes of several toenails. No skin lesions.          Assessment & Plan:  #1 type 2 diabetes. History of marginal control. Work on weight loss. Recheck A1c. Consider addition of either metformin or other oral medication (such as Invokana) if A1c still elevated #2 hypertension which has been controlled by home readings. Continue  current medications #3 onychomycosis involving several toenails. Check hepatic panel. If normal start back Lamisil 250 mg once daily for 3 months

## 2013-07-06 ENCOUNTER — Telehealth: Payer: Self-pay | Admitting: Family Medicine

## 2013-07-06 NOTE — Telephone Encounter (Signed)
Relevant patient education mailed to patient.  

## 2013-07-19 ENCOUNTER — Telehealth: Payer: Self-pay

## 2013-07-19 NOTE — Telephone Encounter (Signed)
Relevant patient education mailed to patient.  

## 2013-07-22 ENCOUNTER — Other Ambulatory Visit: Payer: Self-pay

## 2013-07-22 MED ORDER — CANAGLIFLOZIN 100 MG PO TABS: 100.0000 mg | ORAL_TABLET | Freq: Every day | ORAL | Status: DC

## 2013-10-07 ENCOUNTER — Telehealth: Payer: Self-pay | Admitting: Family Medicine

## 2013-10-07 MED ORDER — NYSTATIN 100000 UNIT/GM EX CREA: 1.0000 "application " | TOPICAL_CREAM | Freq: Two times a day (BID) | CUTANEOUS | Status: DC

## 2013-10-07 NOTE — Telephone Encounter (Signed)
Try Nystatin cream twice daily and office follow up if not clearing in one week.

## 2013-10-07 NOTE — Telephone Encounter (Signed)
Pt wife said he is having pain ,swelling at tip of his penis and part of his urethra. Pt wife said she think the med  Canagliflozin (INVOKANA) 100 MG TABS is causing this since this is one of the side effects. Pt would like to know if something can be called did not feel he needed to come into the office for an appt

## 2013-10-07 NOTE — Telephone Encounter (Signed)
Returned call and left detailed message on VM

## 2013-10-07 NOTE — Telephone Encounter (Signed)
Left message for patient to return call. [facility]

## 2013-10-19 ENCOUNTER — Telehealth: Payer: Self-pay | Admitting: Family Medicine

## 2013-10-19 MED ORDER — CANAGLIFLOZIN 100 MG PO TABS: 100.0000 mg | ORAL_TABLET | Freq: Every day | ORAL | Status: DC

## 2013-10-19 NOTE — Telephone Encounter (Signed)
Rx sent to pharmacy   

## 2013-10-19 NOTE — Telephone Encounter (Signed)
[id number] - [place], [place] - [address] [facility] [address] is requesting 90 day re-fill on Canagliflozin (INVOKANA) 100 MG TABS

## 2013-11-04 ENCOUNTER — Encounter: Payer: Self-pay | Admitting: Family Medicine

## 2013-11-04 ENCOUNTER — Ambulatory Visit (INDEPENDENT_AMBULATORY_CARE_PROVIDER_SITE_OTHER): Payer: BC Managed Care – PPO | Admitting: Family Medicine

## 2013-11-04 VITALS — BP 130/80 | HR 72 | Wt 278.0 lb

## 2013-11-04 DIAGNOSIS — E1165 Type 2 diabetes mellitus with hyperglycemia: Principal | ICD-10-CM

## 2013-11-04 DIAGNOSIS — IMO0001 Reserved for inherently not codable concepts without codable children: Secondary | ICD-10-CM

## 2013-11-04 DIAGNOSIS — B351 Tinea unguium: Secondary | ICD-10-CM

## 2013-11-04 DIAGNOSIS — I10 Essential (primary) hypertension: Secondary | ICD-10-CM

## 2013-11-04 LAB — HEMOGLOBIN A1C: Hgb A1c MFr Bld: 7.3 % — ABNORMAL HIGH (ref 4.6–6.5)

## 2013-11-04 NOTE — Progress Notes (Signed)
Pre visit review using our clinic review tool, if applicable. No additional management support is needed unless otherwise documented below in the visit note. 

## 2013-11-04 NOTE — Progress Notes (Signed)
Subjective:    Patient ID: [name], male    DOB: [birth year], 63 y.o.   MRN: [id number]  HPI Patient is seen for medical followup  Type 2 diabetes. History of fair control but suboptimal. Most recent A1c 7.7%. We added Invokana.  He remains on insulin. We have been reluctant to use metformin (even though he has decent renal function) because of question of prior pancreatitis related to this. Blood sugars have improved somewhat. His weight is down 7 pounds. Blood sugar still fluctuate somewhat. He did have one episode of some dysuria but none over the past couple months  Hypertension which is treated with lisinopril and metoprolol. Blood pressure stable. No headaches. No dizziness. No chest pains.  Onychomycosis. Started Lamisil last visit. Fingernails and toenails are improving. He had no side effects.  Past Medical History  Diagnosis Date  . Hyperlipidemia   . Diabetes mellitus   . Gallstone pancreatitis   . Asthma   . Pneumonia     2010  . Hepatitis     pancreatitis 2010  . Peripheral vascular disease     states after arthroscopy left and right was given blood thinners- had symptoms of blood clot but wasnt diagnosed  . Sleep apnea     study years ago/ no cpap/ states mild  . GERD (gastroesophageal reflux disease)   . Hypertension     eccho 6/10, EKG 1/11, clearence Dr Caryl Never from 11/28/10 on chart  . Arthritis     oa--and ra.  s/p right knee replacement on Jan 28, 2011--plans left knee replacement on 03/04/11.  also severe lower back pain-previous lumbar fusions, also pain in both hips, shoulders    Past Surgical History  Procedure Laterality Date  . Cholecystectomy    . Colon surgery      removal of part of small intestine 1979  . Total knee arthroplasty  01/28/2011    Procedure: TOTAL KNEE ARTHROPLASTY;  Surgeon: Shelda Pal;  Location: [facility];  Service: Orthopedics;  Laterality: Right;  femoral nerve block preop.  . Back surgery      hx of multiple back  surgeries including lumbar fusion S1-L3  . Shoulder surgery      bilateral   . Elbow surgery      bilateral  . Tubal ligation    . Total knee arthroplasty  03/04/2011    Procedure: TOTAL KNEE ARTHROPLASTY;  Surgeon: Shelda Pal;  Location: [facility];  Service: Orthopedics;  Laterality: Left;    reports that he has quit smoking. His smoking use included Cigars. He quit smokeless tobacco use about 22 years ago. He reports that he does not drink alcohol or use illicit drugs. family history is not on file. No Known Allergies    Review of Systems  Constitutional: Negative for fatigue and unexpected weight change.  Eyes: Negative for visual disturbance.  Respiratory: Negative for cough, chest tightness and shortness of breath.   Cardiovascular: Negative for chest pain, palpitations and leg swelling.  Endocrine: Negative for polydipsia and polyuria.  Neurological: Negative for dizziness, syncope, weakness, light-headedness and headaches.       Objective:   Physical Exam  Constitutional: He appears well-developed and well-nourished. No distress.  Neck: Neck supple. No thyromegaly present.  Cardiovascular: Normal rate and regular rhythm.   Pulmonary/Chest: Effort normal and breath sounds normal. No respiratory distress. He has no wheezes. He has no rales.  Musculoskeletal: He exhibits no edema.  Assessment & Plan:  #1 type 2 diabetes. History of fair control. Recheck A1c. Continue weight loss efforts #2 onychomycosis. Improving. Finish out Lamisil. #3 hypertension which is improved compared to last visit. Continue current medications above. #4 health maintenance.  Reminder for flu vaccine this fall

## 2013-11-04 NOTE — Patient Instructions (Signed)
Continue weight loss efforts. Remember flu vaccine this Fall.

## 2013-11-17 ENCOUNTER — Telehealth: Payer: Self-pay | Admitting: Family Medicine

## 2013-11-17 NOTE — Telephone Encounter (Signed)
Pt informed that form was already faxed. I will refax the form.

## 2013-11-17 NOTE — Telephone Encounter (Signed)
Pt called she wants to know if a form she left here to be filled out has been faxed. The form was for a health screening .Marland Kitchen

## 2013-12-09 ENCOUNTER — Other Ambulatory Visit: Payer: Self-pay | Admitting: Family Medicine

## 2013-12-12 ENCOUNTER — Telehealth: Payer: Self-pay | Admitting: Family Medicine

## 2013-12-12 MED ORDER — INSULIN ASPART 100 UNIT/ML ~~LOC~~ SOLN: SUBCUTANEOUS | Status: DC

## 2013-12-12 NOTE — Telephone Encounter (Signed)
Rx sent to pharmacy   

## 2013-12-12 NOTE — Telephone Encounter (Signed)
[id number] - [place], [place] - [address] [facility] [address] is requesting 90 day re-fill on NOVOLOG 100 UNIT/ML injection

## 2014-02-06 ENCOUNTER — Encounter: Payer: Self-pay | Admitting: Family Medicine

## 2014-02-06 ENCOUNTER — Ambulatory Visit (INDEPENDENT_AMBULATORY_CARE_PROVIDER_SITE_OTHER): Payer: BC Managed Care – PPO

## 2014-02-06 ENCOUNTER — Ambulatory Visit (INDEPENDENT_AMBULATORY_CARE_PROVIDER_SITE_OTHER): Payer: BC Managed Care – PPO | Admitting: Family Medicine

## 2014-02-06 VITALS — BP 134/80 | HR 69 | Temp 97.6°F | Wt 280.0 lb

## 2014-02-06 DIAGNOSIS — E785 Hyperlipidemia, unspecified: Secondary | ICD-10-CM

## 2014-02-06 DIAGNOSIS — E119 Type 2 diabetes mellitus without complications: Secondary | ICD-10-CM

## 2014-02-06 DIAGNOSIS — I10 Essential (primary) hypertension: Secondary | ICD-10-CM

## 2014-02-06 DIAGNOSIS — B009 Herpesviral infection, unspecified: Secondary | ICD-10-CM

## 2014-02-06 DIAGNOSIS — Z23 Encounter for immunization: Secondary | ICD-10-CM

## 2014-02-06 NOTE — Progress Notes (Signed)
Subjective:    Patient ID: [name], male    DOB: [birth year], 63 y.o.   MRN: [id number]  HPI  Patient seen for medical follow-up.  Type 2 diabetes. He has had marginal control mostly in the 7 range for quite some time. He has recently started exercising Sometimes cycling up to 90 minutes per day. He has not lost any weight but has lost inches in his waist. His CBGs improved and occasional hypoglycemia. He remains on Lantus 50 units twice daily and also NovoLog with meals. He did not tolerate Invokana secondary to urinary tract infection.  He continues to be followed through pain management clinic regarding his chronic back pain. He has been now tolerate cycling. He has dyslipidemia treated with simvastatin. Blood pressures been stable on lisinopril and metoprolol. Already had flu vaccine  Past Medical History  Diagnosis Date  . Hyperlipidemia   . Diabetes mellitus   . Gallstone pancreatitis   . Asthma   . Pneumonia     2010  . Hepatitis     pancreatitis 2010  . Peripheral vascular disease     states after arthroscopy left and right was given blood thinners- had symptoms of blood clot but wasnt diagnosed  . Sleep apnea     study years ago/ no cpap/ states mild  . GERD (gastroesophageal reflux disease)   . Hypertension     eccho 6/10, EKG 1/11, clearence Dr Caryl NeverBurchette from 11/28/10 on chart  . Arthritis     oa--and ra.  s/p right knee replacement on Jan 28, 2011--plans left knee replacement on 03/04/11.  also severe lower back pain-previous lumbar fusions, also pain in both hips, shoulders    Past Surgical History  Procedure Laterality Date  . Cholecystectomy    . Colon surgery      removal of part of small intestine 1979  . Total knee arthroplasty  01/28/2011    Procedure: TOTAL KNEE ARTHROPLASTY;  Surgeon: Shelda PalMatthew D Olin;  Location: [facility];  Service: Orthopedics;  Laterality: Right;  femoral nerve block preop.  . Back surgery      hx of multiple back surgeries  including lumbar fusion S1-L3  . Shoulder surgery      bilateral   . Elbow surgery      bilateral  . Tubal ligation    . Total knee arthroplasty  03/04/2011    Procedure: TOTAL KNEE ARTHROPLASTY;  Surgeon: Shelda PalMatthew D Olin;  Location: [facility];  Service: Orthopedics;  Laterality: Left;    reports that he has quit smoking. His smoking use included Cigars. He quit smokeless tobacco use about 23 years ago. He reports that he does not drink alcohol or use illicit drugs. family history is not on file. No Known Allergies   Review of Systems  Constitutional: Negative for fatigue.  Eyes: Negative for visual disturbance.  Respiratory: Negative for cough, chest tightness and shortness of breath.   Cardiovascular: Negative for chest pain, palpitations and leg swelling.  Endocrine: Negative for polydipsia and polyuria.  Neurological: Negative for dizziness, syncope, weakness, light-headedness and headaches.       Objective:   Physical Exam  Constitutional: He appears well-developed and well-nourished.  Neck: Neck supple. No thyromegaly present.  Cardiovascular: Normal rate and regular rhythm.   Pulmonary/Chest: Effort normal and breath sounds normal. No respiratory distress. He has no wheezes. He has no rales.  Musculoskeletal: He exhibits no edema.          Assessment & Plan:  #1 type  2 diabetes. Improving control by home readings. Recheck A1c. Continue regular exercise habits #2 hypertension which is stable. He is encouraged to continue daily aerobic exercise as tolerated  #3 dyslipidemia. Ordered future fasting lipids and hepatic panel

## 2014-02-06 NOTE — Progress Notes (Signed)
Pre visit review using our clinic review tool, if applicable. No additional management support is needed unless otherwise documented below in the visit note. 

## 2014-02-10 ENCOUNTER — Other Ambulatory Visit (INDEPENDENT_AMBULATORY_CARE_PROVIDER_SITE_OTHER): Payer: BC Managed Care – PPO

## 2014-02-10 DIAGNOSIS — I10 Essential (primary) hypertension: Secondary | ICD-10-CM

## 2014-02-10 DIAGNOSIS — E119 Type 2 diabetes mellitus without complications: Secondary | ICD-10-CM

## 2014-02-10 DIAGNOSIS — E785 Hyperlipidemia, unspecified: Secondary | ICD-10-CM

## 2014-02-10 LAB — BASIC METABOLIC PANEL
BUN: 19 mg/dL (ref 6–23)
CALCIUM: 9.2 mg/dL (ref 8.4–10.5)
CO2: 27 meq/L (ref 19–32)
Chloride: 104 mEq/L (ref 96–112)
Creatinine, Ser: 1.1 mg/dL (ref 0.4–1.5)
GFR: 71.56 mL/min (ref >60.00)
GLUCOSE: 170 mg/dL — AB (ref 70–99)
Potassium: 4.8 mEq/L (ref 3.5–5.1)
Sodium: 140 mEq/L (ref 135–145)

## 2014-02-10 LAB — HEPATIC FUNCTION PANEL
ALK PHOS: 65 U/L (ref 39–117)
ALT: 28 U/L (ref 0–53)
AST: 27 U/L (ref 0–37)
Albumin: 4.6 g/dL (ref 3.5–5.2)
BILIRUBIN TOTAL: 1.1 mg/dL (ref 0.2–1.2)
Bilirubin, Direct: 0.1 mg/dL (ref 0.0–0.3)
Total Protein: 7 g/dL (ref 6.0–8.3)

## 2014-02-10 LAB — LIPID PANEL
CHOL/HDL RATIO: 4
Cholesterol: 165 mg/dL (ref 0–200)
HDL: 47.1 mg/dL (ref >39.00)
LDL Cholesterol: 91 mg/dL (ref 0–99)
NONHDL: 117.9
Triglycerides: 136 mg/dL (ref 0.0–149.0)
VLDL: 27.2 mg/dL (ref 0.0–40.0)

## 2014-02-10 LAB — HEMOGLOBIN A1C: Hgb A1c MFr Bld: 7.2 % — ABNORMAL HIGH (ref 4.6–6.5)

## 2014-02-21 ENCOUNTER — Telehealth: Payer: Self-pay | Admitting: Family Medicine

## 2014-02-21 NOTE — Telephone Encounter (Signed)
Pt would like to know a1c results

## 2014-02-22 NOTE — Telephone Encounter (Signed)
Pt informed

## 2014-02-27 ENCOUNTER — Telehealth: Payer: Self-pay

## 2014-02-27 NOTE — Telephone Encounter (Signed)
Pt dentist doctor called to ask for permission to give the patient antibiotic before his procedure for his teeth. Per Dr. B it is okay patient is not on any blood thinners.

## 2014-03-20 ENCOUNTER — Other Ambulatory Visit: Payer: Self-pay | Admitting: Family Medicine

## 2014-04-03 ENCOUNTER — Other Ambulatory Visit: Payer: Self-pay | Admitting: Family Medicine

## 2014-04-24 ENCOUNTER — Other Ambulatory Visit: Payer: Self-pay | Admitting: Family Medicine

## 2014-05-10 ENCOUNTER — Ambulatory Visit: Payer: BC Managed Care – PPO | Admitting: Family Medicine

## 2014-06-01 ENCOUNTER — Ambulatory Visit: Payer: Self-pay | Admitting: Family Medicine

## 2014-07-06 ENCOUNTER — Ambulatory Visit (INDEPENDENT_AMBULATORY_CARE_PROVIDER_SITE_OTHER): Payer: BLUE CROSS/BLUE SHIELD | Admitting: Family Medicine

## 2014-07-06 ENCOUNTER — Encounter: Payer: Self-pay | Admitting: Family Medicine

## 2014-07-06 VITALS — BP 130/74 | HR 74 | Temp 98.3°F | Wt 268.0 lb

## 2014-07-06 DIAGNOSIS — E119 Type 2 diabetes mellitus without complications: Secondary | ICD-10-CM

## 2014-07-06 DIAGNOSIS — N528 Other male erectile dysfunction: Secondary | ICD-10-CM

## 2014-07-06 DIAGNOSIS — E785 Hyperlipidemia, unspecified: Secondary | ICD-10-CM

## 2014-07-06 DIAGNOSIS — I10 Essential (primary) hypertension: Secondary | ICD-10-CM

## 2014-07-06 LAB — HM DIABETES EYE EXAM

## 2014-07-06 LAB — HM DIABETES FOOT EXAM: HM Diabetic Foot Exam: NORMAL

## 2014-07-06 LAB — HEMOGLOBIN A1C: Hgb A1c MFr Bld: 7.4 % — ABNORMAL HIGH (ref 4.6–6.5)

## 2014-07-06 MED ORDER — TADALAFIL 20 MG PO TABS: ORAL_TABLET | ORAL | Status: DC

## 2014-07-06 NOTE — Patient Instructions (Signed)
Continue with weight loss efforts.  

## 2014-07-06 NOTE — Progress Notes (Signed)
Subjective:    Patient ID: [name], male    DOB: August 30, 1950, 64 y.o.   MRN: [id number]  HPI  Patient seen for medical follow-up  Type 2 diabetes. Improving control. He has lost 12 pounds since last visit and is exercising more consistently. He has been able to reduce his Lantus from 50 units to 40 units twice daily. He's also reducing his short-acting NovoLog insulin. No recent hypoglycemia  Hypertension stable on combination of lisinopril and metoprolol. No dizziness. No headaches. No chest pains.  Hyperlipidemia treated with simvastatin. No myalgias. Lipids were checked last fall and stable.  History of onychomycosis. Nails have improved tremendously with Lamisil. Erectile dysfunction. Requesting prescription for Cialis. He is used this with good success in the past  Past Medical History  Diagnosis Date  . Hyperlipidemia   . Diabetes mellitus   . Gallstone pancreatitis   . Asthma   . Pneumonia     2010  . Hepatitis     pancreatitis 2010  . Peripheral vascular disease     states after arthroscopy left and right was given blood thinners- had symptoms of blood clot but wasnt diagnosed  . Sleep apnea     study years ago/ no cpap/ states mild  . GERD (gastroesophageal reflux disease)   . Hypertension     eccho 6/10, EKG 1/11, clearence Dr Caryl NeverBurchette from 11/28/10 on chart  . Arthritis     oa--and ra.  s/p right knee replacement on Jan 28, 2011--plans left knee replacement on 03/04/11.  also severe lower back pain-previous lumbar fusions, also pain in both hips, shoulders    Past Surgical History  Procedure Laterality Date  . Cholecystectomy    . Colon surgery      removal of part of small intestine 1979  . Total knee arthroplasty  01/28/2011    Procedure: TOTAL KNEE ARTHROPLASTY;  Surgeon: Shelda PalMatthew D Olin;  Location: [facility];  Service: Orthopedics;  Laterality: Right;  femoral nerve block preop.  . Back surgery      hx of multiple back surgeries including lumbar fusion  S1-L3  . Shoulder surgery      bilateral   . Elbow surgery      bilateral  . Tubal ligation    . Total knee arthroplasty  03/04/2011    Procedure: TOTAL KNEE ARTHROPLASTY;  Surgeon: Shelda PalMatthew D Olin;  Location: [facility];  Service: Orthopedics;  Laterality: Left;    reports that he has quit smoking. His smoking use included Cigars. He quit smokeless tobacco use about 23 years ago. He reports that he does not drink alcohol or use illicit drugs. family history is not on file. No Known Allergies   Review of Systems  Constitutional: Negative for fatigue and unexpected weight change.  Eyes: Negative for visual disturbance.  Respiratory: Negative for cough, chest tightness and shortness of breath.   Cardiovascular: Negative for chest pain, palpitations and leg swelling.  Neurological: Negative for dizziness, syncope, weakness, light-headedness and headaches.       Objective:   Physical Exam  Constitutional: He is oriented to person, place, and time. He appears well-developed and well-nourished.  HENT:  Right Ear: External ear normal.  Left Ear: External ear normal.  Mouth/Throat: Oropharynx is clear and moist.  Eyes: Pupils are equal, round, and reactive to light.  Neck: Neck supple. No thyromegaly present.  Cardiovascular: Normal rate and regular rhythm.   Pulmonary/Chest: Effort normal and breath sounds normal. No respiratory distress. He has no wheezes.  He has no rales.  Musculoskeletal: He exhibits no edema.  Neurological: He is alert and oriented to person, place, and time.  Skin:  Feet reveal no skin lesions. Good distal foot pulses. Good capillary refill. No calluses. Normal sensation with monofilament testing           Assessment & Plan:  #1 type 2 diabetes. Improved by home readings. Recheck A1c #2 hypertension stable and at goal #3 hyperlipidemia stable. Recheck lipids at follow-up in 6 months  #4 erectile dysfunction. Prescription for Cialis 20 mg every other day as  needed

## 2014-07-06 NOTE — Progress Notes (Signed)
Pre visit review using our clinic review tool, if applicable. No additional management support is needed unless otherwise documented below in the visit note. 

## 2014-07-27 ENCOUNTER — Telehealth: Payer: Self-pay | Admitting: Family Medicine

## 2014-07-27 MED ORDER — AZELASTINE HCL 0.1 % NA SOLN: 2.0000 | Freq: Two times a day (BID) | NASAL | Status: DC | PRN

## 2014-07-27 NOTE — Telephone Encounter (Signed)
RX sent to pharmacy. Mailed results to home address.

## 2014-07-27 NOTE — Telephone Encounter (Signed)
Requesting re-fill on azelastine (ASTELIN) 137 MCG/SPRAY nasal spray [facility] to [id number] - [place] - [address] [facility] [address]. Patient's wife is also requesting you mail a copy of patient's lab results to their home.

## 2014-12-10 ENCOUNTER — Other Ambulatory Visit: Payer: Self-pay | Admitting: Family Medicine

## 2014-12-12 ENCOUNTER — Telehealth: Payer: Self-pay | Admitting: Family Medicine

## 2014-12-12 NOTE — Telephone Encounter (Signed)
[facility] - Client TELEPHONE ADVICE RECORD [facility]  Patient Name: [name]  DOB: 1950-07-17    Initial Comment caller states her husband has swollen lymph nodes and he is very hoarse c/o sore throat and tongue is black    Nurse Assessment  Nurse: Laural Benes, RN, Dondra Spry Date/Time (Eastern Time): 12/12/2014 11:34:43 AM  Confirm and document reason for call. If symptomatic, describe symptoms. ---[place] has black tongue, sore throat and swollen lymph nodes onset yesterday  Has the patient traveled out of the country within the last 30 days? ---No  Does the patient require triage? ---Yes  Related visit to physician within the last 2 weeks? ---No  Does the PT have any chronic conditions? (i.e. diabetes, asthma, etc.) ---No     Guidelines    Guideline Title Affirmed Question Affirmed Notes  Sore Throat SEVERE (e.g., excruciating) throat pain black tongue lymph nodes swollen   Final Disposition User   See Physician within [address] Hours [place], RNDondra Spry    Comments  [phone] 145pm with Dr Caryl Never   Disagree/Comply: Comply

## 2014-12-13 ENCOUNTER — Ambulatory Visit: Payer: BLUE CROSS/BLUE SHIELD | Admitting: Family Medicine

## 2014-12-30 ENCOUNTER — Other Ambulatory Visit: Payer: Self-pay | Admitting: Family Medicine

## 2015-01-05 ENCOUNTER — Ambulatory Visit: Payer: BLUE CROSS/BLUE SHIELD | Admitting: Family Medicine

## 2015-02-02 ENCOUNTER — Ambulatory Visit (INDEPENDENT_AMBULATORY_CARE_PROVIDER_SITE_OTHER): Payer: BLUE CROSS/BLUE SHIELD | Admitting: Family Medicine

## 2015-02-02 ENCOUNTER — Encounter: Payer: Self-pay | Admitting: Family Medicine

## 2015-02-02 VITALS — BP 140/80 | HR 75 | Temp 98.2°F | Resp 16 | Ht 76.0 in | Wt 275.1 lb

## 2015-02-02 DIAGNOSIS — IMO0002 Reserved for concepts with insufficient information to code with codable children: Secondary | ICD-10-CM

## 2015-02-02 DIAGNOSIS — E1140 Type 2 diabetes mellitus with diabetic neuropathy, unspecified: Secondary | ICD-10-CM | POA: Diagnosis not present

## 2015-02-02 DIAGNOSIS — Z23 Encounter for immunization: Secondary | ICD-10-CM

## 2015-02-02 DIAGNOSIS — E785 Hyperlipidemia, unspecified: Secondary | ICD-10-CM | POA: Diagnosis not present

## 2015-02-02 DIAGNOSIS — E119 Type 2 diabetes mellitus without complications: Secondary | ICD-10-CM | POA: Diagnosis not present

## 2015-02-02 DIAGNOSIS — I10 Essential (primary) hypertension: Secondary | ICD-10-CM

## 2015-02-02 DIAGNOSIS — Z794 Long term (current) use of insulin: Secondary | ICD-10-CM

## 2015-02-02 DIAGNOSIS — E1165 Type 2 diabetes mellitus with hyperglycemia: Secondary | ICD-10-CM | POA: Diagnosis not present

## 2015-02-02 LAB — LIPID PANEL
CHOLESTEROL: 211 mg/dL — AB (ref 0–200)
HDL: 45.5 mg/dL (ref >39.00)
NonHDL: 165.1
Total CHOL/HDL Ratio: 5
Triglycerides: 215 mg/dL — ABNORMAL HIGH (ref 0.0–149.0)
VLDL: 43 mg/dL — ABNORMAL HIGH (ref 0.0–40.0)

## 2015-02-02 LAB — HEPATIC FUNCTION PANEL
ALK PHOS: 70 U/L (ref 39–117)
ALT: 30 U/L (ref 0–53)
AST: 20 U/L (ref 0–37)
Albumin: 4.4 g/dL (ref 3.5–5.2)
Bilirubin, Direct: 0.1 mg/dL (ref 0.0–0.3)
TOTAL PROTEIN: 6.6 g/dL (ref 6.0–8.3)
Total Bilirubin: 0.8 mg/dL (ref 0.2–1.2)

## 2015-02-02 LAB — BASIC METABOLIC PANEL
BUN: 14 mg/dL (ref 6–23)
CO2: 26 mEq/L (ref 19–32)
CREATININE: 1.02 mg/dL (ref 0.40–1.50)
Calcium: 9.3 mg/dL (ref 8.4–10.5)
Chloride: 104 mEq/L (ref 96–112)
GFR: 78.64 mL/min (ref >60.00)
Glucose, Bld: 245 mg/dL — ABNORMAL HIGH (ref 70–99)
POTASSIUM: 4.3 meq/L (ref 3.5–5.1)
Sodium: 139 mEq/L (ref 135–145)

## 2015-02-02 LAB — LDL CHOLESTEROL, DIRECT: Direct LDL: 145 mg/dL

## 2015-02-02 LAB — HEMOGLOBIN A1C: Hgb A1c MFr Bld: 7.3 % — ABNORMAL HIGH (ref 4.6–6.5)

## 2015-02-02 MED ORDER — AMLODIPINE BESYLATE 2.5 MG PO TABS: 2.5000 mg | ORAL_TABLET | Freq: Every day | ORAL | Status: DC

## 2015-02-02 NOTE — Progress Notes (Signed)
Subjective:    Patient ID: [name], male    DOB: [birth year], 64 y.o.   MRN: [id number]  HPI Type 2 diabetes. Last A1c 7.4%. He has been poorly compliant with diet and has recently gained some weight. Blood sugars are still stable fasting. No recent hypoglycemia. He gets regular eye exams.  Hypertension. Remains on lisinopril. Also takes metoprolol. Blood pressures been somewhat up by his home readings recently frequently around 140 systolic  Hyperlipidemia. Patient remains on simvastatin. Due for repeat lipids. He also needs flu vaccine.  Past Medical History  Diagnosis Date  . Hyperlipidemia   . Diabetes mellitus   . Gallstone pancreatitis   . Asthma   . Pneumonia     2010  . Hepatitis     pancreatitis 2010  . Peripheral vascular disease (HCC)     states after arthroscopy left and right was given blood thinners- had symptoms of blood clot but wasnt diagnosed  . Sleep apnea     study years ago/ no cpap/ states mild  . GERD (gastroesophageal reflux disease)   . Hypertension     eccho 6/10, EKG 1/11, clearence Dr Caryl Never from 11/28/10 on chart  . Arthritis     oa--and ra.  s/p right knee replacement on Jan 28, 2011--plans left knee replacement on 03/04/11.  also severe lower back pain-previous lumbar fusions, also pain in both hips, shoulders    Past Surgical History  Procedure Laterality Date  . Cholecystectomy    . Colon surgery      removal of part of small intestine 1979  . Total knee arthroplasty  01/28/2011    Procedure: TOTAL KNEE ARTHROPLASTY;  Surgeon: Shelda Pal;  Location: [facility];  Service: Orthopedics;  Laterality: Right;  femoral nerve block preop.  . Back surgery      hx of multiple back surgeries including lumbar fusion S1-L3  . Shoulder surgery      bilateral   . Elbow surgery      bilateral  . Tubal ligation    . Total knee arthroplasty  03/04/2011    Procedure: TOTAL KNEE ARTHROPLASTY;  Surgeon: Shelda Pal;  Location: [facility];  Service:  Orthopedics;  Laterality: Left;    reports that he has quit smoking. His smoking use included Cigars. He quit smokeless tobacco use about 24 years ago. He reports that he does not drink alcohol or use illicit drugs. family history is not on file. No Known Allergies      Review of Systems  Constitutional: Negative for fatigue.  Eyes: Negative for visual disturbance.  Respiratory: Negative for cough, chest tightness and shortness of breath.   Cardiovascular: Negative for chest pain, palpitations and leg swelling.  Neurological: Negative for dizziness, syncope, weakness, light-headedness and headaches.       Objective:   Physical Exam  Constitutional: He is oriented to person, place, and time. He appears well-developed and well-nourished.  HENT:  Right Ear: External ear normal.  Left Ear: External ear normal.  Mouth/Throat: Oropharynx is clear and moist.  Eyes: Pupils are equal, round, and reactive to light.  Neck: Neck supple. No thyromegaly present.  Cardiovascular: Normal rate and regular rhythm.   Pulmonary/Chest: Effort normal and breath sounds normal. No respiratory distress. He has no wheezes. He has no rales.  Musculoskeletal: He exhibits no edema.  Neurological: He is alert and oriented to person, place, and time.  Skin:  Feet reveal no skin lesions. Good distal foot pulses. Good capillary refill.  No calluses. Normal sensation with monofilament testing           Assessment & Plan:  #1 type 2 diabetes. History of recent suboptimal control. Recheck A1c. Needs to lose some weight. #2 hyperlipidemia. Recheck lipid and hepatic panel #3 hypertension. Suboptimal control. Amlodipine 2.5 mg once daily. Reassess 3 months and titrate amlodipine further that time if not to goal. Hopefully, with some weight loss his blood pressure will improve #4 health maintenance. Flu vaccine given

## 2015-02-02 NOTE — Progress Notes (Signed)
Pre visit review using our clinic review tool, if applicable. No additional management support is needed unless otherwise documented below in the visit note. 

## 2015-02-17 ENCOUNTER — Other Ambulatory Visit: Payer: Self-pay | Admitting: Family Medicine

## 2015-04-10 ENCOUNTER — Other Ambulatory Visit: Payer: Self-pay | Admitting: Family Medicine

## 2015-05-08 ENCOUNTER — Encounter: Payer: Self-pay | Admitting: Family Medicine

## 2015-05-08 ENCOUNTER — Ambulatory Visit (INDEPENDENT_AMBULATORY_CARE_PROVIDER_SITE_OTHER): Payer: BLUE CROSS/BLUE SHIELD | Admitting: Family Medicine

## 2015-05-08 VITALS — BP 110/74 | HR 74 | Temp 97.9°F | Ht 76.0 in | Wt 279.6 lb

## 2015-05-08 DIAGNOSIS — IMO0002 Reserved for concepts with insufficient information to code with codable children: Secondary | ICD-10-CM

## 2015-05-08 DIAGNOSIS — I10 Essential (primary) hypertension: Secondary | ICD-10-CM

## 2015-05-08 DIAGNOSIS — E1140 Type 2 diabetes mellitus with diabetic neuropathy, unspecified: Secondary | ICD-10-CM | POA: Diagnosis not present

## 2015-05-08 DIAGNOSIS — E1165 Type 2 diabetes mellitus with hyperglycemia: Secondary | ICD-10-CM

## 2015-05-08 DIAGNOSIS — Z23 Encounter for immunization: Secondary | ICD-10-CM

## 2015-05-08 DIAGNOSIS — N529 Male erectile dysfunction, unspecified: Secondary | ICD-10-CM

## 2015-05-08 DIAGNOSIS — E785 Hyperlipidemia, unspecified: Secondary | ICD-10-CM

## 2015-05-08 DIAGNOSIS — L918 Other hypertrophic disorders of the skin: Secondary | ICD-10-CM

## 2015-05-08 LAB — POCT GLYCOSYLATED HEMOGLOBIN (HGB A1C): Hemoglobin A1C: 6.9

## 2015-05-08 MED ORDER — INSULIN DETEMIR 100 UNIT/ML ~~LOC~~ SOLN: 40.0000 [IU] | Freq: Two times a day (BID) | SUBCUTANEOUS | Status: DC

## 2015-05-08 NOTE — Progress Notes (Signed)
Subjective:    Patient ID: [name], male    DOB: September 25, 1950, 65 y.o.   MRN: [id number]  HPI Seen for follow-up multiple medical problems  Type 2 diabetes. On insulin. Insurance requiring change from Lantus to another option No recent hypoglycemia. Last A1c 7.3%. Very little exercise. Has gained about 4 pounds since last visit.  Multiple skin tags axillary region bilaterally. He is requesting consideration for treatment. These are frequently irritated because of clothing and sweating.  Hypertension. Very well controlled today. No recent dizziness. Compliant with medications. He has hyperlipidemia and recent poor control of lipids with LDL 140 but he was not taking his simvastatin regularly. He states is that taking that regularly at this point  Erectile dysfunction. Previously used Cialis. Still has some issues with ADD. Requesting samples of Viagra. No nitroglycerin use.  Past Medical History  Diagnosis Date  . Hyperlipidemia   . Diabetes mellitus   . Gallstone pancreatitis   . Asthma   . Pneumonia     2010  . Hepatitis     pancreatitis 2010  . Peripheral vascular disease (HCC)     states after arthroscopy left and right was given blood thinners- had symptoms of blood clot but wasnt diagnosed  . Sleep apnea     study years ago/ no cpap/ states mild  . GERD (gastroesophageal reflux disease)   . Hypertension     eccho 6/10, EKG 1/11, clearence Dr Caryl Never from 11/28/10 on chart  . Arthritis     oa--and ra.  s/p right knee replacement on Jan 28, 2011--plans left knee replacement on 03/04/11.  also severe lower back pain-previous lumbar fusions, also pain in both hips, shoulders    Past Surgical History  Procedure Laterality Date  . Cholecystectomy    . Colon surgery      removal of part of small intestine 1979  . Total knee arthroplasty  01/28/2011    Procedure: TOTAL KNEE ARTHROPLASTY;  Surgeon: Shelda Pal;  Location: [facility];  Service: Orthopedics;   Laterality: Right;  femoral nerve block preop.  . Back surgery      hx of multiple back surgeries including lumbar fusion S1-L3  . Shoulder surgery      bilateral   . Elbow surgery      bilateral  . Tubal ligation    . Total knee arthroplasty  03/04/2011    Procedure: TOTAL KNEE ARTHROPLASTY;  Surgeon: Shelda Pal;  Location: [facility];  Service: Orthopedics;  Laterality: Left;    reports that he has quit smoking. His smoking use included Cigars. He quit smokeless tobacco use about 24 years ago. He reports that he does not drink alcohol or use illicit drugs. family history is not on file. No Known Allergies    Review of Systems  Constitutional: Negative for fatigue.  Eyes: Negative for visual disturbance.  Respiratory: Negative for cough, chest tightness and shortness of breath.   Cardiovascular: Negative for chest pain, palpitations and leg swelling.  Gastrointestinal: Negative for abdominal pain.  Endocrine: Negative for polydipsia and polyuria.  Neurological: Negative for dizziness, syncope, weakness, light-headedness and headaches.       Objective:   Physical Exam  Constitutional: He is oriented to person, place, and time. He appears well-developed and well-nourished. No distress.  Neck: Neck supple. No thyromegaly present.  Cardiovascular: Normal rate and regular rhythm.   Pulmonary/Chest: Effort normal and breath sounds normal. No respiratory distress. He has no wheezes. He has no rales.  Musculoskeletal: He exhibits no edema.  Neurological: He is alert and oriented to person, place, and time. No cranial nerve deficit.  Skin:  Multiple skin tags just inferior to the right and left axillary region          Assessment & Plan:  #1 type 2 diabetes. Repeat A1c 6.9%. Continue current regimen. Switch prescription from Lantus to Levemir for cost issues with insurance. Routine follow-up 3 months No history of Pneumovax and this will be given  #2 hypertension. Stable and  at goal. Continue current medications  #3 hyperlipidemia. History of recent poor control but patient was not compliant. He is now back on simvastatin. Consider repeat lipids at follow-up  #4 skin tags. Patient will check on insurance to see if he has coverage. We explained these could be treated either with liquid nitrogen or surgical excision  #5 erectile dysfunction. Provided samples of Viagra 100 mg one half to one tablet daily as needed

## 2015-05-08 NOTE — Addendum Note (Signed)
Addended by: Tempie Hoist on: 05/08/2015 09:29 AM   Modules accepted: Orders

## 2015-05-08 NOTE — Progress Notes (Signed)
Pre visit review using our clinic review tool, if applicable. No additional management support is needed unless otherwise documented below in the visit note. 

## 2015-06-08 ENCOUNTER — Telehealth: Payer: Self-pay | Admitting: Family Medicine

## 2015-06-08 NOTE — Telephone Encounter (Signed)
Pt said the following med is not working for him insulin detemir (LEVEMIR) 100 UNIT/ML injection. Pt said he contacted his insurance company and in order for him to go back on  LANTUS [employer] insurance company is requiring a letter from Dr Caryl NeverBurchette.  Pt said his numbers has not been good since he has been on the LEVEMIR. Pt is asking for  rx LANTUS to be called in to his pharmacy and his insurance company said they will pay for it.  Pt said 1 bottle is all they will pay for     [facility]  [address]

## 2015-06-10 NOTE — Telephone Encounter (Signed)
Let's send in rx for Lantus (this would be unit per unit switch with Levemir).  Pharmacy can contact if need PA.

## 2015-06-11 MED ORDER — INSULIN GLARGINE 100 UNIT/ML SOLOSTAR PEN: PEN_INJECTOR | SUBCUTANEOUS | Status: DC

## 2015-06-11 NOTE — Telephone Encounter (Signed)
Medication sent into the pharmacy. 

## 2015-06-18 ENCOUNTER — Other Ambulatory Visit: Payer: Self-pay | Admitting: Family Medicine

## 2015-06-29 ENCOUNTER — Other Ambulatory Visit: Payer: Self-pay | Admitting: Family Medicine

## 2015-07-02 ENCOUNTER — Telehealth: Payer: Self-pay | Admitting: General Practice

## 2015-07-02 NOTE — Telephone Encounter (Signed)
PA began with CMM (key # [id number])

## 2015-07-03 NOTE — Telephone Encounter (Signed)
We had already switched him to Levemir but pt stated it did not work as good as Lantus and HE requested going back.  See if he is willing to try [place]iba or [employer]aglar.  They should be as efficacious as Lantus.

## 2015-07-03 NOTE — Telephone Encounter (Signed)
Please advise on one of the two insulins and directions please. Thanks.

## 2015-07-03 NOTE — Telephone Encounter (Signed)
Insurance is recommending that patient be switched to one of the following: Hospital doctorBasaglar, Levemir, Evaristo Buryresiba

## 2015-07-03 NOTE — Telephone Encounter (Signed)
Called patient and left a detailed message to inform about insurance not covering the lantus. I advised patient to contact PCP office to inform if he is willing to try one of the medications listed below. Message routed to PCP CMA.

## 2015-07-03 NOTE — Telephone Encounter (Addendum)
Pt states he has already tried the insulin detemir (LEVEMIR) 100 UNIT/ML injection    and it did not work. Pt's blood sugars would go up and down . Pt is willing to try Guinea-Bissa[place]iba or the [employer]aglar.   Pt is down to 2 vials.  [facility] [address]

## 2015-07-04 MED ORDER — INSULIN DEGLUDEC 100 UNIT/ML ~~LOC~~ SOPN: 3.0000 [IU] | PEN_INJECTOR | Freq: Every day | SUBCUTANEOUS | Status: DC

## 2015-07-04 MED ORDER — INSULIN DEGLUDEC 200 UNIT/ML ~~LOC~~ SOPN: 40.0000 [IU] | PEN_INJECTOR | Freq: Two times a day (BID) | SUBCUTANEOUS | Status: DC

## 2015-07-04 NOTE — Telephone Encounter (Signed)
Spoke with pts wife. RX resent in for pt correctly. He is using 40 units BID. There is a sample in the frig as well for them to pick up. They are agreeable.

## 2015-07-04 NOTE — Telephone Encounter (Signed)
Autumn,  I was confused by instructions of dose of 3 units per day.  He would take same units as he had with his Lantus. (eg if taking 40 units daily would be same for the Guinea-Bissauresiba).  Also, should not use sliding scale for Guinea-Bissauresiba.  Sliding scale for his Novolog.  Let me know if you have any questions.

## 2015-07-04 NOTE — Addendum Note (Signed)
Addended by: Tempie HoistMCNEIL, Johany Hansman M on: 07/04/2015 08:16 AM   Modules accepted: Orders, Medications

## 2015-07-04 NOTE — Telephone Encounter (Signed)
[name] has been sent into pts pharmacy. He is aware via voicemail of this change.

## 2015-07-04 NOTE — Telephone Encounter (Signed)
Switch to Guinea-Bissauresiba and this would be a "unit per unit" switch with his Lantus dose.

## 2015-07-04 NOTE — Addendum Note (Signed)
Addended by: Tempie HoistMCNEIL, AUTUMN M on: 07/04/2015 08:40 AM   Modules accepted: Orders, Medications

## 2015-07-18 ENCOUNTER — Telehealth: Payer: Self-pay | Admitting: Family Medicine

## 2015-07-18 NOTE — Telephone Encounter (Signed)
Pt has question about how to adjust the units of  Insulin Degludec (TRESIBA FLEXTOUCH) 200 UNIT/ML SOPN   Pt does not think he is getting enough in cause his numbers are kind of off.  Please call patient   [phone]

## 2015-07-19 NOTE — Telephone Encounter (Signed)
Left message for pt to call back  °

## 2015-07-23 NOTE — Telephone Encounter (Signed)
Tried calling pt with NA. He has a pending follow up on 08/06/2015.

## 2015-07-30 DIAGNOSIS — Z79891 Long term (current) use of opiate analgesic: Secondary | ICD-10-CM | POA: Diagnosis not present

## 2015-07-30 DIAGNOSIS — G894 Chronic pain syndrome: Secondary | ICD-10-CM | POA: Diagnosis not present

## 2015-07-30 DIAGNOSIS — E1142 Type 2 diabetes mellitus with diabetic polyneuropathy: Secondary | ICD-10-CM | POA: Diagnosis not present

## 2015-07-30 DIAGNOSIS — M961 Postlaminectomy syndrome, not elsewhere classified: Secondary | ICD-10-CM | POA: Diagnosis not present

## 2015-08-06 ENCOUNTER — Ambulatory Visit (INDEPENDENT_AMBULATORY_CARE_PROVIDER_SITE_OTHER): Payer: BLUE CROSS/BLUE SHIELD | Admitting: Family Medicine

## 2015-08-06 ENCOUNTER — Encounter: Payer: Self-pay | Admitting: Family Medicine

## 2015-08-06 VITALS — BP 130/84 | HR 79 | Temp 97.8°F | Ht 76.0 in | Wt 269.0 lb

## 2015-08-06 DIAGNOSIS — E1140 Type 2 diabetes mellitus with diabetic neuropathy, unspecified: Secondary | ICD-10-CM | POA: Diagnosis not present

## 2015-08-06 DIAGNOSIS — I10 Essential (primary) hypertension: Secondary | ICD-10-CM | POA: Diagnosis not present

## 2015-08-06 DIAGNOSIS — IMO0002 Reserved for concepts with insufficient information to code with codable children: Secondary | ICD-10-CM

## 2015-08-06 DIAGNOSIS — E785 Hyperlipidemia, unspecified: Secondary | ICD-10-CM | POA: Diagnosis not present

## 2015-08-06 DIAGNOSIS — E1165 Type 2 diabetes mellitus with hyperglycemia: Secondary | ICD-10-CM

## 2015-08-06 MED ORDER — INSULIN DETEMIR 100 UNIT/ML ~~LOC~~ SOLN: 100.0000 [IU] | Freq: Every day | SUBCUTANEOUS | Status: DC

## 2015-08-06 NOTE — Progress Notes (Signed)
Subjective:    Patient ID: [name], male    DOB: [birth year], 65 y.o.   MRN: [id number]  HPI Follow-up type 2 diabetes Patient was doing extremely well last visit with A1c 6.9% He was taking Lantus along with NovoLog. His insurance required change of insulin and we initially went with Levemir. He states his blood sugars were well-controlled with 50 units of Lantus but required 100 units of Levemir and still had poor control. We then switched to Guinea-Bissa[place]iba and he states he's had consistent rash and pruritus since starting that. He is requesting to go back on Lantus which was working the best for him. He also states that he does not like the quick pen and prefers vial. Brings in blood sugars today and has multiple readings over 200  Hypertension treated with amlodipine, metoprolol, and lisinopril. Blood pressure stable. No dizziness. No chest pains. Blood pressure very well controlled by home readings  Dyslipidemia on simvastatin. He has chronic back pain but no recent myalgias.  Past Medical History  Diagnosis Date  . Hyperlipidemia   . Diabetes mellitus   . Gallstone pancreatitis   . Asthma   . Pneumonia     2010  . Hepatitis     pancreatitis 2010  . Peripheral vascular disease (HCC)     states after arthroscopy left and right was given blood thinners- had symptoms of blood clot but wasnt diagnosed  . Sleep apnea     study years ago/ no cpap/ states mild  . GERD (gastroesophageal reflux disease)   . Hypertension     eccho 6/10, EKG 1/11, clearence Dr Caryl NeverBurchette from 11/28/10 on chart  . Arthritis     oa--and ra.  s/p right knee replacement on Jan 28, 2011--plans left knee replacement on 03/04/11.  also severe lower back pain-previous lumbar fusions, also pain in both hips, shoulders    Past Surgical History  Procedure Laterality Date  . Cholecystectomy    . Colon surgery      removal of part of small intestine 1979  . Total knee arthroplasty  01/28/2011    Procedure:  TOTAL KNEE ARTHROPLASTY;  Surgeon: Shelda PalMatthew D Olin;  Location: [facility];  Service: Orthopedics;  Laterality: Right;  femoral nerve block preop.  . Back surgery      hx of multiple back surgeries including lumbar fusion S1-L3  . Shoulder surgery      bilateral   . Elbow surgery      bilateral  . Tubal ligation    . Total knee arthroplasty  03/04/2011    Procedure: TOTAL KNEE ARTHROPLASTY;  Surgeon: Shelda PalMatthew D Olin;  Location: [facility];  Service: Orthopedics;  Laterality: Left;    reports that he has quit smoking. His smoking use included Cigars. He quit smokeless tobacco use about 24 years ago. He reports that he does not drink alcohol or use illicit drugs. family history is not on file. No Known Allergies    Review of Systems  Constitutional: Negative for fatigue and unexpected weight change.  Eyes: Negative for visual disturbance.  Respiratory: Negative for cough, chest tightness and shortness of breath.   Cardiovascular: Negative for chest pain, palpitations and leg swelling.  Endocrine: Negative for polydipsia and polyuria.  Musculoskeletal: Positive for back pain.  Neurological: Negative for dizziness, syncope, weakness, light-headedness and headaches.       Objective:   Physical Exam  Constitutional: He is oriented to person, place, and time. He appears well-developed and well-nourished.  HENT:  Right Ear: External ear normal.  Left Ear: External ear normal.  Mouth/Throat: Oropharynx is clear and moist.  Eyes: Pupils are equal, round, and reactive to light.  Neck: Neck supple. No thyromegaly present.  Cardiovascular: Normal rate and regular rhythm.   Pulmonary/Chest: Effort normal and breath sounds normal. No respiratory distress. He has no wheezes. He has no rales.  Musculoskeletal: He exhibits no edema.  Neurological: He is alert and oriented to person, place, and time.          Assessment & Plan:  #1 type 2 diabetes. Recent poor control. Patient states he's had much  poorer control after switching from Lantus insulin (to Levemir and then [place]). He is requesting a letter to go back. He's had recent skin rash and itching with [place]-B[place]. We elected not to repeat A1c today since he's had very poor control by home readings and will prefer to go back on his usual insulin regimen if we get this approved by insurance and then reassess in 3 months.  #2 hypertension. Stable and at goal  #3 dyslipidemia.  Most recent lipids reviewed with patient.  Kristian Covey MD [facility]

## 2015-08-06 NOTE — Progress Notes (Signed)
Pre visit review using our clinic review tool, if applicable. No additional management support is needed unless otherwise documented below in the visit note. 

## 2015-08-09 ENCOUNTER — Telehealth: Payer: Self-pay | Admitting: *Deleted

## 2015-08-09 NOTE — Telephone Encounter (Signed)
Patient's wife came into office today checking on status of Prior Auth for Lantus 100 units/ml vials. Prior Auth will be submitted today. In mean time, gave patient's wife sample Lantus pens and reviewed how to use the pen incase vials are not available through the weekend. Did review medication and dosage with Dr. Durene CalHunter prior to giving pens. Told patient we would follow-up with them or would hear from pharmacy once PA was completed.

## 2015-08-13 ENCOUNTER — Other Ambulatory Visit: Payer: Self-pay | Admitting: Family Medicine

## 2015-08-13 MED ORDER — INSULIN DETEMIR 100 UNIT/ML ~~LOC~~ SOLN: 100.0000 [IU] | Freq: Every day | SUBCUTANEOUS | Status: DC

## 2015-08-16 NOTE — Telephone Encounter (Signed)
Prior authorization declined Lantus.    Formulary alternatives are: Hospital doctorBasaglar, Levemir, Evaristo Buryresiba.

## 2015-08-16 NOTE — Telephone Encounter (Signed)
Please see message and advise what insulin you want?

## 2015-08-17 ENCOUNTER — Other Ambulatory Visit: Payer: Self-pay | Admitting: Family Medicine

## 2015-08-17 NOTE — Telephone Encounter (Signed)
Left detailed message on personal voicemail that Rx for Levemir was sent to pharmacy on 5/22 to replace Lantus. Checking to see if you got Rx. Any questions please call office.

## 2015-08-17 NOTE — Telephone Encounter (Signed)
Levemir.  This is a "unit per unit" switch.

## 2015-08-29 ENCOUNTER — Encounter: Payer: Self-pay | Admitting: Family Medicine

## 2015-08-29 ENCOUNTER — Ambulatory Visit (INDEPENDENT_AMBULATORY_CARE_PROVIDER_SITE_OTHER): Payer: BLUE CROSS/BLUE SHIELD | Admitting: Family Medicine

## 2015-08-29 VITALS — BP 130/80 | HR 74 | Temp 97.6°F | Ht 76.0 in | Wt 275.0 lb

## 2015-08-29 DIAGNOSIS — IMO0002 Reserved for concepts with insufficient information to code with codable children: Secondary | ICD-10-CM

## 2015-08-29 DIAGNOSIS — E1165 Type 2 diabetes mellitus with hyperglycemia: Secondary | ICD-10-CM

## 2015-08-29 DIAGNOSIS — E1140 Type 2 diabetes mellitus with diabetic neuropathy, unspecified: Secondary | ICD-10-CM | POA: Diagnosis not present

## 2015-08-29 MED ORDER — BASAGLAR KWIKPEN 100 UNIT/ML ~~LOC~~ SOPN: 100.0000 [IU] | PEN_INJECTOR | Freq: Every day | SUBCUTANEOUS | Status: DC

## 2015-08-29 NOTE — Progress Notes (Signed)
Subjective:    Patient ID: [name], male    DOB: 03/06/1951, 65 y.o.   MRN: [id number]  HPI Patient here to discuss diabetes medications He continues to have difficulties with his insurance company He was very happy with Lantus insulin and they require change. He had diarrhea with [name] and states that Levemir did not work well for him and he had to use way higher dose for similar control. He is now here today requesting trial of Basaglar.  Generally his blood sugars been fairly well controlled until recently. We have not repeated his A1c because of all changes above. No recent hypoglycemia.  Past Medical History  Diagnosis Date  . Hyperlipidemia   . Diabetes mellitus   . Gallstone pancreatitis   . Asthma   . Pneumonia     2010  . Hepatitis     pancreatitis 2010  . Peripheral vascular disease (HCC)     states after arthroscopy left and right was given blood thinners- had symptoms of blood clot but wasnt diagnosed  . Sleep apnea     study years ago/ no cpap/ states mild  . GERD (gastroesophageal reflux disease)   . Hypertension     eccho 6/10, EKG 1/11, clearence Dr Caryl Never from 11/28/10 on chart  . Arthritis     oa--and ra.  s/p right knee replacement on Jan 28, 2011--plans left knee replacement on 03/04/11.  also severe lower back pain-previous lumbar fusions, also pain in both hips, shoulders    Past Surgical History  Procedure Laterality Date  . Cholecystectomy    . Colon surgery      removal of part of small intestine 1979  . Total knee arthroplasty  01/28/2011    Procedure: TOTAL KNEE ARTHROPLASTY;  Surgeon: Shelda Pal;  Location: [facility];  Service: Orthopedics;  Laterality: Right;  femoral nerve block preop.  . Back surgery      hx of multiple back surgeries including lumbar fusion S1-L3  . Shoulder surgery      bilateral   . Elbow surgery      bilateral  . Tubal ligation    . Total knee arthroplasty  03/04/2011    Procedure: TOTAL KNEE  ARTHROPLASTY;  Surgeon: Shelda Pal;  Location: [facility];  Service: Orthopedics;  Laterality: Left;    reports that he has quit smoking. His smoking use included Cigars. He quit smokeless tobacco use about 24 years ago. He reports that he does not drink alcohol or use illicit drugs. family history is not on file. No Known Allergies    Review of Systems  Constitutional: Negative for fatigue and unexpected weight change.  Eyes: Negative for visual disturbance.  Respiratory: Negative for cough, chest tightness and shortness of breath.   Cardiovascular: Negative for chest pain, palpitations and leg swelling.  Endocrine: Negative for polydipsia and polyuria.  Musculoskeletal: Positive for back pain.  Neurological: Negative for dizziness, syncope, weakness, light-headedness and headaches.       Objective:   Physical Exam  Constitutional: He appears well-developed and well-nourished.  Neck: Neck supple. No thyromegaly present.  Cardiovascular: Normal rate and regular rhythm.   Pulmonary/Chest: Effort normal and breath sounds normal. No respiratory distress. He has no wheezes. He has no rales.  Musculoskeletal: He exhibits no edema.          Assessment & Plan:  Type 2 diabetes with neuropathy. We provided samples of Basaglar insulin  we'll plan to wait 2 months before repeating A1c. We  explained this is generally a "unit per unit "switch in insulin from his Lantus  Kristian Covey MD [facility] at [facility]

## 2015-09-05 NOTE — Progress Notes (Signed)
Submitted PA through CoverMyMeds for: Lantus 100 units/ml vial.

## 2015-09-13 ENCOUNTER — Other Ambulatory Visit: Payer: Self-pay | Admitting: *Deleted

## 2015-09-13 ENCOUNTER — Telehealth: Payer: Self-pay | Admitting: Family Medicine

## 2015-09-13 MED ORDER — INSULIN PEN NEEDLE 31G X 5 MM MISC: Status: DC

## 2015-09-13 NOTE — Telephone Encounter (Signed)
Rx sent to pharmacy   

## 2015-09-13 NOTE — Telephone Encounter (Signed)
Pt states his box of Insulin Glargine (BASAGLAR KWIKPEN) 100 UNIT/ML SOPN  did not include the needles.  Pt did not get the "kit". Was only prescribed the pens. Can you sen a [facility] [address]

## 2015-09-21 DIAGNOSIS — M961 Postlaminectomy syndrome, not elsewhere classified: Secondary | ICD-10-CM | POA: Diagnosis not present

## 2015-09-21 DIAGNOSIS — E1142 Type 2 diabetes mellitus with diabetic polyneuropathy: Secondary | ICD-10-CM | POA: Diagnosis not present

## 2015-09-21 DIAGNOSIS — Z79891 Long term (current) use of opiate analgesic: Secondary | ICD-10-CM | POA: Diagnosis not present

## 2015-09-21 DIAGNOSIS — G894 Chronic pain syndrome: Secondary | ICD-10-CM | POA: Diagnosis not present

## 2015-09-29 ENCOUNTER — Other Ambulatory Visit: Payer: Self-pay | Admitting: Family Medicine

## 2015-11-16 DIAGNOSIS — Z79891 Long term (current) use of opiate analgesic: Secondary | ICD-10-CM | POA: Diagnosis not present

## 2015-11-16 DIAGNOSIS — E1142 Type 2 diabetes mellitus with diabetic polyneuropathy: Secondary | ICD-10-CM | POA: Diagnosis not present

## 2015-11-16 DIAGNOSIS — G894 Chronic pain syndrome: Secondary | ICD-10-CM | POA: Diagnosis not present

## 2015-11-16 DIAGNOSIS — M961 Postlaminectomy syndrome, not elsewhere classified: Secondary | ICD-10-CM | POA: Diagnosis not present

## 2015-11-28 ENCOUNTER — Other Ambulatory Visit: Payer: Self-pay | Admitting: Family Medicine

## 2015-11-30 ENCOUNTER — Ambulatory Visit: Payer: BLUE CROSS/BLUE SHIELD | Admitting: Family Medicine

## 2015-12-14 DIAGNOSIS — M961 Postlaminectomy syndrome, not elsewhere classified: Secondary | ICD-10-CM | POA: Diagnosis not present

## 2015-12-14 DIAGNOSIS — Z79891 Long term (current) use of opiate analgesic: Secondary | ICD-10-CM | POA: Diagnosis not present

## 2015-12-14 DIAGNOSIS — G894 Chronic pain syndrome: Secondary | ICD-10-CM | POA: Diagnosis not present

## 2015-12-14 DIAGNOSIS — E1142 Type 2 diabetes mellitus with diabetic polyneuropathy: Secondary | ICD-10-CM | POA: Diagnosis not present

## 2016-01-30 ENCOUNTER — Ambulatory Visit (INDEPENDENT_AMBULATORY_CARE_PROVIDER_SITE_OTHER): Payer: BLUE CROSS/BLUE SHIELD | Admitting: Family Medicine

## 2016-01-30 ENCOUNTER — Encounter: Payer: Self-pay | Admitting: Family Medicine

## 2016-01-30 VITALS — BP 140/90 | HR 78 | Temp 97.6°F | Ht 76.0 in | Wt 285.9 lb

## 2016-01-30 DIAGNOSIS — I10 Essential (primary) hypertension: Secondary | ICD-10-CM | POA: Diagnosis not present

## 2016-01-30 DIAGNOSIS — E1165 Type 2 diabetes mellitus with hyperglycemia: Secondary | ICD-10-CM | POA: Diagnosis not present

## 2016-01-30 DIAGNOSIS — Z23 Encounter for immunization: Secondary | ICD-10-CM | POA: Diagnosis not present

## 2016-01-30 DIAGNOSIS — E785 Hyperlipidemia, unspecified: Secondary | ICD-10-CM

## 2016-01-30 DIAGNOSIS — IMO0002 Reserved for concepts with insufficient information to code with codable children: Secondary | ICD-10-CM

## 2016-01-30 DIAGNOSIS — E1140 Type 2 diabetes mellitus with diabetic neuropathy, unspecified: Secondary | ICD-10-CM

## 2016-01-30 LAB — LIPID PANEL
CHOL/HDL RATIO: 3
Cholesterol: 148 mg/dL (ref 0–200)
HDL: 43.5 mg/dL (ref >39.00)
LDL Cholesterol: 70 mg/dL (ref 0–99)
NONHDL: 104.44
Triglycerides: 173 mg/dL — ABNORMAL HIGH (ref 0.0–149.0)
VLDL: 34.6 mg/dL (ref 0.0–40.0)

## 2016-01-30 LAB — BASIC METABOLIC PANEL
BUN: 18 mg/dL (ref 6–23)
CALCIUM: 9.3 mg/dL (ref 8.4–10.5)
CO2: 25 meq/L (ref 19–32)
Chloride: 105 mEq/L (ref 96–112)
Creatinine, Ser: 1.04 mg/dL (ref 0.40–1.50)
GFR: 76.65 mL/min (ref >60.00)
GLUCOSE: 260 mg/dL — AB (ref 70–99)
POTASSIUM: 4.1 meq/L (ref 3.5–5.1)
SODIUM: 138 meq/L (ref 135–145)

## 2016-01-30 LAB — HEPATIC FUNCTION PANEL
ALBUMIN: 4.3 g/dL (ref 3.5–5.2)
ALT: 30 U/L (ref 0–53)
AST: 21 U/L (ref 0–37)
Alkaline Phosphatase: 77 U/L (ref 39–117)
BILIRUBIN TOTAL: 0.8 mg/dL (ref 0.2–1.2)
Bilirubin, Direct: 0.2 mg/dL (ref 0.0–0.3)
Total Protein: 6.8 g/dL (ref 6.0–8.3)

## 2016-01-30 LAB — HEMOGLOBIN A1C: HEMOGLOBIN A1C: 8 % — AB (ref 4.6–6.5)

## 2016-01-30 MED ORDER — AMLODIPINE BESYLATE 2.5 MG PO TABS: 2.5000 mg | ORAL_TABLET | Freq: Every day | ORAL | 3 refills | Status: DC

## 2016-01-30 NOTE — Progress Notes (Signed)
Subjective:     Patient ID: [name], male   DOB: Aug 04, 1950, 65 y.o.   MRN: [id number]  HPI Patient here for medical follow-up. He has history of type 2 diabetes, dyslipidemia, obesity, hypertension, chronic back pain followed by pain management. He's had some recent disc issues in his neck. Diabetes is been stable with recent fasting blood sugars usually between 95 and 105. No hypoglycemia. Last A1c 6.9% this was several months ago. He is overdue for eye exam with last eye exam 2 years ago.  No neuropathy symptoms currently. He has history of dystrophic nails and has been on Lamisil and would like to consider possible re-treatment.  Still needs flu vaccine. His never had screening colonoscopy but is willing to consider.  Blood pressures been stable by home readings. Remains on metoprolol, amlodipine, and lisinopril. Compliant with therapy.  Past Medical History:  Diagnosis Date  . Arthritis    oa--and ra.  s/p right knee replacement on Jan 28, 2011--plans left knee replacement on 03/04/11.  also severe lower back pain-previous lumbar fusions, also pain in both hips, shoulders   . Asthma   . Diabetes mellitus   . Gallstone pancreatitis   . GERD (gastroesophageal reflux disease)   . Hepatitis    pancreatitis 2010  . Hyperlipidemia   . Hypertension    eccho 6/10, EKG 1/11, clearence Dr Caryl Never from 11/28/10 on chart  . Peripheral vascular disease (HCC)    states after arthroscopy left and right was given blood thinners- had symptoms of blood clot but wasnt diagnosed  . Pneumonia    2010  . Sleep apnea    study years ago/ no cpap/ states mild   Past Surgical History:  Procedure Laterality Date  . BACK SURGERY     hx of multiple back surgeries including lumbar fusion S1-L3  . CHOLECYSTECTOMY    . COLON SURGERY     removal of part of small intestine 1979  . ELBOW SURGERY     bilateral  . SHOULDER SURGERY     bilateral   . TOTAL KNEE ARTHROPLASTY  01/28/2011   Procedure:  TOTAL KNEE ARTHROPLASTY;  Surgeon: Shelda Pal;  Location: [facility];  Service: Orthopedics;  Laterality: Right;  femoral nerve block preop.  Marland Kitchen TOTAL KNEE ARTHROPLASTY  03/04/2011   Procedure: TOTAL KNEE ARTHROPLASTY;  Surgeon: Shelda Pal;  Location: [facility];  Service: Orthopedics;  Laterality: Left;  . TUBAL LIGATION      reports that he has quit smoking. His smoking use included Cigars. He quit smokeless tobacco use about 25 years ago. He reports that he does not drink alcohol or use drugs. family history is not on file. No Known Allergies   Review of Systems  Constitutional: Negative for fatigue.  Eyes: Negative for visual disturbance.  Respiratory: Negative for cough, chest tightness and shortness of breath.   Cardiovascular: Negative for chest pain, palpitations and leg swelling.  Gastrointestinal: Positive for constipation.  Endocrine: Negative for polydipsia and polyuria.  Musculoskeletal: Positive for back pain.  Neurological: Negative for dizziness, syncope, weakness, light-headedness and headaches.       Objective:   Physical Exam  Constitutional: He is oriented to person, place, and time. He appears well-developed and well-nourished.  HENT:  Right Ear: External ear normal.  Left Ear: External ear normal.  Mouth/Throat: Oropharynx is clear and moist.  Eyes: Pupils are equal, round, and reactive to light.  Neck: Neck supple. No thyromegaly present.  Cardiovascular: Normal rate and regular  rhythm.   Pulmonary/Chest: Effort normal and breath sounds normal. No respiratory distress. He has no wheezes. He has no rales.  Musculoskeletal: He exhibits no edema.  Neurological: He is alert and oriented to person, place, and time.  Skin:  Feet reveal no skin lesions. Good distal foot pulses. Good capillary refill. No calluses. Normal sensation with monofilament testing.  He has some thickened dystrophic toenails especially right great toenail        Assessment:     #1 type  2 diabetes. History of good control.   #2 hypertension. Slightly elevated today but patient having current neck pain. He states this has been well controlled by home readings  #3 dyslipidemia  #4 history of onychomycosis    Plan:     -Check labs with basic metabolic panel, lipid panel, hepatic panel, hemoglobin A1c -Flu vaccine given -Refill amlodipine -Monitor blood pressure closely and be in touch if consistently greater than 140/90 -Needs screening colonoscopy and he will call back with preference for group -Routine follow-up 3 months and sooner as needed -Consider repeat treatment with Lamisil 250 mg daily for 3 months if hepatic function normal  Kristian Covey MD [facility] at [facility]

## 2016-01-30 NOTE — Patient Instructions (Signed)
Monitor blood pressure and be in touch if consistently > 140/90.   

## 2016-01-30 NOTE — Progress Notes (Signed)
Pre visit review using our clinic review tool, if applicable. No additional management support is needed unless otherwise documented below in the visit note. 

## 2016-02-08 DIAGNOSIS — Z79891 Long term (current) use of opiate analgesic: Secondary | ICD-10-CM | POA: Diagnosis not present

## 2016-02-08 DIAGNOSIS — E1142 Type 2 diabetes mellitus with diabetic polyneuropathy: Secondary | ICD-10-CM | POA: Diagnosis not present

## 2016-02-08 DIAGNOSIS — G894 Chronic pain syndrome: Secondary | ICD-10-CM | POA: Diagnosis not present

## 2016-02-08 DIAGNOSIS — M961 Postlaminectomy syndrome, not elsewhere classified: Secondary | ICD-10-CM | POA: Diagnosis not present

## 2016-03-07 ENCOUNTER — Telehealth: Payer: Self-pay | Admitting: Family Medicine

## 2016-03-07 NOTE — Telephone Encounter (Signed)
Pts wife would like to know if there is another insulin that is less expensive other than NOVOLOG and would like to know if you would put him back on lamisil for his nail fungus.

## 2016-03-09 NOTE — Telephone Encounter (Signed)
-  He had normal hepatic panel recently and we can start Lamisil 250 mg po once daily for 3 months -they need to check with insurance.and see if Humalog would be any less expensive than Novolog.

## 2016-03-10 MED ORDER — TERBINAFINE HCL 250 MG PO TABS: 250.0000 mg | ORAL_TABLET | Freq: Every day | ORAL | 0 refills | Status: DC

## 2016-03-10 NOTE — Telephone Encounter (Signed)
Pt is aware of annotations below via voicemail. Medication sent into pharmacy for him to start.

## 2016-03-23 ENCOUNTER — Other Ambulatory Visit: Payer: Self-pay | Admitting: Family Medicine

## 2016-04-04 DIAGNOSIS — Z79891 Long term (current) use of opiate analgesic: Secondary | ICD-10-CM | POA: Diagnosis not present

## 2016-04-04 DIAGNOSIS — E1142 Type 2 diabetes mellitus with diabetic polyneuropathy: Secondary | ICD-10-CM | POA: Diagnosis not present

## 2016-04-04 DIAGNOSIS — M961 Postlaminectomy syndrome, not elsewhere classified: Secondary | ICD-10-CM | POA: Diagnosis not present

## 2016-04-04 DIAGNOSIS — G894 Chronic pain syndrome: Secondary | ICD-10-CM | POA: Diagnosis not present

## 2016-04-09 ENCOUNTER — Other Ambulatory Visit: Payer: Self-pay | Admitting: Family Medicine

## 2016-04-18 ENCOUNTER — Other Ambulatory Visit: Payer: Self-pay | Admitting: Family Medicine

## 2016-04-23 LAB — HM DIABETES EYE EXAM

## 2016-04-30 ENCOUNTER — Ambulatory Visit: Payer: BLUE CROSS/BLUE SHIELD | Admitting: Family Medicine

## 2016-04-30 ENCOUNTER — Encounter: Payer: Self-pay | Admitting: Family Medicine

## 2016-05-02 DIAGNOSIS — E1142 Type 2 diabetes mellitus with diabetic polyneuropathy: Secondary | ICD-10-CM | POA: Diagnosis not present

## 2016-05-02 DIAGNOSIS — Z79891 Long term (current) use of opiate analgesic: Secondary | ICD-10-CM | POA: Diagnosis not present

## 2016-05-02 DIAGNOSIS — G894 Chronic pain syndrome: Secondary | ICD-10-CM | POA: Diagnosis not present

## 2016-05-02 DIAGNOSIS — M961 Postlaminectomy syndrome, not elsewhere classified: Secondary | ICD-10-CM | POA: Diagnosis not present

## 2016-05-28 ENCOUNTER — Ambulatory Visit: Payer: BLUE CROSS/BLUE SHIELD | Admitting: Family Medicine

## 2016-06-13 DIAGNOSIS — M961 Postlaminectomy syndrome, not elsewhere classified: Secondary | ICD-10-CM | POA: Diagnosis not present

## 2016-06-13 DIAGNOSIS — E1142 Type 2 diabetes mellitus with diabetic polyneuropathy: Secondary | ICD-10-CM | POA: Diagnosis not present

## 2016-06-13 DIAGNOSIS — Z79891 Long term (current) use of opiate analgesic: Secondary | ICD-10-CM | POA: Diagnosis not present

## 2016-06-13 DIAGNOSIS — G894 Chronic pain syndrome: Secondary | ICD-10-CM | POA: Diagnosis not present

## 2016-06-25 ENCOUNTER — Ambulatory Visit (INDEPENDENT_AMBULATORY_CARE_PROVIDER_SITE_OTHER): Payer: BLUE CROSS/BLUE SHIELD | Admitting: Family Medicine

## 2016-06-25 ENCOUNTER — Encounter: Payer: Self-pay | Admitting: Family Medicine

## 2016-06-25 VITALS — BP 140/80 | HR 74 | Temp 97.9°F

## 2016-06-25 DIAGNOSIS — I10 Essential (primary) hypertension: Secondary | ICD-10-CM | POA: Diagnosis not present

## 2016-06-25 DIAGNOSIS — E1165 Type 2 diabetes mellitus with hyperglycemia: Secondary | ICD-10-CM | POA: Diagnosis not present

## 2016-06-25 DIAGNOSIS — E1140 Type 2 diabetes mellitus with diabetic neuropathy, unspecified: Secondary | ICD-10-CM

## 2016-06-25 DIAGNOSIS — E669 Obesity, unspecified: Secondary | ICD-10-CM

## 2016-06-25 DIAGNOSIS — IMO0002 Reserved for concepts with insufficient information to code with codable children: Secondary | ICD-10-CM

## 2016-06-25 LAB — POCT GLYCOSYLATED HEMOGLOBIN (HGB A1C): HEMOGLOBIN A1C: 7.2

## 2016-06-25 NOTE — Progress Notes (Signed)
Subjective:     Patient ID: [name], male   DOB: [birth year], 66 y.o.   MRN: [id number]  HPI  Here for medical follow-up. He has type 2 diabetes, obesity, osteoarthritis, hyperlipidemia, remote history of pancreatitis, hypertension, chronic back pain followed by pain management. Not monitoring blood sugars regularly. Last A1c was 8.0%. No recent hypoglycemia. Medications reviewed and compliant with all.  Blood pressures have been very stable by home readings with consistent readings 120s to 130s systolic. No recent chest pains. Activities have been limited by his back pain recently. Recent eye exam unremarkable for any retinopathy  Past Medical History:  Diagnosis Date  . Arthritis    oa--and ra.  s/p right knee replacement on Jan 28, 2011--plans left knee replacement on 03/04/11.  also severe lower back pain-previous lumbar fusions, also pain in both hips, shoulders   . Asthma   . Diabetes mellitus   . Gallstone pancreatitis   . GERD (gastroesophageal reflux disease)   . Hepatitis    pancreatitis 2010  . Hyperlipidemia   . Hypertension    eccho 6/10, EKG 1/11, clearence Dr Caryl Never from 11/28/10 on chart  . Peripheral vascular disease (HCC)    states after arthroscopy left and right was given blood thinners- had symptoms of blood clot but wasnt diagnosed  . Pneumonia    2010  . Sleep apnea    study years ago/ no cpap/ states mild   Past Surgical History:  Procedure Laterality Date  . BACK SURGERY     hx of multiple back surgeries including lumbar fusion S1-L3  . CHOLECYSTECTOMY    . COLON SURGERY     removal of part of small intestine 1979  . ELBOW SURGERY     bilateral  . SHOULDER SURGERY     bilateral   . TOTAL KNEE ARTHROPLASTY  01/28/2011   Procedure: TOTAL KNEE ARTHROPLASTY;  Surgeon: Shelda Pal;  Location: [facility];  Service: Orthopedics;  Laterality: Right;  femoral nerve block preop.  Marland Kitchen TOTAL KNEE ARTHROPLASTY  03/04/2011   Procedure: TOTAL KNEE  ARTHROPLASTY;  Surgeon: Shelda Pal;  Location: [facility];  Service: Orthopedics;  Laterality: Left;  . TUBAL LIGATION      reports that he has quit smoking. His smoking use included Cigars. He quit smokeless tobacco use about 25 years ago. He reports that he does not drink alcohol or use drugs. family history is not on file. No Known Allergies  Review of Systems  Constitutional: Negative for fatigue.  Eyes: Negative for visual disturbance.  Respiratory: Negative for cough, chest tightness and shortness of breath.   Cardiovascular: Negative for chest pain, palpitations and leg swelling.  Neurological: Negative for dizziness, syncope, weakness, light-headedness and headaches.       Objective:   Physical Exam  Constitutional: He is oriented to person, place, and time. He appears well-developed and well-nourished.  HENT:  Right Ear: External ear normal.  Left Ear: External ear normal.  Mouth/Throat: Oropharynx is clear and moist.  Eyes: Pupils are equal, round, and reactive to light.  Neck: Neck supple. No thyromegaly present.  Cardiovascular: Normal rate and regular rhythm.   Pulmonary/Chest: Effort normal and breath sounds normal. No respiratory distress. He has no wheezes. He has no rales.  Musculoskeletal: He exhibits no edema.  Neurological: He is alert and oriented to person, place, and time.  Skin:  Feet reveal no skin lesions. Good distal foot pulses. Good capillary refill. No calluses. Normal sensation with monofilament testing  Assessment:     #1 type 2 diabetes. Improved control A1c today 7.2%  #2 hypertension. Borderline elevation here but consistently well controlled at home\  #3 obesity.    Plan:     -continue with current medication regimen. Continue to monitor blood pressure closely at home and be in touch if consistently greater than 140/90 -Routine follow-up in 3 months and sooner as needed -he is encouraged to lose some weight.  Kristian Covey  MD [facility] at [facility]

## 2016-06-25 NOTE — Progress Notes (Signed)
Pre visit review using our clinic review tool, if applicable. No additional management support is needed unless otherwise documented below in the visit note. 

## 2016-06-25 NOTE — Patient Instructions (Signed)
Monitor blood pressure and be in touch if consistently > 140/90.   Your A1C is improved at 7.2%.   Hope your back is feeling better soon.

## 2016-07-21 ENCOUNTER — Telehealth: Payer: Self-pay | Admitting: Family Medicine

## 2016-07-21 NOTE — Telephone Encounter (Signed)
LMTCB to see if pt would like sooner appt

## 2016-07-21 NOTE — Telephone Encounter (Signed)
[facility] - Client TELEPHONE ADVICE RECORD   [facility]     Patient Name: [facility] Initial Comment Caller states husband has high bp, asking if meds need to be increased. His bp has been running 150-170 on top  DOB: [birth year]      Nurse Assessment  Nurse: Odis Luster, RN, Bjorn Loser Date/Time Lamount Cohen Time): 07/21/2016 1:35:26 PM  Confirm and document reason for call. If symptomatic, describe symptoms. ---Caller states husband has high bp, asking if meds need to be increased. His bp has been running 150-170 on top. Reports that the patient is feeling ok, but worried about the BP. Denies dizziness/SOB. Reports recent dosage change with BP meds, keeps going up.  Does the patient have any new or worsening symptoms? ---Yes  Will a triage be completed? ---Yes  Related visit to physician within the last 2 weeks? ---Yes  Does the PT have any chronic conditions? (i.e. diabetes, asthma, etc.) ---Yes  List chronic conditions. ---HTN; diabetic;  Is this a behavioral health or substance abuse call? ---No    Guidelines     Guideline Title Affirmed Question Affirmed Notes   High Blood Pressure Systolic BP >= 160 OR Diastolic >= 100    Final Disposition User   See PCP When Office is Open (within 3 days) Odis Luster, RN, Bjorn Loser   Comments   Appt scheduled for Friday at 10:30am at the [facility] location with Dr. Caryl Never. Caller voiced understanding.   Referrals   REFERRED TO PCP OFFICE   Disagree/Comply: Comply

## 2016-07-21 NOTE — Telephone Encounter (Signed)
[name] spoke with pt's wife and she states that they can only come in on Friday. Nothing further needed at this time.

## 2016-07-22 ENCOUNTER — Telehealth: Payer: Self-pay | Admitting: Family Medicine

## 2016-07-22 NOTE — Telephone Encounter (Signed)
Increase Amlodipine to 5 mg daily

## 2016-07-22 NOTE — Telephone Encounter (Signed)
° °  Wife call and ask if the below med can be increased from 2.5.  Would like a call back     amLODipine (NORVASC) 2.5 MG tablet

## 2016-07-22 NOTE — Telephone Encounter (Signed)
Spoke with patient and states that last office visit that his blood pressure had increased due to taking ibuprofen due to pain.   It is time for a refill and he states his blood pressure has been ranging 140/70 daily.  He would like to know if he should increase his medication or keep the dosage the same?  [facility] [address]

## 2016-07-23 NOTE — Telephone Encounter (Signed)
[name] pt returning your call °

## 2016-07-23 NOTE — Telephone Encounter (Signed)
Left message on machine for patient per DPR.  Med list updated.

## 2016-07-25 ENCOUNTER — Ambulatory Visit: Payer: Self-pay | Admitting: Family Medicine

## 2016-07-25 ENCOUNTER — Other Ambulatory Visit: Payer: Self-pay | Admitting: *Deleted

## 2016-07-25 DIAGNOSIS — M961 Postlaminectomy syndrome, not elsewhere classified: Secondary | ICD-10-CM | POA: Diagnosis not present

## 2016-07-25 DIAGNOSIS — E1142 Type 2 diabetes mellitus with diabetic polyneuropathy: Secondary | ICD-10-CM | POA: Diagnosis not present

## 2016-07-25 DIAGNOSIS — G894 Chronic pain syndrome: Secondary | ICD-10-CM | POA: Diagnosis not present

## 2016-07-25 DIAGNOSIS — Z79891 Long term (current) use of opiate analgesic: Secondary | ICD-10-CM | POA: Diagnosis not present

## 2016-07-25 MED ORDER — AMLODIPINE BESYLATE 5 MG PO TABS: 5.0000 mg | ORAL_TABLET | Freq: Every day | ORAL | 1 refills | Status: DC

## 2016-08-22 DIAGNOSIS — M961 Postlaminectomy syndrome, not elsewhere classified: Secondary | ICD-10-CM | POA: Diagnosis not present

## 2016-08-22 DIAGNOSIS — E1142 Type 2 diabetes mellitus with diabetic polyneuropathy: Secondary | ICD-10-CM | POA: Diagnosis not present

## 2016-08-22 DIAGNOSIS — G894 Chronic pain syndrome: Secondary | ICD-10-CM | POA: Diagnosis not present

## 2016-08-22 DIAGNOSIS — Z79891 Long term (current) use of opiate analgesic: Secondary | ICD-10-CM | POA: Diagnosis not present

## 2016-08-29 ENCOUNTER — Other Ambulatory Visit: Payer: Self-pay | Admitting: Family Medicine

## 2016-09-04 ENCOUNTER — Other Ambulatory Visit: Payer: Self-pay | Admitting: Family Medicine

## 2016-09-22 ENCOUNTER — Other Ambulatory Visit: Payer: Self-pay | Admitting: Family Medicine

## 2016-09-26 ENCOUNTER — Ambulatory Visit: Payer: BLUE CROSS/BLUE SHIELD | Admitting: Family Medicine

## 2016-10-08 ENCOUNTER — Other Ambulatory Visit: Payer: Self-pay | Admitting: Family Medicine

## 2016-11-10 ENCOUNTER — Other Ambulatory Visit: Payer: Self-pay | Admitting: Family Medicine

## 2016-11-28 DIAGNOSIS — M961 Postlaminectomy syndrome, not elsewhere classified: Secondary | ICD-10-CM | POA: Diagnosis not present

## 2016-11-28 DIAGNOSIS — Z79891 Long term (current) use of opiate analgesic: Secondary | ICD-10-CM | POA: Diagnosis not present

## 2016-11-28 DIAGNOSIS — G894 Chronic pain syndrome: Secondary | ICD-10-CM | POA: Diagnosis not present

## 2016-11-28 DIAGNOSIS — E1142 Type 2 diabetes mellitus with diabetic polyneuropathy: Secondary | ICD-10-CM | POA: Diagnosis not present

## 2016-12-31 ENCOUNTER — Encounter: Payer: Self-pay | Admitting: Family Medicine

## 2016-12-31 ENCOUNTER — Ambulatory Visit (INDEPENDENT_AMBULATORY_CARE_PROVIDER_SITE_OTHER): Payer: BLUE CROSS/BLUE SHIELD | Admitting: Family Medicine

## 2016-12-31 VITALS — BP 142/84 | HR 71 | Temp 97.5°F | Resp 16 | Wt 287.0 lb

## 2016-12-31 DIAGNOSIS — R748 Abnormal levels of other serum enzymes: Secondary | ICD-10-CM

## 2016-12-31 DIAGNOSIS — Z23 Encounter for immunization: Secondary | ICD-10-CM

## 2016-12-31 DIAGNOSIS — E1165 Type 2 diabetes mellitus with hyperglycemia: Secondary | ICD-10-CM | POA: Diagnosis not present

## 2016-12-31 DIAGNOSIS — E785 Hyperlipidemia, unspecified: Secondary | ICD-10-CM

## 2016-12-31 DIAGNOSIS — IMO0002 Reserved for concepts with insufficient information to code with codable children: Secondary | ICD-10-CM

## 2016-12-31 DIAGNOSIS — E1140 Type 2 diabetes mellitus with diabetic neuropathy, unspecified: Secondary | ICD-10-CM | POA: Diagnosis not present

## 2016-12-31 DIAGNOSIS — Z1159 Encounter for screening for other viral diseases: Secondary | ICD-10-CM | POA: Diagnosis not present

## 2016-12-31 DIAGNOSIS — I10 Essential (primary) hypertension: Secondary | ICD-10-CM

## 2016-12-31 LAB — BASIC METABOLIC PANEL
BUN: 18 mg/dL (ref 6–23)
CHLORIDE: 102 meq/L (ref 96–112)
CO2: 30 mEq/L (ref 19–32)
Calcium: 9.1 mg/dL (ref 8.4–10.5)
Creatinine, Ser: 1.07 mg/dL (ref 0.40–1.50)
GFR: 73.96 mL/min (ref >60.00)
GLUCOSE: 234 mg/dL — AB (ref 70–99)
POTASSIUM: 4.5 meq/L (ref 3.5–5.1)
Sodium: 140 mEq/L (ref 135–145)

## 2016-12-31 LAB — LIPID PANEL
CHOL/HDL RATIO: 3
CHOLESTEROL: 144 mg/dL (ref 0–200)
HDL: 46.6 mg/dL (ref >39.00)
NonHDL: 97.01
Triglycerides: 220 mg/dL — ABNORMAL HIGH (ref 0.0–149.0)
VLDL: 44 mg/dL — AB (ref 0.0–40.0)

## 2016-12-31 LAB — HEPATIC FUNCTION PANEL
ALT: 107 U/L — ABNORMAL HIGH (ref 0–53)
AST: 35 U/L (ref 0–37)
Albumin: 4.3 g/dL (ref 3.5–5.2)
Alkaline Phosphatase: 125 U/L — ABNORMAL HIGH (ref 39–117)
BILIRUBIN TOTAL: 0.7 mg/dL (ref 0.2–1.2)
Bilirubin, Direct: 0.1 mg/dL (ref 0.0–0.3)
Total Protein: 6.5 g/dL (ref 6.0–8.3)

## 2016-12-31 LAB — HEMOGLOBIN A1C: HEMOGLOBIN A1C: 7.9 % — AB (ref 4.6–6.5)

## 2016-12-31 LAB — LDL CHOLESTEROL, DIRECT: LDL DIRECT: 76 mg/dL

## 2016-12-31 NOTE — Progress Notes (Signed)
Subjective:     Patient ID: [name], male   DOB: 09-26-1950, 66 y.o.   MRN: [id number]  HPI Patient seen for medical follow-up. He has chronic back pain followed by pain management. They are in process of trying to taper his pain medications down. He had increased pain which he attributed to his elevated blood pressure . He has type 2 diabetes. Last A1c 7.2%. Fasting blood sugars fairly consistently around 100. No recent hypoglycemia. Currently takes Basaglar insulin 60 units once daily and sliding scale NovoLog 3 times daily with meals.  Needs flu vaccine. He is also due for repeat colonoscopy but he defers at this time. No history of hepatitis C screening to our knowledge.  Medications reviewed. He's not had lipids done in a year. Takes simvastatin. No significant myalgias.  Past Medical History:  Diagnosis Date  . Arthritis    oa--and ra.  s/p right knee replacement on Jan 28, 2011--plans left knee replacement on 03/04/11.  also severe lower back pain-previous lumbar fusions, also pain in both hips, shoulders   . Asthma   . Diabetes mellitus   . Gallstone pancreatitis   . GERD (gastroesophageal reflux disease)   . Hepatitis    pancreatitis 2010  . Hyperlipidemia   . Hypertension    eccho 6/10, EKG 1/11, clearence Dr Caryl Never from 11/28/10 on chart  . Peripheral vascular disease (HCC)    states after arthroscopy left and right was given blood thinners- had symptoms of blood clot but wasnt diagnosed  . Pneumonia    2010  . Sleep apnea    study years ago/ no cpap/ states mild   Past Surgical History:  Procedure Laterality Date  . BACK SURGERY     hx of multiple back surgeries including lumbar fusion S1-L3  . CHOLECYSTECTOMY    . COLON SURGERY     removal of part of small intestine 1979  . ELBOW SURGERY     bilateral  . SHOULDER SURGERY     bilateral   . TOTAL KNEE ARTHROPLASTY  01/28/2011   Procedure: TOTAL KNEE ARTHROPLASTY;  Surgeon: Shelda Pal;  Location: [facility];  Service: Orthopedics;  Laterality: Right;  femoral nerve block preop.  Marland Kitchen TOTAL KNEE ARTHROPLASTY  03/04/2011   Procedure: TOTAL KNEE ARTHROPLASTY;  Surgeon: Shelda Pal;  Location: [facility];  Service: Orthopedics;  Laterality: Left;  . TUBAL LIGATION      reports that he has quit smoking. His smoking use included Cigars. He quit smokeless tobacco use about 25 years ago. He reports that he does not drink alcohol or use drugs. family history is not on file. No Known Allergies   Review of Systems  Constitutional: Positive for fatigue. Negative for activity change, appetite change and fever.  HENT: Negative for congestion, ear pain and trouble swallowing.   Eyes: Negative for pain and visual disturbance.  Respiratory: Negative for cough, shortness of breath and wheezing.   Cardiovascular: Negative for chest pain and palpitations.  Gastrointestinal: Negative for abdominal distention, abdominal pain, blood in stool, constipation, diarrhea, nausea, rectal pain and vomiting.  Genitourinary: Negative for dysuria, hematuria and testicular pain.  Musculoskeletal: Positive for arthralgias and back pain. Negative for joint swelling.  Skin: Negative for rash.  Neurological: Negative for dizziness, syncope and headaches.  Hematological: Negative for adenopathy.  Psychiatric/Behavioral: Negative for confusion and dysphoric mood.       Objective:   Physical Exam  Constitutional: He is oriented to person, place, and time. He  appears well-developed and well-nourished.  HENT:  Right Ear: External ear normal.  Left Ear: External ear normal.  Mouth/Throat: Oropharynx is clear and moist.  Eyes: Pupils are equal, round, and reactive to light.  Neck: Neck supple. No thyromegaly present.  Cardiovascular: Normal rate and regular rhythm.   Pulmonary/Chest: Effort normal and breath sounds normal. No respiratory distress. He has no wheezes. He has no rales.  Musculoskeletal: He exhibits no edema.   Neurological: He is alert and oriented to person, place, and time.  Skin:  Feet-thickened dystrophic nails consistent with probable onychomycosis. No foot lesions.No callus. Impairment with monofilament testing bilateral        Assessment:     #1 type 2 diabetes. Recent weight gain may be exacerbating.  #2 hypertension slightly elevated today but patient states his pain has been increased recently with reduction pain medication. Improved control at home  #3 dyslipidemia    Plan:     -Check further labs with lipid panel, hepatic panel, basic metabolic panel, hemoglobin A1c, hepatitis C antibody -Flu vaccine given -Encouraged to lose some weight -Discussed colon cancer screening. He defers at this time -We recommended setting up physical at some point this year  Kristian Covey MD [facility] at [facility]

## 2017-01-01 LAB — HEPATITIS C ANTIBODY
Hepatitis C Ab: NONREACTIVE
SIGNAL TO CUT-OFF: 0.01 (ref <1.00)

## 2017-01-01 NOTE — Addendum Note (Signed)
Addended by: Johnella Moloney on: 01/01/2017 02:06 PM   Modules accepted: Orders

## 2017-01-23 ENCOUNTER — Other Ambulatory Visit: Payer: Self-pay | Admitting: Family Medicine

## 2017-01-30 DIAGNOSIS — G894 Chronic pain syndrome: Secondary | ICD-10-CM | POA: Diagnosis not present

## 2017-01-30 DIAGNOSIS — M961 Postlaminectomy syndrome, not elsewhere classified: Secondary | ICD-10-CM | POA: Diagnosis not present

## 2017-01-30 DIAGNOSIS — E1142 Type 2 diabetes mellitus with diabetic polyneuropathy: Secondary | ICD-10-CM | POA: Diagnosis not present

## 2017-01-30 DIAGNOSIS — Z79891 Long term (current) use of opiate analgesic: Secondary | ICD-10-CM | POA: Diagnosis not present

## 2017-02-11 ENCOUNTER — Other Ambulatory Visit: Payer: Self-pay | Admitting: Family Medicine

## 2017-03-19 ENCOUNTER — Other Ambulatory Visit: Payer: Self-pay | Admitting: Family Medicine

## 2017-03-27 DIAGNOSIS — G894 Chronic pain syndrome: Secondary | ICD-10-CM | POA: Diagnosis not present

## 2017-03-27 DIAGNOSIS — M961 Postlaminectomy syndrome, not elsewhere classified: Secondary | ICD-10-CM | POA: Diagnosis not present

## 2017-03-27 DIAGNOSIS — E1142 Type 2 diabetes mellitus with diabetic polyneuropathy: Secondary | ICD-10-CM | POA: Diagnosis not present

## 2017-03-27 DIAGNOSIS — Z79891 Long term (current) use of opiate analgesic: Secondary | ICD-10-CM | POA: Diagnosis not present

## 2017-04-03 ENCOUNTER — Other Ambulatory Visit: Payer: Self-pay | Admitting: Family Medicine

## 2017-05-12 ENCOUNTER — Other Ambulatory Visit: Payer: Self-pay | Admitting: *Deleted

## 2017-05-12 MED ORDER — BASAGLAR KWIKPEN 100 UNIT/ML ~~LOC~~ SOPN: 100.0000 [IU] | PEN_INJECTOR | Freq: Every day | SUBCUTANEOUS | 4 refills | Status: DC

## 2017-05-15 ENCOUNTER — Other Ambulatory Visit: Payer: Self-pay | Admitting: *Deleted

## 2017-05-15 MED ORDER — BASAGLAR KWIKPEN 100 UNIT/ML ~~LOC~~ SOPN: 100.0000 [IU] | PEN_INJECTOR | Freq: Every day | SUBCUTANEOUS | 4 refills | Status: DC

## 2017-05-19 ENCOUNTER — Other Ambulatory Visit: Payer: Self-pay | Admitting: Family Medicine

## 2017-05-22 DIAGNOSIS — E1142 Type 2 diabetes mellitus with diabetic polyneuropathy: Secondary | ICD-10-CM | POA: Diagnosis not present

## 2017-05-22 DIAGNOSIS — M961 Postlaminectomy syndrome, not elsewhere classified: Secondary | ICD-10-CM | POA: Diagnosis not present

## 2017-05-22 DIAGNOSIS — G894 Chronic pain syndrome: Secondary | ICD-10-CM | POA: Diagnosis not present

## 2017-05-22 DIAGNOSIS — Z79891 Long term (current) use of opiate analgesic: Secondary | ICD-10-CM | POA: Diagnosis not present

## 2017-06-15 DIAGNOSIS — Z79891 Long term (current) use of opiate analgesic: Secondary | ICD-10-CM | POA: Diagnosis not present

## 2017-07-01 ENCOUNTER — Ambulatory Visit: Payer: BLUE CROSS/BLUE SHIELD | Admitting: Family Medicine

## 2017-07-03 DIAGNOSIS — Z79891 Long term (current) use of opiate analgesic: Secondary | ICD-10-CM | POA: Diagnosis not present

## 2017-07-03 DIAGNOSIS — E1142 Type 2 diabetes mellitus with diabetic polyneuropathy: Secondary | ICD-10-CM | POA: Diagnosis not present

## 2017-07-03 DIAGNOSIS — G894 Chronic pain syndrome: Secondary | ICD-10-CM | POA: Diagnosis not present

## 2017-07-03 DIAGNOSIS — M961 Postlaminectomy syndrome, not elsewhere classified: Secondary | ICD-10-CM | POA: Diagnosis not present

## 2017-07-11 ENCOUNTER — Other Ambulatory Visit: Payer: Self-pay | Admitting: Family Medicine

## 2017-07-19 ENCOUNTER — Other Ambulatory Visit: Payer: Self-pay | Admitting: Family Medicine

## 2017-07-21 ENCOUNTER — Encounter: Payer: Self-pay | Admitting: Family Medicine

## 2017-07-21 ENCOUNTER — Ambulatory Visit: Payer: BLUE CROSS/BLUE SHIELD | Admitting: Family Medicine

## 2017-07-21 VITALS — BP 142/78 | HR 77 | Temp 97.6°F | Wt 284.8 lb

## 2017-07-21 DIAGNOSIS — IMO0002 Reserved for concepts with insufficient information to code with codable children: Secondary | ICD-10-CM

## 2017-07-21 DIAGNOSIS — R740 Nonspecific elevation of levels of transaminase and lactic acid dehydrogenase [LDH]: Secondary | ICD-10-CM | POA: Diagnosis not present

## 2017-07-21 DIAGNOSIS — M25521 Pain in right elbow: Secondary | ICD-10-CM | POA: Diagnosis not present

## 2017-07-21 DIAGNOSIS — E1165 Type 2 diabetes mellitus with hyperglycemia: Secondary | ICD-10-CM

## 2017-07-21 DIAGNOSIS — E1140 Type 2 diabetes mellitus with diabetic neuropathy, unspecified: Secondary | ICD-10-CM

## 2017-07-21 DIAGNOSIS — I10 Essential (primary) hypertension: Secondary | ICD-10-CM | POA: Diagnosis not present

## 2017-07-21 DIAGNOSIS — R7401 Elevation of levels of liver transaminase levels: Secondary | ICD-10-CM

## 2017-07-21 LAB — HEPATIC FUNCTION PANEL
ALBUMIN: 4.4 g/dL (ref 3.5–5.2)
ALT: 54 U/L — ABNORMAL HIGH (ref 0–53)
AST: 19 U/L (ref 0–37)
Alkaline Phosphatase: 100 U/L (ref 39–117)
Bilirubin, Direct: 0.1 mg/dL (ref 0.0–0.3)
TOTAL PROTEIN: 6.6 g/dL (ref 6.0–8.3)
Total Bilirubin: 0.6 mg/dL (ref 0.2–1.2)

## 2017-07-21 LAB — POCT GLYCOSYLATED HEMOGLOBIN (HGB A1C): HEMOGLOBIN A1C: 8

## 2017-07-21 MED ORDER — BASAGLAR KWIKPEN 100 UNIT/ML ~~LOC~~ SOPN: 60.0000 [IU] | PEN_INJECTOR | Freq: Every day | SUBCUTANEOUS | 3 refills | Status: DC

## 2017-07-21 NOTE — Progress Notes (Signed)
Subjective:     Patient ID: [name], male   DOB: September 29, 1950, 67 y.o.   MRN: [id number]  HPI Patient seen for medical follow-up. Type 2 diabetes. Recent poor compliance with diet. He takes long-acting insulin 60 units daily and he states his fasting blood sugars have consistently been below 120. He is on NovoLog sliding scale insulin. He does not have sliding scale with him today. No recent hypoglycemia. Enjoys gardening but no formal exercise.  He has hypertension treated with metoprolol, amlodipine, and lisinopril. No recent headaches. No recent chest pains. He has chronic back pain followed by pain management. Recent reduction morphine dose per pain management. He thinks this is causing some increased stress issues.  Left elbow pain. He states that he was raking in the garden couple weeks ago and hurt a popping sensation followed by severe left lateral elbow pain. He's had some soreness and he feels some weakness since then.  Has had some chronic shoulder pain but this pain involves the left elbow. He is concerned he may have "torn something ".  Patient had labs several months ago with mildly elevated ALT and minimally elevated alkaline phosphatase. Was taking a lot of ibuprofen then. No follow-up since then.  Past Medical History:  Diagnosis Date  . Arthritis    oa--and ra.  s/p right knee replacement on Jan 28, 2011--plans left knee replacement on 03/04/11.  also severe lower back pain-previous lumbar fusions, also pain in both hips, shoulders   . Asthma   . Diabetes mellitus   . Gallstone pancreatitis   . GERD (gastroesophageal reflux disease)   . Hepatitis    pancreatitis 2010  . Hyperlipidemia   . Hypertension    eccho 6/10, EKG 1/11, clearence Dr Caryl Never from 11/28/10 on chart  . Peripheral vascular disease (HCC)    states after arthroscopy left and right was given blood thinners- had symptoms of blood clot but wasnt diagnosed  . Pneumonia    2010  . Sleep apnea    study years ago/ no cpap/ states mild   Past Surgical History:  Procedure Laterality Date  . BACK SURGERY     hx of multiple back surgeries including lumbar fusion S1-L3  . CHOLECYSTECTOMY    . COLON SURGERY     removal of part of small intestine 1979  . ELBOW SURGERY     bilateral  . SHOULDER SURGERY     bilateral   . TOTAL KNEE ARTHROPLASTY  01/28/2011   Procedure: TOTAL KNEE ARTHROPLASTY;  Surgeon: Shelda Pal;  [facility]: [facility];  Service: Orthopedics;  Laterality: Right;  femoral nerve block preop.  Marland Kitchen TOTAL KNEE ARTHROPLASTY  03/04/2011   Procedure: TOTAL KNEE ARTHROPLASTY;  Surgeon: Shelda Pal;  [facility]: [facility];  Service: Orthopedics;  Laterality: Left;  . TUBAL LIGATION      reports that he has quit smoking. His smoking use included cigars. He quit smokeless tobacco use about 26 years ago. He reports that he does not drink alcohol or use drugs. family history is not on file. No Known Allergies   Review of Systems  Constitutional: Negative for appetite change, chills, fever and unexpected weight change.  Respiratory: Negative for shortness of breath.   Cardiovascular: Negative for chest pain and leg swelling.  Gastrointestinal: Negative for abdominal pain.  Genitourinary: Negative for dysuria.       Objective:   Physical Exam  Constitutional: He appears well-developed and well-nourished.  Cardiovascular: Normal rate and regular rhythm.  Pulmonary/Chest:  Effort normal and breath sounds normal.  Musculoskeletal: He exhibits no edema.  Left elbow reveals no warmth or erythema. No ecchymosis. Full range of motion. He has some tenderness over the left lateral epicondylar region. He has pain with wrist extension against resistance       Assessment:     #1 type 2 diabetes suboptimally controlled A1c 8.0%  #2 hypertension. Slightly elevated today but good control by home readings  #3 mild transaminase elevation by recent labs  #4 left elbow pain.-He has  tenderness over the lateral epicondylar region- suspect some tendinitis but patient is concerned he may have "torn "something    Plan:     -Continue current dose of Basaglar insulin as fasting blood sugars have consistently been well controlled -We have asked that he check some 2 hour postprandial blood sugars and write them down record them and alternate between breakfast and lunch and supper. Bring those back for review. Also bring in his sliding scale insulin for review as we may need to make some modifications -Repeat hepatic panel -Set up sports medicine referral regarding left elbow pain -Follow-up in 3 months to reassess and sooner as needed  Kristian Covey MD [facility] at [facility]

## 2017-07-21 NOTE — Patient Instructions (Signed)
Check some 2 hours postprandial sugars and get back to me  Bring in copy of your sliding scale insulin to review.    We are setting up sports medicine referral to evaluate left elbow pain.

## 2017-07-31 ENCOUNTER — Other Ambulatory Visit: Payer: Self-pay | Admitting: Anesthesiology

## 2017-07-31 ENCOUNTER — Ambulatory Visit
Admission: RE | Admit: 2017-07-31 | Discharge: 2017-07-31 | Disposition: A | Discharge status: Home / Self Care | Payer: BLUE CROSS/BLUE SHIELD | Source: Ambulatory Visit | Attending: Anesthesiology | Admitting: Anesthesiology

## 2017-07-31 DIAGNOSIS — M961 Postlaminectomy syndrome, not elsewhere classified: Secondary | ICD-10-CM | POA: Diagnosis not present

## 2017-07-31 DIAGNOSIS — M25522 Pain in left elbow: Secondary | ICD-10-CM

## 2017-07-31 DIAGNOSIS — Z79891 Long term (current) use of opiate analgesic: Secondary | ICD-10-CM | POA: Diagnosis not present

## 2017-07-31 DIAGNOSIS — S53402D Unspecified sprain of left elbow, subsequent encounter: Secondary | ICD-10-CM | POA: Diagnosis not present

## 2017-07-31 DIAGNOSIS — G894 Chronic pain syndrome: Secondary | ICD-10-CM | POA: Diagnosis not present

## 2017-08-03 ENCOUNTER — Encounter: Payer: Self-pay | Admitting: Family Medicine

## 2017-08-03 ENCOUNTER — Encounter: Payer: Self-pay | Admitting: *Deleted

## 2017-08-05 ENCOUNTER — Other Ambulatory Visit: Payer: Self-pay | Admitting: Anesthesiology

## 2017-08-05 DIAGNOSIS — M25522 Pain in left elbow: Secondary | ICD-10-CM

## 2017-08-09 ENCOUNTER — Ambulatory Visit
Admission: RE | Admit: 2017-08-09 | Discharge: 2017-08-09 | Disposition: A | Discharge status: Home / Self Care | Payer: BLUE CROSS/BLUE SHIELD | Source: Ambulatory Visit | Attending: Anesthesiology | Admitting: Anesthesiology

## 2017-08-09 DIAGNOSIS — M25522 Pain in left elbow: Secondary | ICD-10-CM

## 2017-08-09 DIAGNOSIS — S56512A Strain of other extensor muscle, fascia and tendon at forearm level, left arm, initial encounter: Secondary | ICD-10-CM | POA: Diagnosis not present

## 2017-08-18 ENCOUNTER — Ambulatory Visit: Payer: BLUE CROSS/BLUE SHIELD | Admitting: Family Medicine

## 2017-08-18 DIAGNOSIS — M7712 Lateral epicondylitis, left elbow: Secondary | ICD-10-CM | POA: Diagnosis not present

## 2017-08-19 ENCOUNTER — Other Ambulatory Visit: Payer: Self-pay

## 2017-08-19 DIAGNOSIS — M7712 Lateral epicondylitis, left elbow: Secondary | ICD-10-CM | POA: Diagnosis not present

## 2017-08-19 DIAGNOSIS — M6788 Other specified disorders of synovium and tendon, other site: Secondary | ICD-10-CM | POA: Diagnosis not present

## 2017-08-24 DIAGNOSIS — M25622 Stiffness of left elbow, not elsewhere classified: Secondary | ICD-10-CM | POA: Diagnosis not present

## 2017-08-24 DIAGNOSIS — M7712 Lateral epicondylitis, left elbow: Secondary | ICD-10-CM | POA: Diagnosis not present

## 2017-08-24 DIAGNOSIS — M25522 Pain in left elbow: Secondary | ICD-10-CM | POA: Diagnosis not present

## 2017-08-28 DIAGNOSIS — Z79891 Long term (current) use of opiate analgesic: Secondary | ICD-10-CM | POA: Diagnosis not present

## 2017-08-28 DIAGNOSIS — M961 Postlaminectomy syndrome, not elsewhere classified: Secondary | ICD-10-CM | POA: Diagnosis not present

## 2017-08-28 DIAGNOSIS — S53402D Unspecified sprain of left elbow, subsequent encounter: Secondary | ICD-10-CM | POA: Diagnosis not present

## 2017-08-28 DIAGNOSIS — G894 Chronic pain syndrome: Secondary | ICD-10-CM | POA: Diagnosis not present

## 2017-09-03 ENCOUNTER — Telehealth: Payer: Self-pay | Admitting: Family Medicine

## 2017-09-03 NOTE — Telephone Encounter (Signed)
Copied from CRM [id number]. Topic: Quick Communication - See Telephone Encounter >> Sep 03, 2017  9:29 AM Lorrine KinMcGee, Nygeria Lager B, NT wrote: CRM for notification. See Telephone encounter for: 09/03/17. Patient's wife calling and states that needs to change his Insulin Glargine (BASAGLAR KWIKPEN) 100 UNIT/ML SOPN. States that his A1C was high at visit and Dr Caryl NeverBurchette wanted him to switch to taking it twice a day. Requesting a new rx with the new instructions. [id number] - [place], [place] - [address] Requesting the results from 07/21/17 be mailed to them. States that they could never get anyone on the line to give them results. States that results can be left on a voicemail/answering machine if there is no answer. CB#: [phone]

## 2017-09-04 MED ORDER — BASAGLAR KWIKPEN 100 UNIT/ML ~~LOC~~ SOPN: 60.0000 [IU] | PEN_INJECTOR | Freq: Every day | SUBCUTANEOUS | 3 refills | Status: DC

## 2017-09-04 NOTE — Telephone Encounter (Signed)
Left detailed message on machine for patient with lab results and a refill sent for basaglar once daily

## 2017-09-04 NOTE — Telephone Encounter (Signed)
I am confused by call in note. Our last note stated to keep Basaglar dosage the same

## 2017-09-04 NOTE — Telephone Encounter (Signed)
07/21/17 office visit - -Continue current dose of Basaglar insulin as fasting blood sugars have consistently been well controlled   Please advise

## 2017-09-08 ENCOUNTER — Encounter: Payer: Self-pay | Admitting: Family Medicine

## 2017-09-08 ENCOUNTER — Ambulatory Visit: Payer: BLUE CROSS/BLUE SHIELD | Admitting: Family Medicine

## 2017-09-08 VITALS — BP 140/90 | HR 72 | Temp 97.6°F | Wt 279.2 lb

## 2017-09-08 DIAGNOSIS — E1140 Type 2 diabetes mellitus with diabetic neuropathy, unspecified: Secondary | ICD-10-CM

## 2017-09-08 DIAGNOSIS — E1165 Type 2 diabetes mellitus with hyperglycemia: Secondary | ICD-10-CM

## 2017-09-08 DIAGNOSIS — IMO0002 Reserved for concepts with insufficient information to code with codable children: Secondary | ICD-10-CM

## 2017-09-08 NOTE — Progress Notes (Signed)
Subjective:     Patient ID: [name], male   DOB: 11/30/1950, 67 y.o.   MRN: [id number]  HPI Patient seen for follow-up. They called recently and there was some confusion regarding his insulin. He takes Hospital doctorBasaglar -is on dosage of 60 units twice daily. This was listed in his chart as once daily. Has been on twice daily regimen for quite some time. He also takes NovoLog sliding scale. He had recent surgery unfortunately for left forearm for tendon ruptures.. He has not been very active because of that.  Past Medical History:  Diagnosis Date  . Arthritis    oa--and ra.  s/p right knee replacement on Jan 28, 2011--plans left knee replacement on 03/04/11.  also severe lower back pain-previous lumbar fusions, also pain in both hips, shoulders   . Asthma   . Diabetes mellitus   . Gallstone pancreatitis   . GERD (gastroesophageal reflux disease)   . Hepatitis    pancreatitis 2010  . Hyperlipidemia   . Hypertension    eccho 6/10, EKG 1/11, clearence Dr Caryl NeverBurchette from 11/28/10 on chart  . Peripheral vascular disease (HCC)    states after arthroscopy left and right was given blood thinners- had symptoms of blood clot but wasnt diagnosed  . Pneumonia    2010  . Sleep apnea    study years ago/ no cpap/ states mild   Past Surgical History:  Procedure Laterality Date  . BACK SURGERY     hx of multiple back surgeries including lumbar fusion S1-L3  . CHOLECYSTECTOMY    . COLON SURGERY     removal of part of small intestine 1979  . ELBOW SURGERY     bilateral  . SHOULDER SURGERY     bilateral   . TOTAL KNEE ARTHROPLASTY  01/28/2011   Procedure: TOTAL KNEE ARTHROPLASTY;  Surgeon: Shelda PalMatthew D Olin;  Location: [facility];  Service: Orthopedics;  Laterality: Right;  femoral nerve block preop.  Marland Kitchen. TOTAL KNEE ARTHROPLASTY  03/04/2011   Procedure: TOTAL KNEE ARTHROPLASTY;  Surgeon: Shelda PalMatthew D Olin;  [facility]: [facility];  Service: Orthopedics;  Laterality: Left;  . TUBAL LIGATION      reports that he  has quit smoking. His smoking use included cigars. He quit smokeless tobacco use about 26 years ago. He reports that he does not drink alcohol or use drugs. family history is not on file. No Known Allergies   Review of Systems  Constitutional: Negative for fatigue.  Eyes: Negative for visual disturbance.  Respiratory: Negative for cough, chest tightness and shortness of breath.   Cardiovascular: Negative for chest pain, palpitations and leg swelling.  Neurological: Negative for dizziness, syncope, weakness, light-headedness and headaches.       Objective:   Physical Exam  Constitutional: He is oriented to person, place, and time. He appears well-developed and well-nourished.  HENT:  Right Ear: External ear normal.  Left Ear: External ear normal.  Mouth/Throat: Oropharynx is clear and moist.  Eyes: Pupils are equal, round, and reactive to light.  Neck: Neck supple. No thyromegaly present.  Cardiovascular: Normal rate and regular rhythm.  Pulmonary/Chest: Effort normal and breath sounds normal. No respiratory distress. He has no wheezes. He has no rales.  Musculoskeletal: He exhibits no edema.  Neurological: He is alert and oriented to person, place, and time.       Assessment:     Type 2 diabetes. Suboptimal control with recent A1c 8.0%    Plan:     -We've asked that he  record frequent blood sugars and bring in for review at next visit -Also we've asked the bring in copy of his sliding scale NovoLog he is using. This may need to be adjusted -He should remain on Basaglar 60 units bid  Kristian Covey MD [facility] at [facility]

## 2017-09-08 NOTE — Patient Instructions (Signed)
Bring in blood sugars and your sliding scale for review at next visit.

## 2017-09-15 ENCOUNTER — Other Ambulatory Visit: Payer: Self-pay

## 2017-09-15 MED ORDER — BASAGLAR KWIKPEN 100 UNIT/ML ~~LOC~~ SOPN: 60.0000 [IU] | PEN_INJECTOR | Freq: Two times a day (BID) | SUBCUTANEOUS | 1 refills | Status: DC

## 2017-09-20 ENCOUNTER — Other Ambulatory Visit: Payer: Self-pay | Admitting: Family Medicine

## 2017-09-21 ENCOUNTER — Other Ambulatory Visit: Payer: Self-pay | Admitting: Family Medicine

## 2017-09-22 ENCOUNTER — Other Ambulatory Visit: Payer: Self-pay | Admitting: *Deleted

## 2017-09-22 MED ORDER — BASAGLAR KWIKPEN 100 UNIT/ML ~~LOC~~ SOPN: PEN_INJECTOR | SUBCUTANEOUS | 3 refills | Status: DC

## 2017-09-28 DIAGNOSIS — S53402D Unspecified sprain of left elbow, subsequent encounter: Secondary | ICD-10-CM | POA: Diagnosis not present

## 2017-09-28 DIAGNOSIS — Z79891 Long term (current) use of opiate analgesic: Secondary | ICD-10-CM | POA: Diagnosis not present

## 2017-09-28 DIAGNOSIS — G894 Chronic pain syndrome: Secondary | ICD-10-CM | POA: Diagnosis not present

## 2017-09-28 DIAGNOSIS — M961 Postlaminectomy syndrome, not elsewhere classified: Secondary | ICD-10-CM | POA: Diagnosis not present

## 2017-09-30 DIAGNOSIS — M7712 Lateral epicondylitis, left elbow: Secondary | ICD-10-CM | POA: Diagnosis not present

## 2017-09-30 DIAGNOSIS — M25622 Stiffness of left elbow, not elsewhere classified: Secondary | ICD-10-CM | POA: Diagnosis not present

## 2017-09-30 DIAGNOSIS — M79632 Pain in left forearm: Secondary | ICD-10-CM | POA: Diagnosis not present

## 2017-10-13 ENCOUNTER — Other Ambulatory Visit: Payer: Self-pay | Admitting: *Deleted

## 2017-10-13 MED ORDER — BASAGLAR KWIKPEN 100 UNIT/ML ~~LOC~~ SOPN: PEN_INJECTOR | SUBCUTANEOUS | 3 refills | Status: DC

## 2017-10-20 ENCOUNTER — Other Ambulatory Visit: Payer: Self-pay | Admitting: *Deleted

## 2017-10-20 ENCOUNTER — Ambulatory Visit: Payer: BLUE CROSS/BLUE SHIELD | Admitting: Family Medicine

## 2017-10-20 MED ORDER — GLUCOSE BLOOD VI STRP: ORAL_STRIP | 1 refills | Status: DC

## 2017-10-23 ENCOUNTER — Telehealth: Payer: Self-pay | Admitting: Family Medicine

## 2017-10-23 NOTE — Telephone Encounter (Signed)
Copied from CRM [phone]#[id number]. Topic: Quick Communication - Rx Refill/Question >> Oct 23, 2017 12:45 PM Tamela OddiHarris, Izzy Doubek J wrote: Medication: glucose blood (ONE TOUCH ULTRA TEST) test strip  Patient called to request the above medication.  CB# [phone]  Preferred Pharmacy (with phone number or street name): [id number] [place], [place] - [address] [address] [facility] [address] [phone] (Phone) [phone] (Fax)

## 2017-10-26 ENCOUNTER — Telehealth: Payer: Self-pay | Admitting: *Deleted

## 2017-10-26 NOTE — Telephone Encounter (Signed)
Patient's wife called and states they do not want to change to AccuChek.

## 2017-10-26 NOTE — Telephone Encounter (Signed)
Request given to Randa EvensJoanne for a prior auth.

## 2017-10-26 NOTE — Telephone Encounter (Signed)
Prior auth for Newell RubbermaidneTouch Ultra Blue test strips sent to [email or web address] [id number].

## 2017-10-26 NOTE — Telephone Encounter (Signed)
Insurance no longer covering OneTouch.  Okay to change to Accu-chek Pls?  CRM created

## 2017-11-11 ENCOUNTER — Encounter: Payer: BLUE CROSS/BLUE SHIELD | Admitting: Family Medicine

## 2017-11-25 DIAGNOSIS — Z79891 Long term (current) use of opiate analgesic: Secondary | ICD-10-CM | POA: Diagnosis not present

## 2017-11-25 DIAGNOSIS — S53402D Unspecified sprain of left elbow, subsequent encounter: Secondary | ICD-10-CM | POA: Diagnosis not present

## 2017-11-25 DIAGNOSIS — G894 Chronic pain syndrome: Secondary | ICD-10-CM | POA: Diagnosis not present

## 2017-11-25 DIAGNOSIS — M961 Postlaminectomy syndrome, not elsewhere classified: Secondary | ICD-10-CM | POA: Diagnosis not present

## 2017-12-18 ENCOUNTER — Encounter: Payer: BLUE CROSS/BLUE SHIELD | Admitting: Family Medicine

## 2017-12-28 ENCOUNTER — Other Ambulatory Visit: Payer: Self-pay | Admitting: Family Medicine

## 2018-01-11 ENCOUNTER — Other Ambulatory Visit: Payer: Self-pay | Admitting: Family Medicine

## 2018-01-15 ENCOUNTER — Encounter: Payer: BLUE CROSS/BLUE SHIELD | Admitting: Family Medicine

## 2018-01-26 DIAGNOSIS — G894 Chronic pain syndrome: Secondary | ICD-10-CM | POA: Diagnosis not present

## 2018-01-26 DIAGNOSIS — M961 Postlaminectomy syndrome, not elsewhere classified: Secondary | ICD-10-CM | POA: Diagnosis not present

## 2018-01-26 DIAGNOSIS — M25551 Pain in right hip: Secondary | ICD-10-CM | POA: Diagnosis not present

## 2018-01-26 DIAGNOSIS — S53402D Unspecified sprain of left elbow, subsequent encounter: Secondary | ICD-10-CM | POA: Diagnosis not present

## 2018-01-27 ENCOUNTER — Encounter: Payer: Self-pay | Admitting: Family Medicine

## 2018-01-27 ENCOUNTER — Ambulatory Visit (INDEPENDENT_AMBULATORY_CARE_PROVIDER_SITE_OTHER): Payer: BLUE CROSS/BLUE SHIELD | Admitting: Family Medicine

## 2018-01-27 ENCOUNTER — Other Ambulatory Visit: Payer: Self-pay

## 2018-01-27 VITALS — BP 146/90 | HR 79 | Temp 97.9°F | Wt 281.6 lb

## 2018-01-27 DIAGNOSIS — Z23 Encounter for immunization: Secondary | ICD-10-CM | POA: Diagnosis not present

## 2018-01-27 DIAGNOSIS — I10 Essential (primary) hypertension: Secondary | ICD-10-CM | POA: Diagnosis not present

## 2018-01-27 DIAGNOSIS — E1140 Type 2 diabetes mellitus with diabetic neuropathy, unspecified: Secondary | ICD-10-CM

## 2018-01-27 DIAGNOSIS — IMO0002 Reserved for concepts with insufficient information to code with codable children: Secondary | ICD-10-CM

## 2018-01-27 DIAGNOSIS — E785 Hyperlipidemia, unspecified: Secondary | ICD-10-CM | POA: Diagnosis not present

## 2018-01-27 DIAGNOSIS — E1165 Type 2 diabetes mellitus with hyperglycemia: Secondary | ICD-10-CM

## 2018-01-27 LAB — HEPATIC FUNCTION PANEL
ALT: 70 U/L — AB (ref 0–53)
AST: 64 U/L — ABNORMAL HIGH (ref 0–37)
Albumin: 4.5 g/dL (ref 3.5–5.2)
Alkaline Phosphatase: 100 U/L (ref 39–117)
BILIRUBIN DIRECT: 0.2 mg/dL (ref 0.0–0.3)
Total Bilirubin: 0.8 mg/dL (ref 0.2–1.2)
Total Protein: 6.6 g/dL (ref 6.0–8.3)

## 2018-01-27 LAB — LIPID PANEL
CHOL/HDL RATIO: 3
CHOLESTEROL: 165 mg/dL (ref 0–200)
HDL: 57.1 mg/dL (ref >39.00)
NONHDL: 107.84
Triglycerides: 208 mg/dL — ABNORMAL HIGH (ref 0.0–149.0)
VLDL: 41.6 mg/dL — AB (ref 0.0–40.0)

## 2018-01-27 LAB — POCT GLYCOSYLATED HEMOGLOBIN (HGB A1C): Hemoglobin A1C: 8 % — AB (ref 4.0–5.6)

## 2018-01-27 LAB — BASIC METABOLIC PANEL
BUN: 14 mg/dL (ref 6–23)
CALCIUM: 9.3 mg/dL (ref 8.4–10.5)
CHLORIDE: 101 meq/L (ref 96–112)
CO2: 28 mEq/L (ref 19–32)
CREATININE: 1.04 mg/dL (ref 0.40–1.50)
GFR: 76.17 mL/min (ref >60.00)
Glucose, Bld: 248 mg/dL — ABNORMAL HIGH (ref 70–99)
Potassium: 4.2 mEq/L (ref 3.5–5.1)
Sodium: 139 mEq/L (ref 135–145)

## 2018-01-27 LAB — LDL CHOLESTEROL, DIRECT: Direct LDL: 102 mg/dL

## 2018-01-27 MED ORDER — AMLODIPINE BESYLATE 10 MG PO TABS: 10.0000 mg | ORAL_TABLET | Freq: Every day | ORAL | 3 refills | Status: DC

## 2018-01-27 NOTE — Progress Notes (Signed)
Subjective:     Patient ID: [name], male   DOB: [birth year], 67 y.o.   MRN: [id number]  HPI Patient here for medical follow-up.  He has history of hyperlipidemia, type 2 diabetes, obesity, hypertension, chronic back pain.  Hypertension.  Treated with 3 drug regimen.  These were reviewed.  Compliant with medications.  No headaches or dizziness.  Home blood pressures fairly consistently run 140 systolic and 80s diastolic.  Type 2 diabetes.  Last A1c 8.0%.  Medications reviewed.  We reviewed his NovoLog scale which is fairly aggressive.  He feels that his long-acting insulin is not working as good as prior insulin but this was required by his insurance.  No recent hypoglycemia.  On Basaglar 60 units every 12 hours.  Hyperlipidemia treated with statin.  Due for lipids.  No myalgias.  Still needs flu vaccine and also needs Prevnar 13.  Is due for eye exam.  Past Medical History:  Diagnosis Date  . Arthritis    oa--and ra.  s/p right knee replacement on Jan 28, 2011--plans left knee replacement on 03/04/11.  also severe lower back pain-previous lumbar fusions, also pain in both hips, shoulders   . Asthma   . Diabetes mellitus   . Gallstone pancreatitis   . GERD (gastroesophageal reflux disease)   . Hepatitis    pancreatitis 2010  . Hyperlipidemia   . Hypertension    eccho 6/10, EKG 1/11, clearence Dr Caryl Never from 11/28/10 on chart  . Peripheral vascular disease (HCC)    states after arthroscopy left and right was given blood thinners- had symptoms of blood clot but wasnt diagnosed  . Pneumonia    2010  . Sleep apnea    study years ago/ no cpap/ states mild   Past Surgical History:  Procedure Laterality Date  . BACK SURGERY     hx of multiple back surgeries including lumbar fusion S1-L3  . CHOLECYSTECTOMY    . COLON SURGERY     removal of part of small intestine 1979  . ELBOW SURGERY     bilateral  . SHOULDER SURGERY     bilateral   . TOTAL KNEE ARTHROPLASTY  01/28/2011    Procedure: TOTAL KNEE ARTHROPLASTY;  Surgeon: Shelda Pal;  Location: [facility];  Service: Orthopedics;  Laterality: Right;  femoral nerve block preop.  Marland Kitchen TOTAL KNEE ARTHROPLASTY  03/04/2011   Procedure: TOTAL KNEE ARTHROPLASTY;  Surgeon: Shelda Pal;  [facility]: [facility];  Service: Orthopedics;  Laterality: Left;  . TUBAL LIGATION      reports that he has quit smoking. His smoking use included cigars. He quit smokeless tobacco use about 27 years ago. He reports that he does not drink alcohol or use drugs. family history is not on file. Allergies  Allergen Reactions  . Cortisone Itching    Hives and causes blood sugar to elevate     Review of Systems  Constitutional: Negative for fatigue.  Eyes: Negative for visual disturbance.  Respiratory: Negative for cough, chest tightness and shortness of breath.   Cardiovascular: Negative for chest pain, palpitations and leg swelling.  Endocrine: Negative for polydipsia and polyuria.  Musculoskeletal: Positive for arthralgias and back pain.  Neurological: Negative for dizziness, syncope, weakness, light-headedness and headaches.       Objective:   Physical Exam  Constitutional: He is oriented to person, place, and time. He appears well-developed and well-nourished.  HENT:  Right Ear: External ear normal.  Left Ear: External ear normal.  Mouth/Throat: Oropharynx is  clear and moist.  Eyes: Pupils are equal, round, and reactive to light.  Neck: Neck supple. No thyromegaly present.  Cardiovascular: Normal rate and regular rhythm.  Pulmonary/Chest: Effort normal and breath sounds normal. No respiratory distress. He has no wheezes. He has no rales.  Musculoskeletal: He exhibits no edema.  Neurological: He is alert and oriented to person, place, and time.  Skin:  Feet reveal no skin lesions. Good distal foot pulses. Good capillary refill. No calluses. Normal sensation with monofilament testing        Assessment:     #1 hypertension  poorly controlled  #2 type 2 diabetes on insulin suboptimally controlled  #3 dyslipidemia    Plan:     -Increase amlodipine to 10 mg daily -We strongly advise he tighten up his diet and will plan 44-month follow-up if A1c not further to goal at that point consider further adjustment medications.  He is not a candidate for GLP-1 medication because of prior history of pancreas issues -Flu vaccine and Prevnar 13 given.  He will need another Pneumovax in about a year -Check labs with lipid, hepatic, basic metabolic panel  Kristian Covey MD [facility] at [facility]

## 2018-01-27 NOTE — Patient Instructions (Signed)
Increase the Amlodipine to 10 mg daily;  

## 2018-02-15 ENCOUNTER — Ambulatory Visit
Admission: RE | Admit: 2018-02-15 | Discharge: 2018-02-15 | Disposition: A | Discharge status: Home / Self Care | Payer: BLUE CROSS/BLUE SHIELD | Source: Ambulatory Visit | Attending: Anesthesiology | Admitting: Anesthesiology

## 2018-02-15 ENCOUNTER — Other Ambulatory Visit: Payer: Self-pay | Admitting: Anesthesiology

## 2018-02-15 DIAGNOSIS — M1611 Unilateral primary osteoarthritis, right hip: Secondary | ICD-10-CM | POA: Diagnosis not present

## 2018-02-15 DIAGNOSIS — M25551 Pain in right hip: Secondary | ICD-10-CM

## 2018-03-08 ENCOUNTER — Other Ambulatory Visit: Payer: Self-pay | Admitting: Family Medicine

## 2018-03-08 NOTE — Telephone Encounter (Signed)
Copied from CRM [phone]#[id number]. Topic: Quick Communication - See Telephone Encounter >> Mar 08, 2018  1:20 PM Angela NevinWilliams, Candice N wrote: CRM for notification. See Telephone encounter for: 03/08/18.  Patients is requesting refill of Insulin Pen Needle 31G X 5 MM MISC. Patient is requesting a supply of 100. Patient has two left. Please advise.   [id number] [place], [place] - [address] [facility] [address] [phone] (Phone) [phone] (Fax)

## 2018-03-09 MED ORDER — INSULIN PEN NEEDLE 31G X 5 MM MISC: 1 refills | Status: DC

## 2018-03-09 NOTE — Telephone Encounter (Signed)
Requested Prescriptions  Pending Prescriptions Disp Refills  . Insulin Pen Needle 31G X 5 MM MISC 100 each 1    Sig: Use as directed with [name]     Endocrinology: Diabetes - Testing Supplies Passed - 03/08/2018  1:25 PM      Passed - Valid encounter within last 12 months    Recent Outpatient Visits          1 month ago Essential hypertension   [facility] Michael FortisBruce Mays, [name]   6 months ago Type 2 diabetes, uncontrolled, with neuropathy (HCC)   [facility] Bur[facility], Michael FortisBruce Mays, [name]   7 months ago Type 2 diabetes, uncontrolled, with neuropathy (HCC)   [facility] Bur[facility], Michael FortisBruce Mays, [name]   1 year ago Essential hypertension   [facility] Bur[facility], Michael FortisBruce Mays, [name]   1 year ago Type 2 diabetes, uncontrolled, with neuropathy (HCC)   [facility] Bur[facility], Michael FortisBruce Mays, [name]      Future Appointments            In 1 month Burchette, Michael FortisBruce Mays, [name] [facility]

## 2018-03-12 ENCOUNTER — Other Ambulatory Visit: Payer: Self-pay | Admitting: Family Medicine

## 2018-03-25 DIAGNOSIS — S53402D Unspecified sprain of left elbow, subsequent encounter: Secondary | ICD-10-CM | POA: Diagnosis not present

## 2018-03-25 DIAGNOSIS — G894 Chronic pain syndrome: Secondary | ICD-10-CM | POA: Diagnosis not present

## 2018-03-25 DIAGNOSIS — M25551 Pain in right hip: Secondary | ICD-10-CM | POA: Diagnosis not present

## 2018-03-25 DIAGNOSIS — M961 Postlaminectomy syndrome, not elsewhere classified: Secondary | ICD-10-CM | POA: Diagnosis not present

## 2018-04-26 ENCOUNTER — Telehealth: Payer: Self-pay | Admitting: Family Medicine

## 2018-04-26 NOTE — Telephone Encounter (Signed)
[facility].  Needing to start a PA on The PepsiBasaglar Kwikpen - Nonformulary request Basaglar started 10/14/2010 Dx E11.40, E11.65 Tried Levemir, currently on Novolog  Determination in 48-72 hrs Reference # [id number]  [facility]

## 2018-04-28 NOTE — Telephone Encounter (Signed)
I called [facility] at the number below and spoke with Huntley Dec.  She stated the request was approved on 2/3, [facility] and informed [facility] of this.

## 2018-04-30 ENCOUNTER — Ambulatory Visit: Payer: BLUE CROSS/BLUE SHIELD | Admitting: Family Medicine

## 2018-05-20 DIAGNOSIS — M25551 Pain in right hip: Secondary | ICD-10-CM | POA: Diagnosis not present

## 2018-05-20 DIAGNOSIS — G894 Chronic pain syndrome: Secondary | ICD-10-CM | POA: Diagnosis not present

## 2018-05-20 DIAGNOSIS — M961 Postlaminectomy syndrome, not elsewhere classified: Secondary | ICD-10-CM | POA: Diagnosis not present

## 2018-05-20 DIAGNOSIS — S53402D Unspecified sprain of left elbow, subsequent encounter: Secondary | ICD-10-CM | POA: Diagnosis not present

## 2018-06-01 ENCOUNTER — Other Ambulatory Visit: Payer: Self-pay

## 2018-06-01 ENCOUNTER — Ambulatory Visit (INDEPENDENT_AMBULATORY_CARE_PROVIDER_SITE_OTHER): Payer: PPO | Admitting: Family Medicine

## 2018-06-01 ENCOUNTER — Encounter: Payer: Self-pay | Admitting: Family Medicine

## 2018-06-01 VITALS — BP 142/70 | HR 86 | Temp 98.2°F | Wt 264.3 lb

## 2018-06-01 DIAGNOSIS — R058 Other specified cough: Secondary | ICD-10-CM

## 2018-06-01 DIAGNOSIS — R05 Cough: Secondary | ICD-10-CM | POA: Diagnosis not present

## 2018-06-01 DIAGNOSIS — J0110 Acute frontal sinusitis, unspecified: Secondary | ICD-10-CM | POA: Diagnosis not present

## 2018-06-01 MED ORDER — AMOXICILLIN-POT CLAVULANATE 875-125 MG PO TABS: 1.0000 | ORAL_TABLET | Freq: Two times a day (BID) | ORAL | 0 refills | Status: DC

## 2018-06-01 NOTE — Progress Notes (Signed)
Subjective:     Patient ID: [name], male   DOB: 07/02/1950, 68 y.o.   MRN: [id number]  HPI Patient is seen with onset of acute illness around last Friday.  He developed some increased body aches along with some ear pain nausea chest congestion cough fatigue.  Wife had similar symptoms.  Patient states he had fever up to 103 on Sunday but some improved since then.  Had some intermittent vertigo symptoms.  Cough has been productive of thick green sputum.  Has had some frontal and maxillary sinus pressure.  Appetite is down.  He is keeping down fluids.  Had one hypoglycemic episode couple days ago.  He has backed down on his insulin doses since he has been sick  Past Medical History:  Diagnosis Date  . Arthritis    oa--and ra.  s/p right knee replacement on Jan 28, 2011--plans left knee replacement on 03/04/11.  also severe lower back pain-previous lumbar fusions, also pain in both hips, shoulders   . Asthma   . Diabetes mellitus   . Gallstone pancreatitis   . GERD (gastroesophageal reflux disease)   . Hepatitis    pancreatitis 2010  . Hyperlipidemia   . Hypertension    eccho 6/10, EKG 1/11, clearence Dr Caryl Never from 11/28/10 on chart  . Peripheral vascular disease (HCC)    states after arthroscopy left and right was given blood thinners- had symptoms of blood clot but wasnt diagnosed  . Pneumonia    2010  . Sleep apnea    study years ago/ no cpap/ states mild   Past Surgical History:  Procedure Laterality Date  . BACK SURGERY     hx of multiple back surgeries including lumbar fusion S1-L3  . CHOLECYSTECTOMY    . COLON SURGERY     removal of part of small intestine 1979  . ELBOW SURGERY     bilateral  . SHOULDER SURGERY     bilateral   . TOTAL KNEE ARTHROPLASTY  01/28/2011   Procedure: TOTAL KNEE ARTHROPLASTY;  Surgeon: Shelda Pal;  [facility];  Service: Orthopedics;  Laterality: Right;  femoral nerve block preop.  Marland Kitchen TOTAL KNEE ARTHROPLASTY  03/04/2011   Procedure: TOTAL KNEE ARTHROPLASTY;  Surgeon: Shelda Pal;  [facility];  Service: Orthopedics;  Laterality: Left;  . TUBAL LIGATION      reports that he has quit smoking. His smoking use included cigars. He quit smokeless tobacco use about 27 years ago. He reports that he does not drink alcohol or use drugs. family history is not on file. Allergies  Allergen Reactions  . Cortisone Itching    Hives and causes blood sugar to elevate     Review of Systems  Constitutional: Positive for chills, fatigue and fever.  HENT: Positive for congestion, sinus pressure and sinus pain.   Respiratory: Positive for cough.   Cardiovascular: Negative for chest pain.  Neurological: Positive for dizziness. Negative for syncope.       Objective:   Physical Exam Constitutional:      Appearance: Normal appearance.  HENT:     Ears:     Comments: Right eardrum is slightly erythematous and slightly retracted.  Left appears normal    Mouth/Throat:     Pharynx: Oropharynx is clear. No oropharyngeal exudate.  Neck:     Musculoskeletal: Neck supple.  Cardiovascular:     Rate and Rhythm: Normal rate and regular rhythm.  Pulmonary:     Effort: Pulmonary effort is normal.  Breath sounds: Normal breath sounds. No wheezing or rales.  Neurological:     Mental Status: He is alert.        Assessment:     Acute febrile illness.  We elected not to screen for influenza since he is more than 48 hours into illness.  He has developed worsening sinus symptoms and cough.  High risk for complication with his age and diabetes    Plan:     -Plenty of fluids and rest -Offered medication for nausea but his symptoms are improved today -We elected to go and cover with Augmentin 875 mg twice daily for 10 days and take with food for his sinusitis symptoms. -Follow-up immediately for increased shortness of breath or any worsening symptoms  Kristian Covey MD [facility] at [facility]

## 2018-06-01 NOTE — Patient Instructions (Signed)
Follow up for any increased shortness of breath or other concerns  Plenty of fluids and rest.

## 2018-06-02 ENCOUNTER — Ambulatory Visit: Payer: BLUE CROSS/BLUE SHIELD | Admitting: Family Medicine

## 2018-06-15 ENCOUNTER — Telehealth: Payer: Self-pay

## 2018-06-15 NOTE — Telephone Encounter (Signed)
Please see message. °

## 2018-06-15 NOTE — Telephone Encounter (Signed)
augmentin has fairly broad coverage.  What are his residual symptoms?  IF he has ear effusion, can take several weeks unfortunately to clear.

## 2018-06-15 NOTE — Telephone Encounter (Signed)
Copied from CRM [id number]. Topic: General - Other >> Jun 15, 2018  3:36 PM Jaquita Rector A wrote: Reason for CRM: Patient wife called to say that he is still having symptoms even after he finished taking the antibiotics. She think there may still be fluid in his ear causing the disorientation say symptoms have not changed.... Ph# [phone]

## 2018-06-15 NOTE — Telephone Encounter (Signed)
Called patient and spoke to his wife and she stated that he does have ear effusion and patient has been having terrible vertigo and still feels congested. Please advise.

## 2018-06-16 NOTE — Telephone Encounter (Signed)
Called patient and gave him the message from Dr. Caryl Never and patient verbalized an understanding.

## 2018-06-16 NOTE — Telephone Encounter (Signed)
Recommend he be taking OTC plain Mucinex 1,200 mg twice daily.and if he has not tried Flonase - add flonase nasal 2 sprays per nostril once daily.

## 2018-07-06 ENCOUNTER — Other Ambulatory Visit: Payer: Self-pay | Admitting: Family Medicine

## 2018-07-16 ENCOUNTER — Encounter: Payer: Self-pay | Admitting: *Deleted

## 2018-07-16 ENCOUNTER — Other Ambulatory Visit: Payer: Self-pay | Admitting: Pharmacist

## 2018-07-16 NOTE — Patient Outreach (Signed)
[facility]) Care Management  07/16/2018  [name] Jul 17, 1950 [id number]  Patient was called about medication assistance per referral and HRA. Unfortunately, he did not answer the phone. HIPAA compliant message was left on the patient's voicemail.  Plan: Call patient back in 3-5 business days.   [name], PharmD, BCACP [facility] Clinical Pharmacist [phone]

## 2018-07-18 ENCOUNTER — Other Ambulatory Visit: Payer: Self-pay | Admitting: Family Medicine

## 2018-07-19 DIAGNOSIS — G894 Chronic pain syndrome: Secondary | ICD-10-CM | POA: Diagnosis not present

## 2018-07-19 DIAGNOSIS — M25551 Pain in right hip: Secondary | ICD-10-CM | POA: Diagnosis not present

## 2018-07-19 DIAGNOSIS — S53402D Unspecified sprain of left elbow, subsequent encounter: Secondary | ICD-10-CM | POA: Diagnosis not present

## 2018-07-19 DIAGNOSIS — M961 Postlaminectomy syndrome, not elsewhere classified: Secondary | ICD-10-CM | POA: Diagnosis not present

## 2018-07-20 NOTE — Telephone Encounter (Signed)
Called patient and left a voice message to let him know that he needs to schedule a telephone appointment (Doxy) with Dr. Caryl Never for a diabetic follow up so we can send in his medications.  OK for PEC to discuss/advise/schedule patient for diabetes follow up.  CRM Created.

## 2018-07-26 ENCOUNTER — Telehealth: Payer: Self-pay | Admitting: Pharmacist

## 2018-07-26 NOTE — Patient Outreach (Signed)
[facility]) Care Management  07/26/2018  [name] [birth year] [id number]   Patient was called to reschedule his appointment as his wife called and left a message at the [facility] office that they would not be in during the my schedule phone call. HIPAA compliant message was left on their voicemail.    Plan: Patient was rescheduled for: 08/10/2018 due to upcoming PAL.   [name], PharmD, BCACP [facility] Clinical Pharmacist [phone]

## 2018-07-27 ENCOUNTER — Ambulatory Visit (INDEPENDENT_AMBULATORY_CARE_PROVIDER_SITE_OTHER): Payer: PPO | Admitting: Family Medicine

## 2018-07-27 ENCOUNTER — Other Ambulatory Visit: Payer: Self-pay

## 2018-07-27 ENCOUNTER — Ambulatory Visit: Payer: Self-pay | Admitting: Pharmacist

## 2018-07-27 DIAGNOSIS — I10 Essential (primary) hypertension: Secondary | ICD-10-CM | POA: Diagnosis not present

## 2018-07-27 DIAGNOSIS — E785 Hyperlipidemia, unspecified: Secondary | ICD-10-CM | POA: Diagnosis not present

## 2018-07-27 DIAGNOSIS — E1165 Type 2 diabetes mellitus with hyperglycemia: Secondary | ICD-10-CM

## 2018-07-27 DIAGNOSIS — IMO0002 Reserved for concepts with insufficient information to code with codable children: Secondary | ICD-10-CM

## 2018-07-27 DIAGNOSIS — K760 Fatty (change of) liver, not elsewhere classified: Secondary | ICD-10-CM | POA: Insufficient documentation

## 2018-07-27 DIAGNOSIS — E1140 Type 2 diabetes mellitus with diabetic neuropathy, unspecified: Secondary | ICD-10-CM | POA: Diagnosis not present

## 2018-07-27 MED ORDER — INSULIN GLARGINE 100 UNITS/ML SOLOSTAR PEN: PEN_INJECTOR | SUBCUTANEOUS | 11 refills | Status: DC

## 2018-07-27 NOTE — Progress Notes (Signed)
Patient ID: [name], male   DOB: March 10, 1951, 68 y.o.   MRN: [id number]  This visit type was conducted due to national recommendations for restrictions regarding the COVID-19 pandemic in an effort to limit this patient's exposure and mitigate transmission in our community.   Virtual Visit via Telephone Note  I connected with [name] on 07/27/18 at  8:30 AM EDT by telephone and verified that I am speaking with the correct person using two identifiers.   I discussed the limitations, risks, security and privacy concerns of performing an evaluation and management service by telephone and the availability of in person appointments. I also discussed with the patient that there may be a patient responsible charge related to this service. The patient expressed understanding and agreed to proceed.  Location patient: home Location provider: work or home office Participants present for the call: patient, provider Patient did not have a visit in the prior 7 days to address this/these issue(s).   History of Present Illness: Patient has history of hypertension, obesity, fatty liver, type 2 diabetes with neuropathy, dyslipidemia, chronic pain syndrome followed by pain management.  He has osteoarthritis with prior history of left total knee replacement.  Blood sugars relatively stable.  Last A1c was 8.0% back in November.  Recent fasting blood sugars fairly consistently 115 to 130.  He is currently on basaglar 60 units twice daily and takes NovoLog 3 times daily per sliding scale.  Patient states that when he was taking Lantus his blood sugars were much better controlled.  He states his insurance will now cover Lantus and he would like to switch from Basaglar back to Lantus.  No recent hypoglycemia symptoms.  History of mild transaminase elevations.  Previous ultrasound showed fatty liver changes.  He does take some simvastatin.  He is on amlodipine for hypertension as well as  Toprol-XL.  Has history of acute pancreatitis.  He is not a good candidate for GLP-1 medication.  Past Medical History:  Diagnosis Date  . Arthritis    oa--and ra.  s/p right knee replacement on Jan 28, 2011--plans left knee replacement on 03/04/11.  also severe lower back pain-previous lumbar fusions, also pain in both hips, shoulders   . Asthma   . Diabetes mellitus   . Gallstone pancreatitis   . GERD (gastroesophageal reflux disease)   . Hepatitis    pancreatitis 2010  . Hyperlipidemia   . Hypertension    eccho 6/10, EKG 1/11, clearence Dr Caryl Never from 11/28/10 on chart  . Peripheral vascular disease (HCC)    states after arthroscopy left and right was given blood thinners- had symptoms of blood clot but wasnt diagnosed  . Pneumonia    2010  . Sleep apnea    study years ago/ no cpap/ states mild   Past Surgical History:  Procedure Laterality Date  . BACK SURGERY     hx of multiple back surgeries including lumbar fusion S1-L3  . CHOLECYSTECTOMY    . COLON SURGERY     removal of part of small intestine 1979  . ELBOW SURGERY     bilateral  . SHOULDER SURGERY     bilateral   . TOTAL KNEE ARTHROPLASTY  01/28/2011   Procedure: TOTAL KNEE ARTHROPLASTY;  Surgeon: Shelda Pal;  Location: [facility];  Service: Orthopedics;  Laterality: Right;  femoral nerve block preop.  Marland Kitchen TOTAL KNEE ARTHROPLASTY  03/04/2011   Procedure: TOTAL KNEE ARTHROPLASTY;  Surgeon: Shelda Pal;  Location: [facility];  Service: Orthopedics;  Laterality: Left;  . TUBAL LIGATION      reports that he has quit smoking. His smoking use included cigars. He quit smokeless tobacco use about 27 years ago. He reports that he does not drink alcohol or use drugs. family history is not on file. Allergies  Allergen Reactions  . Cortisone Itching    Hives and causes blood sugar to elevate      Observations/Objective: Patient sounds cheerful and well on the phone. I do not appreciate any SOB. Speech and thought  processing are grossly intact. Patient reported vitals:  Assessment and Plan:  #1 type 2 diabetes which is insulin-dependent.  History of recent suboptimal control  -Switch back to Lantus per his request 60 units twice daily -Continue sliding scale NovoLog 3 times daily with meals -Bring back for 1232-month follow-up.  A1c then.  Review sliding scale at that time  #2 elevated transaminases.  Probably related to fatty liver changes -Repeat hepatic panel at follow-up  #3 hypertension stable  #4 dyslipidemia  Follow Up Instructions:  -3 months  I did not refer this patient for an OV in the next 24 hours for this/these issue(s).  I discussed the assessment and treatment plan with the patient. The patient was provided an opportunity to ask questions and all were answered. The patient agreed with the plan and demonstrated an understanding of the instructions.   The patient was advised to call back or seek an in-person evaluation if the symptoms worsen or if the condition fails to improve as anticipated.  I provided 25 minutes of non-face-to-face time during this encounter.   Evelena PeatBruce Burchette, MD

## 2018-07-28 ENCOUNTER — Other Ambulatory Visit: Payer: Self-pay | Admitting: Pharmacist

## 2018-07-28 ENCOUNTER — Telehealth: Payer: Self-pay | Admitting: Family Medicine

## 2018-07-28 NOTE — Telephone Encounter (Signed)
Called [name] and she spoke to a Mrs. Leavy Cella with [facility] and they are going to apply for patient assistance for Basaglar and Humalog. We should be getting a form to fill out.

## 2018-07-28 NOTE — Telephone Encounter (Signed)
Patient's wife requesting call back from [facility] regarding this medication.

## 2018-07-28 NOTE — Telephone Encounter (Signed)
Called patients wife and let her know that she can price check at different pharmacies and also use GoodRx to see if they can get for a better price. Also advised them to call his insurance and see what insulin is covered by his insurance company. Also advised fort them to apply for patient assistance if they need it. We discussed rotating injections sites. Wife verbalized an understanding and will let us know if patient needs a different insulin.

## 2018-07-28 NOTE — Telephone Encounter (Signed)
LMVM for the patient to call back and schedule a 3 month follow up appointment.

## 2018-07-28 NOTE — Telephone Encounter (Signed)
Copied from CRM [id number]. Topic: Quick Communication - Rx Refill/Question >> Jul 28, 2018 12:33 PM Maia Petties wrote: Medication: insulin glargine (LANTUS) 100 unit/mL SOPN is over $200 per month with insurance - Pt is wanting to go back to the Insulin Glargine (BASAGLAR KWIKPEN) 100 UNIT/ML SOPN if possible (last time Basaglar required PA). Please advise.   Has the patient contacted their pharmacy? yes Preferred Pharmacy (with phone number or street name): [id number] - [place], [place] - [address] AT [facility] [address] [phone] (Phone) [phone] (Fax)

## 2018-07-29 ENCOUNTER — Other Ambulatory Visit: Payer: Self-pay | Admitting: Pharmacy Technician

## 2018-07-29 NOTE — Patient Outreach (Addendum)
[facility]) Care Management  [facility] CM Pharmacy   07/29/2018  [name] [birth year] [id number]  Reason for referral: medication assistance  Referral source: [facility] RN Referral medication(s): Lantus and Novolog Current insurance:  [employer]  HPI:  Iron deficiency anemia, erectile dysfunction, hypertension, hyperlipidemia, obesity, and type 2 diabetes.  Objective: Allergies  Allergen Reactions  . Cortisone Itching    Hives and causes blood sugar to elevate    Medications Reviewed Today    Reviewed by [name], [facility] (Pharmacist) on 07/28/18 at 1438  Med List Status: <None>  Medication Order Taking? Sig Documenting Provider Last Dose Status Informant  amLODipine (NORVASC) 10 MG tablet [id number] Yes Take 1 tablet (10 mg total) by mouth daily. Kristian Covey, MD Taking Active   azelastine (ASTELIN) 0.1 % nasal spray [id number] Yes USE 2 SPRAYS IN EACH NOSTRIL TWICE A DAY AS NEEDED FOR ALLERGIES Burchette, Elberta Fortis, MD Taking Active   docusate sodium (COLACE) 100 MG capsule [id number] Yes Take 100 mg by mouth 3 (three) times daily as needed. Constipation  [provider] Taking Active Spouse/Significant Other  fexofenadine-pseudoephedrine (ALLEGRA-D 12 HOUR) 60-120 MG per tablet [id number] Yes Take 1 tablet by mouth 2 (two) times daily. Burchette, Elberta Fortis, MD Taking Active   glucose blood (ONE TOUCH ULTRA TEST) test strip [id number] Yes USE TO CHECK BLOOD SUGAR IN THE AM AND PM AS DIRECTED.  Dx E11.9 Kristian Covey, MD Taking Active   HYDROcodone-acetaminophen [facility]) 10-325 MG per tablet [id number] Yes Take 2 tablets every 8-12 hours [provider] Taking Active   insulin glargine (LANTUS) 100 unit/mL SOPN [id number] Yes Inject 60 units subcutaneous twice daily Kristian Covey, MD Taking Active   Insulin Pen Needle 31G X 5 MM MISC [id number] Yes Use as directed with Basaglar Leatha Gilding, MD Taking Active   Insulin  Syringe-Needle U-100 (BD INSULIN SYRINGE ULTRAFINE) 31G X 5/16" 0.5 ML MISC [id number] Yes Use daily as directed Kristian Covey, MD Taking Active   lisinopril (PRINIVIL,ZESTRIL) 40 MG tablet [id number] Yes TAKE 1 TABLET BY MOUTH EVERY MORNING Burchette, Elberta Fortis, MD Taking Active   methocarbamol (ROBAXIN) 500 MG tablet [id number] Yes Take 500 mg by mouth every 8 (eight) hours as needed. [provider] Taking Active   metoprolol succinate (TOPROL-XL) 100 MG 24 hr tablet [id number] Yes TAKE 1 TABLET BY MOUTH EVERY MORNING Burchette, Elberta Fortis, MD Taking Active   morphine (MS CONTIN) 30 MG 12 hr tablet [id number] Yes TAKE 1 TABLET EVERY 12 HOURS AS DIRECTED [provider] Taking Active   NOVOLOG 100 UNIT/ML injection [id number] Yes INJECT 3 UNITS 3 TIMES A DAY **ALSO USE SLIDING SCALE** Burchette, Elberta Fortis, MD Taking Active   polyethylene glycol [facility] / GLYCOLAX) packet [id number] Yes Take 17 g by mouth daily as needed. Constipation  [provider] Taking Active Spouse/Significant Other  pregabalin (LYRICA) 50 MG capsule [id number] Yes TAKE 1 CAPSULE BY MOUTH EVERYDAY AT BEDTIME PRN [provider] Taking Active   simvastatin (ZOCOR) 40 MG tablet [id number] Yes TAKE 1 TABLET BY MOUTH EVERY DAY Burchette, Elberta Fortis, MD Taking Active           Assessment:  Drugs sorted by system:  Neurology: Pregabalin  Cardiovascular: Amlodipine, Lisinopril, Metoprolol Succinate, Simvastatin  Pulmonary/Allergy: Azelastine, Fexofenadine-Pseudoephedrine,   Gastrointestinal: Docusate Sodium, Polyethylene Glycol,  Endocrine: Lantus, Novolog  Pain: Hydrocodone-Acetaminophen, Methocarbamol, Morphine ER  Medication Review Findings:  Dose to high-Simvastatin-Amlodipine combination increases patient's risk  of myopathy. Plasma concentrations and pharmacologic effects of simvastatin may be increased by amlodipine. The daily dose of simvastatin should not exceed 20 mg in patients  receiving amlodipine according to official package labeling.  -Patient is currently taking Simvastain 40 mg.    -May consider decreasing the dose or switching to another statin.   HgA1c-8% in November of 2019, new labs needed after COVID. Insulin regimen may need to be intensified or an addition agent if we can utilize patient assistance.    Medication Assistance Findings:  Medication assistance needs identified: Patient's wife reported the patient is already in the donut hole.     HTA reported the following:   OOP = $771.87 TDS= $5,093.26     He currently has 8 pens of Basaglar left (they had issues getting the Lantus filled as patient is very close to entering the donut hole) ---this is what he was taking before being switched to Lantus due to formulary issues.  Patient's wife reported the patient already tried [place] and Levemir but those products did not work for him.   Patient's PCP will be asked to switch the patient back to Basaglar and to change the Novolog to Humalog.    Patient will be sent a Solicitor for [employer] and Humalog.  I  If he runs low on insulin, the [name] will be called.   Additional medication assistance options reviewed with patient as warranted:  No other options identified  Plan: I will route patient assistance letter to [facility] pharmacy technician who will coordinate patient assistance program application process for medications listed above.  [facility] pharmacy technician will assist with obtaining all required documents from both patient and provider(s) and submit application(s) once completed.    Follow up with the patient in 3-4 weeks.  [name], PharmD, BCACP [facility] Clinical Pharmacist [phone]

## 2018-07-29 NOTE — Patient Outreach (Signed)
[facility]) Care Management  07/29/2018  [name] [birth year] [id number]                          Medication Assistance Referral  Referral From: [facility] Katina B.  Medication/Company: [name] Patient application portion:  Mailed Provider application portion: Faxed  to Dr. Evelena Peat  Medication/Company: Humalog / lilly Patient application portion:  Mailed Provider application portion: Faxed  to Bruce Burchette   Follow up:  Will follow up with patient in 5-10 business days to confirm application(s) have been received.  Erla Bacchi P. Virtie Bungert, CPhT Musician Care Management [phone]

## 2018-08-09 ENCOUNTER — Other Ambulatory Visit: Payer: Self-pay | Admitting: Pharmacy Technician

## 2018-08-09 NOTE — Patient Outreach (Signed)
[facility]) Care Management  08/09/2018  [name] 07/17/1950 [id number]   Unsuccessful outreach call placed to patient in regards to [name] application for Basaglar and Humalog.   Unfortunately patient did not answer the phone, HIPAA compliant voicemail left.  Was calling to patient to inquire if he has received the applications that were mailed to him on 07/29/2018.  Will followup with patient in 3-5 business days.  [name] P. [name], CPhT Musician Care Management [phone]

## 2018-08-10 ENCOUNTER — Other Ambulatory Visit: Payer: Self-pay | Admitting: Pharmacy Technician

## 2018-08-10 ENCOUNTER — Ambulatory Visit: Payer: Self-pay | Admitting: Pharmacist

## 2018-08-10 NOTE — Patient Outreach (Signed)
[facility]) Care Management  08/10/2018  [name] 1950-11-03 [id number]    Incoming call received from patient's wife [name], HIPAA identifiers verified.  Patient's wife informed they had sent the [facility] patient assistance  applications in last week for Basaglar and Humalog.  Will followup with patient in 10-14 business days if not returned.  Shean Gerding P. Matson Welch, CPhT Musician Care Management [phone]

## 2018-08-18 ENCOUNTER — Other Ambulatory Visit: Payer: Self-pay | Admitting: *Deleted

## 2018-08-18 NOTE — Patient Outreach (Signed)
[facility]) Care Management  08/18/2018  ESSAM PARKMAN 09-03-1950 [id number]   HTA HRA Telephone Screen     Outreach Attempt:  Successful telephone outreach to patient in the month of April to complete Health Risk Assessment Screening.  HIPAA verified with patient.  Patient reporting history of hypertension, diabetes, and multiple falls at home (2 in the past 6 months).  States his blood pressure is pretty well controlled but elevated when he is in a lot of pain.  Ambulates with a cane with wife assisting with ADLs and IADLs.  Last Hgb A1C was 8 and does not monitor his blood sugars on a daily basis although he is on insulin.  Patient and wife reporting he is having trouble affording insulin pens Psychologist, sport and exercise) which is about $100 a month.  [facility] services reviewed and discussed.  Patient and wife declines services of [facility] Nursing and [facility] Social Work but does verbally agree to [facility] assistance for affording medications.   Plan:  Patient will be placed in a Health Risk Assessment Engaged Program with 3 month follow up for verification of further needs and to offer [facility] services.  RN Health Coach placed [facility] referral for medication affordability assistance.  RN Health Coach sent Successful Outreach Letter. RN Health Coach will send [facility] Welcome Letter.  RN Health Coach will make next telephone outreach to patient for follow up in the month of July.  Rhae Lerner RN [facility] Care Management  RN Health Coach [phone] [email or web address]

## 2018-08-19 ENCOUNTER — Other Ambulatory Visit: Payer: Self-pay | Admitting: Pharmacy Technician

## 2018-08-19 NOTE — Patient Outreach (Signed)
[facility]) Care Management  08/19/2018  SANDON BUZZELL 11-Oct-1950 [id number]    Received all necessary documents and signatures from both patient and provider for [name] patient assistance for Basaglar and Humalog.  Submitted completed application via fax.  Will followup with [name] in 5-7 business days to inquire on status of application.  Chidubem Chaires P. Genavieve Mangiapane, CPhT Musician Care Management [phone]

## 2018-08-20 ENCOUNTER — Other Ambulatory Visit: Payer: Self-pay | Admitting: Pharmacy Technician

## 2018-08-20 NOTE — Patient Outreach (Signed)
[facility]) Care Management  08/20/2018  [name] May 02, 1950 [id number]    Incoming call received from patient's wfie, HIPAA identifiers verified.  Patient's wife was calling to inquire about an update on the application process.  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 Informed her I would followup with the company next week and be in touch with her. She verbalized understanding.  [name] P. [name], CPhT Musician Care Management [phone]

## 2018-08-23 ENCOUNTER — Other Ambulatory Visit: Payer: Self-pay | Admitting: Pharmacy Technician

## 2018-08-23 NOTE — Patient Outreach (Signed)
[facility]) Care Management  08/23/2018  [name] Dec 18, 1950 [id number]   Care coordination call placed to [name] in regards to patient's application for Humalog and Basaglar.  Spoke to [place] who informed that they received the application and it appears that they have everything they need to process the application. However, the application has not been processed yet. He informed to give it a couple more days.  Will followup with [name] in 3-5 business days to inquire on status of application.  [name] P. [name], CPhT Musician Care Management [phone]

## 2018-08-26 ENCOUNTER — Other Ambulatory Visit: Payer: Self-pay | Admitting: Pharmacy Technician

## 2018-08-26 NOTE — Patient Outreach (Signed)
[facility]) Care Management  08/26/2018  [name] 11-11-1950 [id number]   Care coordination call placed to [name] in regards to patient's application for Basaglar and Humalog.  Spoke to [name] who informed the application was received on 08/19/2018 but is in the que to be processed. She informed she would send an email to the processing team to ask for them to process the application. She informed this could take 2-3 business days to complete possibly sooner.  Will followup with [name] in 2-3 business days to inquire on status of application.  [name] P. [name], CPhT Musician Care Management [phone]

## 2018-08-30 ENCOUNTER — Other Ambulatory Visit: Payer: Self-pay | Admitting: Pharmacy Technician

## 2018-08-30 NOTE — Patient Outreach (Signed)
[place] [facility]) Care Management  08/30/2018  KHADIR ROAM September 29, 1950 [id number]  Care coordination call placed to [place] in regards to patient's application for Humalog and Basaglar.  Spoke to Alinda Sierras who informed patient had been APPROVED 08/26/2018-03/24/2019. Alinda Sierras informed an order had been placed but the patient needed to call in to set up delivery.  Will outreach patient of this information.  Haruto Demaria P. Patrycja Mumpower, [place] Management [phone]

## 2018-08-30 NOTE — Patient Outreach (Signed)
[facility]) Care Management  08/30/2018  [name] Feb 15, 1951 [id number]   Unsuccessful outreach call placed to patient in regards to Humalog and Basaglar application with [name].  Unfortunately patient did not answer the phone, HIPAA compliant voicemail left.  Was calling patient to inform that she needed to call into [name] cares at [phone] to set up a delivery date for the medication.  Will attempt another outreach in 2-3 business days.  Raechell Singleton P. Vian Fluegel, [place] Management [phone]

## 2018-09-01 ENCOUNTER — Other Ambulatory Visit: Payer: Self-pay | Admitting: Pharmacy Technician

## 2018-09-01 NOTE — Patient Outreach (Signed)
[place] [facility]) Care Management  09/01/2018  EMIGDIO WILDEMAN 09/01/1950 [id number]   Unsuccessful outreach call placed to patient in regards to [place] application for Basaglar and Humalog.  Unfortunately patient did not answer the phone, HIPAA compliant voicemail left.  Was calling to inquire if patient had contacted [name] to set up delivery of the medication.  Will followup with patient in 5-7 business days if call is not returned.  Elasha Tess P. Esmee Fallaw, [place] Management [phone]

## 2018-09-01 NOTE — Patient Outreach (Signed)
[place] [facility]) Care Management  09/01/2018  [name] June 07, 1950 [id number]   Care coordination call placed to [place] in regards to patient's Basaglar and Humalog application.  Spoke to [place] who informed a script is needed.  Sent an inbasket to [place] at Dr. Erick Blinks office to inform her that a script needs to be sent in to [employer].  Will followup about delivery in 5-7 business days.  Caci Orren P. Herbie Lehrmann, [place] Management [phone]

## 2018-09-02 IMAGING — MR MR ELBOW*L* W/O CM
5 series · 40 of 40 positions shown · non-contrast
Comparison: Left elbow x-rays dated July 31, 2017.

CLINICAL DATA: Lateral elbow pain for the past month.

EXAM:
MRI OF THE LEFT ELBOW WITHOUT CONTRAST
TECHNIQUE: Multiplanar, multisequence MR imaging of the elbow was performed. No
intravenous contrast was administered.

[Series 4: T1 · axial · left · 3.0mm · 0.47mm/px · z∈[-153,-28]mm · 10 of 40 slices shown]
[im 1/40]
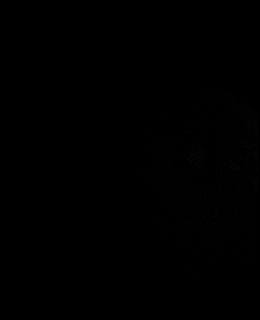
[im 5/40]
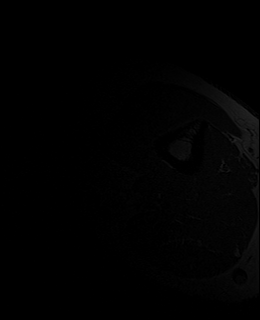
[im 9/40]
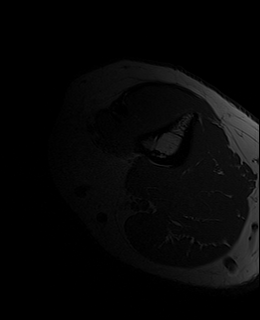
[im 14/40]
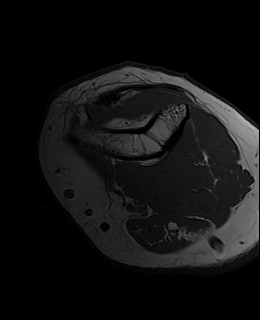
[im 18/40]
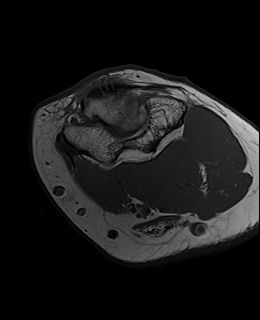
[im 22/40]
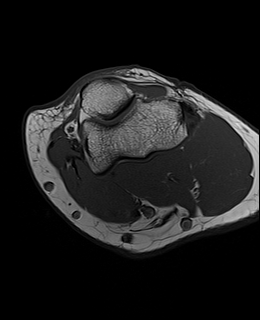
[im 27/40]
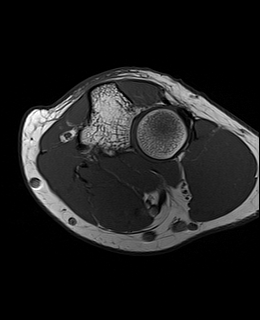
[im 31/40]
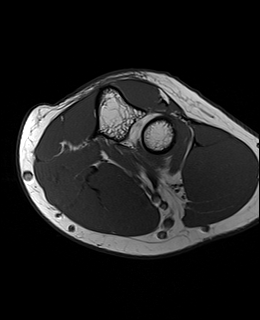
[im 35/40]
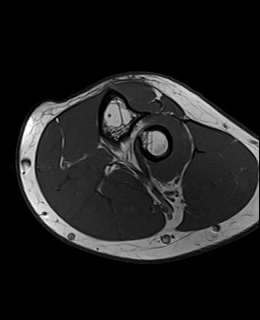
[im 40/40]
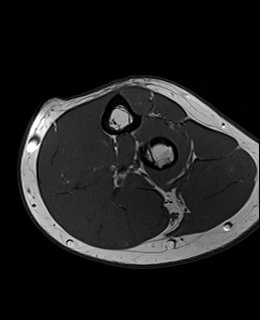

[Series 5: T2 fat-sat · axial · left · 3.0mm · 0.44mm/px · z∈[-153,-27]mm · 10 of 40 slices shown (1 of 2)]
[im 1/40]
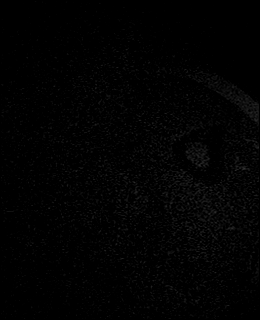
[im 5/40]
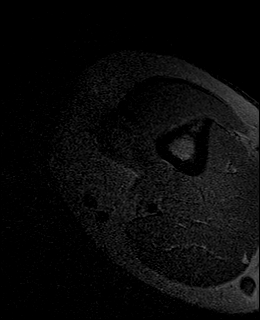
[im 9/40]
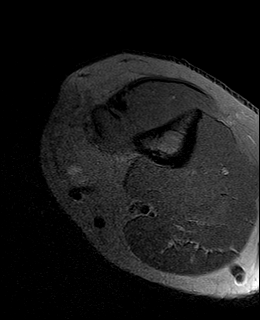
[im 14/40]
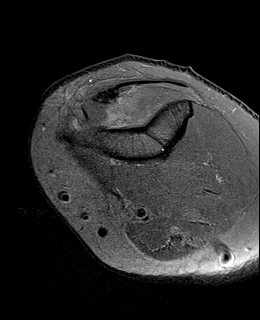
[im 18/40]
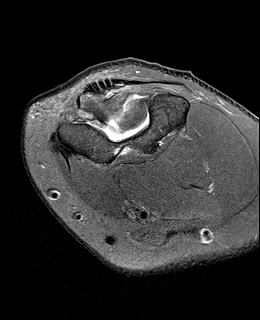
[im 22/40]
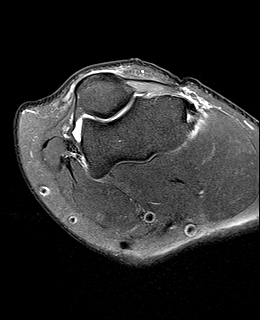
[im 27/40]
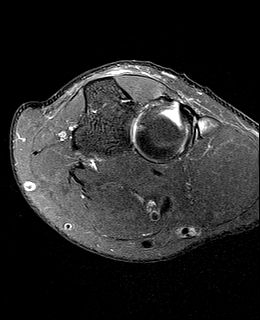
[im 31/40]
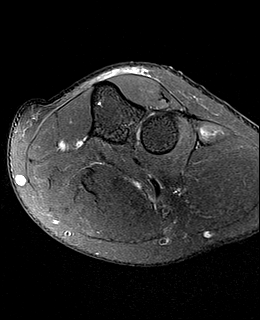
[im 35/40]
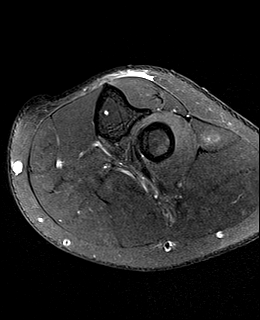
[im 40/40]
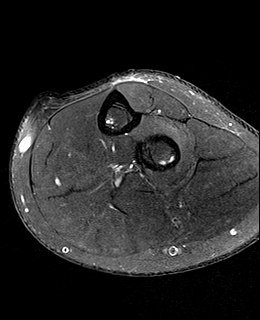

[Series 6: T2 fat-sat · coronal · left · 3.0mm · 0.50mm/px · 7 of 28 slices shown (2 of 2)]
[im 1/28]
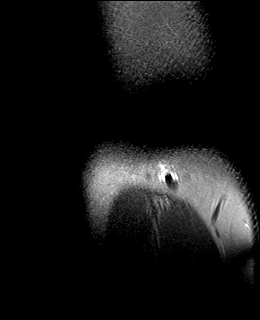
[im 5/28]
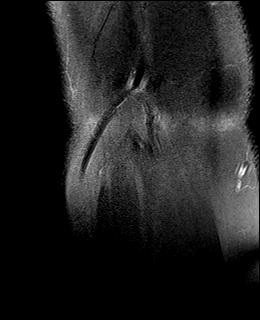
[im 10/28]
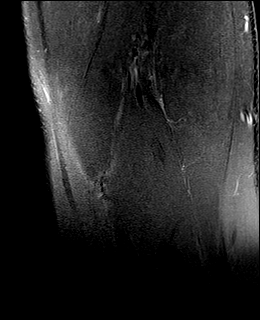
[im 14/28]
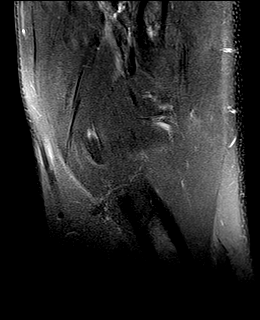
[im 19/28]
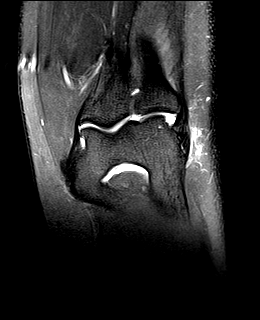
[im 23/28]
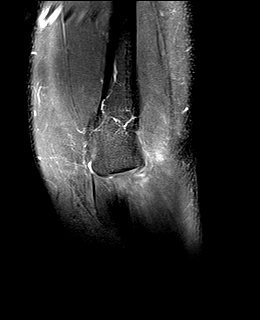
[im 28/28]
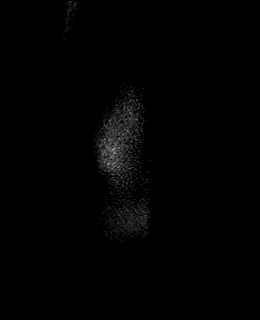

[Series 7: PD fat-sat · sagittal · left · 3.0mm · 0.44mm/px · 7 of 30 slices shown]
[im 1/30]
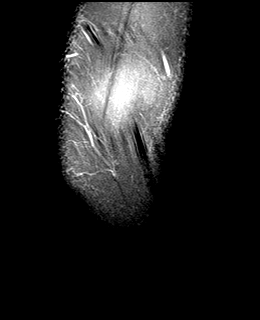
[im 5/30]
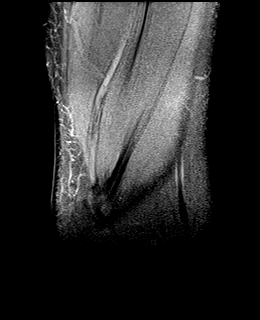
[im 10/30]
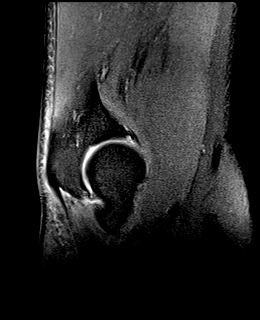
[im 15/30]
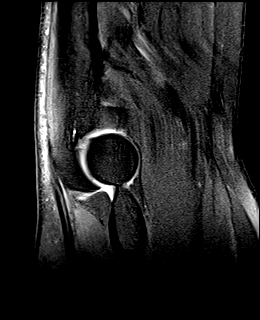
[im 20/30]
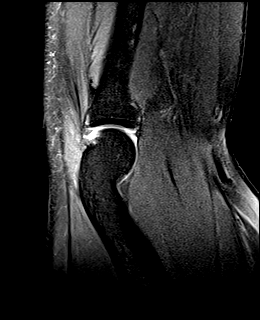
[im 25/30]
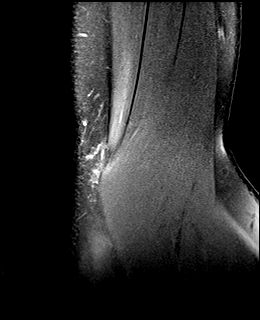
[im 30/30]
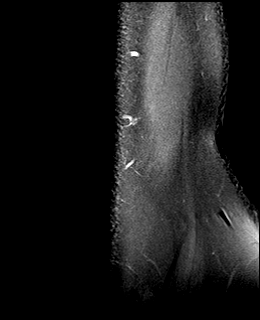

[Series 8: STIR · coronal · left · 3.0mm · 0.55mm/px · 6 of 25 slices shown]
[im 1/25]
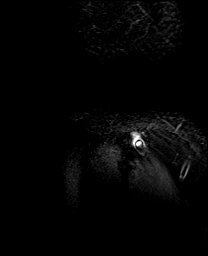
[im 5/25]
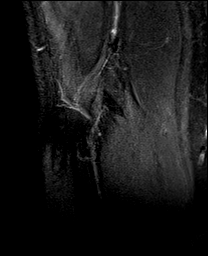
[im 10/25]
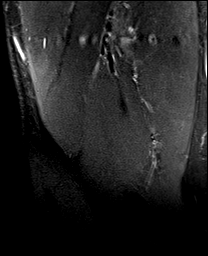
[im 15/25]
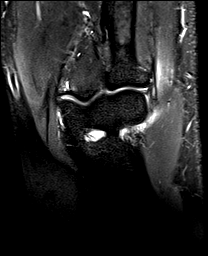
[im 20/25]
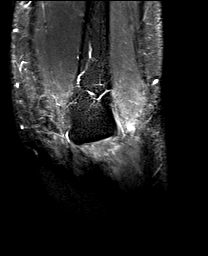
[im 25/25]
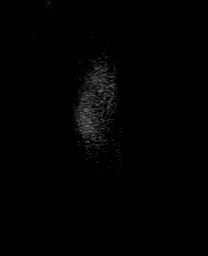

[40 of 40 positions shown; findings below may reference images not displayed]

FINDINGS: TENDONS

Common forearm flexor origin: Intact with normal signal.

Common forearm extensor origin: Partial tear of the common forearm
extensor tendon origin with mild surrounding soft tissue edema.

Biceps: Intact.

Triceps: Intact with normal signal.

LIGAMENTS

Medial stabilizers: Intact.

Lateral stabilizers: The lateral ulnar and radial collateral
ligaments appear intact.

Cartilage: Preserved.  No focal chondral defect demonstrated.

Joint: No joint effusion or loose body observed.

Cubital tunnel: Unremarkable.  The ulnar nerve appears normal.

Bones: No acute or significant extra-articular osseous findings.

Other: Mild edema in the proximal extensor digitorum muscle.
IMPRESSION: 1. Partial tear of the common forearm extensor tendon with mild
strain of the proximal extensor digitorum muscle.

## 2018-09-06 ENCOUNTER — Other Ambulatory Visit: Payer: Self-pay | Admitting: Pharmacy Technician

## 2018-09-06 ENCOUNTER — Telehealth: Payer: Self-pay | Admitting: Family Medicine

## 2018-09-06 NOTE — Patient Outreach (Signed)
[place] [facility]) Care Management  09/06/2018  KENZEL RUESCH [birth year] [id number]    Care coordination call placed to [place], the pharmacy for [facility], in regards to the delivery status of patient's Humalog and Basaglar.  Spoke to [place] who informed the patient was enrolled for both medications through the [employer].  She informed they received the original scripts that were sent in, however the pharmacist at [facility] had questions about the prescriptions. Inquired what clarifications were needed and Andria Frames informed that the note did not specify that information.  Andria Frames suggested that the provider or provider's nurse call into [facility] at [phone] and give verbal orders for both the Basaglar and Humalog to avoid the same scripts that need clarifying from being refaxed and thus delaying the process.  Will route note to Dr. Erick Blinks office to inquire if they can assist with this matter.  Armas Mcbee P. Kiefer Opheim, [place] Management [phone]

## 2018-09-06 NOTE — Telephone Encounter (Signed)
Copied from [place] [id number]. Topic: Quick Communication - Rx Refill/Question >> Sep 06, 2018 10:26 AM Reyne Dumas L wrote: Medication:  Pt's wife Romie Minus calling.  States pt is switching from NOVOLOG 100 UNIT/ML injection to Humalog.  They get this through [facility] and the organization needs additional information to make this happen. [employer] number is [phone]. Romie Minus can be reached at [phone].

## 2018-09-06 NOTE — Patient Outreach (Signed)
[place] [facility]) Care Management  09/06/2018  [name] April 13, 1950 [id number]   Care coordination call placed to [place] in regards to patient's applicaiton for Basaglar and Humalog.  Spoke to [place] who said patient was enrolled for both medications but she would have to call over to [facility] about shipment. [name] informed that she spoke to [facility] who informed a script needed to be sent in. Informed [name] we would send it in again but it has been sent in multiple times. [name] apologized for the inconvenience and provided me with the fax number [phone].  Will fax script when in office on 09/07/2018.  [name] P. Jamaine Quintin, [place] Management [phone]

## 2018-09-07 ENCOUNTER — Ambulatory Visit: Payer: Self-pay | Admitting: Pharmacist

## 2018-09-09 ENCOUNTER — Other Ambulatory Visit: Payer: Self-pay | Admitting: Pharmacy Technician

## 2018-09-09 NOTE — Telephone Encounter (Signed)
[name] called and stated that [name] was approved for the [facility] Patient Assistance Program for Basaglar and Humalog, However they need to speak with the provider due to having questions about the order and the way the Rx was written. Please call [phone] asap. [name] is low on medication and as soon as this is clarified, meds can be shipped out to the [name].

## 2018-09-09 NOTE — Patient Outreach (Signed)
[place] [facility]) Care Management  09/09/2018  TAKAO LIZER 06-06-1950 [id number]    Unsuccessful call placed to patient inr [place]ards to [name] application for Basaglar and Humalog.  Unfortunately patient did not answer the phone, HIPAA compliant voicemail left.  Was calling patient to inform that the provider's office needs to followup with [facility] about the medications before they can be shipped.  Lilymae Swiech P. Chaseton Yepiz, [place] Management [phone]

## 2018-09-09 NOTE — Patient Outreach (Signed)
[place] [facility]) Care Management  09/09/2018  [name] December 06, 1950 [id number]    ADDENDUM  Incoming call received from patient's wife in regards to [place] application for [place] and Humalog.   Spoke to patient's wife [name], HIPAA identifiers verified.  Informed [name] that we are waiting on the provider to call into [name] to clarify the orders that he wrote on the application. Informed her that as soon as this was complete then patient could schedule delivery. Patient's wife [name] verbalized understanding.  Sumiko Ceasar P. Jalecia Leon, [place] Management [phone]

## 2018-09-09 NOTE — Patient Outreach (Signed)
[place] [facility]) Care Management  09/09/2018  [name] July 03, 1950 [id number]    Care coordination call placed to [facility] in regards to patient's application for Basaglar and Humalog.  Spoke to [place] who informed the orders need clarification before they can be shipped.   Will call provider's office and patient to inquire if they can help with this matter.  Care coordination call placed to Dr Burchette's office in regards to the orders needing clarification. Spoke to [place] who informed she would send a message back to the nurses.  Once the clarification has been received at [facility] then the medication can be shipped to the patient.  [name], [name] Management [phone]

## 2018-09-13 NOTE — Telephone Encounter (Signed)
I called the number for [employer] and spoke to a pharmacist Anderson [place] and she stated that she needed clarification on the dosages for both Basaglar and Humalog and she wanted to make sure that Basaglar was 60 units TWICE daily and that the max dosage of Humalog would be 60 units per day.  Is this correct?

## 2018-09-14 NOTE — Telephone Encounter (Signed)
yes

## 2018-09-14 NOTE — Telephone Encounter (Signed)
Yes  He does take Basaglar 60 units bid.  He has been on sliding scale for his Humalog

## 2018-09-14 NOTE — Telephone Encounter (Signed)
Is his max dose of Humalog 60 units also? They need to know

## 2018-09-15 ENCOUNTER — Other Ambulatory Visit: Payer: Self-pay | Admitting: Pharmacy Technician

## 2018-09-15 NOTE — Patient Outreach (Signed)
[place] [facility]) Care Management  09/15/2018  KEYONTAY STOLZ [birth year] [id number]   Unsuccessful outreach call placed to patient in regards to [place] application for Humalog and [place].  Unfortunately patient did not answer the phone, HIPAA compliant voicemail left.  Was calling patient to inform they need to call into [name] to set up delivery.  Will followup with patient in 3-7 business days to inquire if medication was received and/or to inquire if they called to set up delivery.  Shaindy Reader P. Matthieu Loftus, [place] Management [phone]

## 2018-09-15 NOTE — Telephone Encounter (Signed)
[employer] and was on hold for over 30 mins and spoke to [place] who transferred me to [place] in the pharmacy and gave her the mas unit dosage of 60 units daily for Humalog and confirmed the Basaglar is 60 units bid. Renee verbalized an understanding.

## 2018-09-15 NOTE — Patient Outreach (Signed)
[place] [facility]) Care Management  09/15/2018  AMERICO VALLERY 10-10-1950 [id number]   Care coordination call placed to [name] in regards to patient's application for Humalog and Basaglar.  Spoke to [place] who informed that they had received clarifications of the 2 orders and that the orders were ready for shipment. She informed that they would reach out to patient but that if patient was in need of medication then it would be faster for the patient to call in to set up delivery.  Will reach out to patient about delivery.  Jhett Fretwell P. Larcenia Holaday, [place] Management [phone]

## 2018-09-17 ENCOUNTER — Other Ambulatory Visit: Payer: Self-pay | Admitting: *Deleted

## 2018-09-17 DIAGNOSIS — M961 Postlaminectomy syndrome, not elsewhere classified: Secondary | ICD-10-CM | POA: Diagnosis not present

## 2018-09-17 DIAGNOSIS — G894 Chronic pain syndrome: Secondary | ICD-10-CM | POA: Diagnosis not present

## 2018-09-17 DIAGNOSIS — Z79891 Long term (current) use of opiate analgesic: Secondary | ICD-10-CM | POA: Diagnosis not present

## 2018-09-17 DIAGNOSIS — S53402D Unspecified sprain of left elbow, subsequent encounter: Secondary | ICD-10-CM | POA: Diagnosis not present

## 2018-09-17 NOTE — Patient Outreach (Signed)
[facility]) Care Management  09/17/2018  [name] 20-Apr-1950 [id number]   Case Closure/Transition to [facility]   Outreach:  Patient case has been transitioned to [facility]I for further Care Management assistance.  [place] continue to work with patient for medication assistance.  Plan: RN Health Coach will close case at this time. RN Health Coach will send primary care provider Care Management Case Closure Letter. RN Health Coach will leave patient active with [place] Management at this time while [place] is working with patient.  [place] Coach [phone] [name]

## 2018-09-21 ENCOUNTER — Other Ambulatory Visit: Payer: Self-pay | Admitting: Family Medicine

## 2018-09-22 ENCOUNTER — Other Ambulatory Visit: Payer: Self-pay | Admitting: Pharmacy Technician

## 2018-09-22 NOTE — Patient Outreach (Signed)
[place] [facility]) Care Management  09/22/2018  PHILIPE LASWELL Jul 23, 1950 [id number]    Unsuccessful outreach call placed to patient in regards to [place] application for Basaglar and Humalog.  Unfortunately patient did not answer the phone, HIPAA compliant voicemail left.  Was calling patient to inquire if he has received the medication.  Will followup in 5-7 business days if call is not returned.  Adyn Serna P. Kysean Sweet, [place] Management [phone]

## 2018-09-28 ENCOUNTER — Other Ambulatory Visit: Payer: Self-pay | Admitting: Pharmacy Technician

## 2018-09-28 NOTE — Patient Outreach (Signed)
[place] [facility]) Care Management  09/28/2018  [name] 1950-11-23 [id number]    Unsuccessful outreach call placed to patient in regards to [place] patient assistance for Basaglar and Humalog.  Unfortunately patient did not answer the phone, HIPAA compliant voicemail left.  Was calling patient to inquire if medication was received.  Will followup with patient in 5-10 business days if call is not returned.  Marquie Aderhold P. Neeya Prigmore, [place] Management [phone]

## 2018-09-29 ENCOUNTER — Other Ambulatory Visit: Payer: Self-pay | Admitting: Pharmacy Technician

## 2018-09-29 NOTE — Patient Outreach (Signed)
[place] [facility] Care Management  09/29/2018  [name] 1951-01-04 [id number]  Incoming call received from patient's wife, HIPAA identifiers verified.  Patient's wife was returning my call. She informed that patient did received his [name] care medications Basaglar and Humalog. She informed patient received 8 boxes of Basaglar and 4 boxes of Humalog.  Discussed refill procedure with patient's wife. Patient is set up for automatic refills but informed patient's wife that if patient only has a 2 week supply left and  they have not heard from the company, then they will need to contact them. Informed patient's wife where to find that number. Patient's wife verbalized understanding.  Patient's wife denied having any other questions or concerns at this time related to the medications and the patient assistance process. Confirmed patient's wife had name and number.  Will route note to [place] that patient assistance has been completed and will remove myself from care team.  Luiz Ochoa. [name], [place] Management [phone]

## 2018-10-01 ENCOUNTER — Telehealth: Payer: Self-pay | Admitting: Pharmacist

## 2018-10-01 NOTE — Patient Outreach (Addendum)
[place] [facility]) Care Management  10/01/2018  [name] May 10, 1950 [id number]   Patient's case is being closed because he received Basaglar and Humalog from [place].  Patient's wife communicated understanding to [place], Susy Frizzle, about ordering refills.  Plan: Close patient's pharmacy case. Send letters to patient and his provider.   Elayne Guerin, PharmD, [place] Clinical Pharmacist [phone]

## 2018-10-03 ENCOUNTER — Other Ambulatory Visit: Payer: Self-pay | Admitting: Family Medicine

## 2018-10-12 ENCOUNTER — Ambulatory Visit: Payer: PPO | Admitting: *Deleted

## 2018-10-19 ENCOUNTER — Ambulatory Visit: Payer: Self-pay | Admitting: Pharmacist

## 2018-10-26 ENCOUNTER — Ambulatory Visit: Payer: PPO | Admitting: Family Medicine

## 2018-11-12 DIAGNOSIS — S53402D Unspecified sprain of left elbow, subsequent encounter: Secondary | ICD-10-CM | POA: Diagnosis not present

## 2018-11-12 DIAGNOSIS — Z79891 Long term (current) use of opiate analgesic: Secondary | ICD-10-CM | POA: Diagnosis not present

## 2018-11-12 DIAGNOSIS — M961 Postlaminectomy syndrome, not elsewhere classified: Secondary | ICD-10-CM | POA: Diagnosis not present

## 2018-11-12 DIAGNOSIS — G894 Chronic pain syndrome: Secondary | ICD-10-CM | POA: Diagnosis not present

## 2018-12-03 ENCOUNTER — Emergency Department (HOSPITAL_COMMUNITY): Payer: PPO

## 2018-12-03 ENCOUNTER — Telehealth: Payer: Self-pay | Admitting: *Deleted

## 2018-12-03 ENCOUNTER — Other Ambulatory Visit: Payer: Self-pay

## 2018-12-03 ENCOUNTER — Encounter (HOSPITAL_COMMUNITY): Payer: Self-pay

## 2018-12-03 ENCOUNTER — Inpatient Hospital Stay (HOSPITAL_COMMUNITY)
Admission: EM | Admit: 2018-12-03 | Discharge: 2018-12-05 | DRG: 177 | Disposition: A | Discharge status: Home Health | Payer: PPO | Attending: Internal Medicine | Admitting: Internal Medicine

## 2018-12-03 ENCOUNTER — Ambulatory Visit: Payer: PPO | Admitting: Family Medicine

## 2018-12-03 DIAGNOSIS — K2950 Unspecified chronic gastritis without bleeding: Secondary | ICD-10-CM | POA: Diagnosis not present

## 2018-12-03 DIAGNOSIS — Z79899 Other long term (current) drug therapy: Secondary | ICD-10-CM

## 2018-12-03 DIAGNOSIS — G894 Chronic pain syndrome: Secondary | ICD-10-CM | POA: Diagnosis not present

## 2018-12-03 DIAGNOSIS — Z9049 Acquired absence of other specified parts of digestive tract: Secondary | ICD-10-CM

## 2018-12-03 DIAGNOSIS — K760 Fatty (change of) liver, not elsewhere classified: Secondary | ICD-10-CM | POA: Diagnosis not present

## 2018-12-03 DIAGNOSIS — Z20828 Contact with and (suspected) exposure to other viral communicable diseases: Secondary | ICD-10-CM | POA: Diagnosis present

## 2018-12-03 DIAGNOSIS — J189 Pneumonia, unspecified organism: Secondary | ICD-10-CM

## 2018-12-03 DIAGNOSIS — G4733 Obstructive sleep apnea (adult) (pediatric): Secondary | ICD-10-CM | POA: Diagnosis not present

## 2018-12-03 DIAGNOSIS — Z9181 History of falling: Secondary | ICD-10-CM

## 2018-12-03 DIAGNOSIS — IMO0002 Reserved for concepts with insufficient information to code with codable children: Secondary | ICD-10-CM | POA: Diagnosis present

## 2018-12-03 DIAGNOSIS — E871 Hypo-osmolality and hyponatremia: Secondary | ICD-10-CM | POA: Diagnosis not present

## 2018-12-03 DIAGNOSIS — B3781 Candidal esophagitis: Secondary | ICD-10-CM | POA: Diagnosis present

## 2018-12-03 DIAGNOSIS — J690 Pneumonitis due to inhalation of food and vomit: Principal | ICD-10-CM

## 2018-12-03 DIAGNOSIS — E785 Hyperlipidemia, unspecified: Secondary | ICD-10-CM | POA: Diagnosis present

## 2018-12-03 DIAGNOSIS — J45909 Unspecified asthma, uncomplicated: Secondary | ICD-10-CM | POA: Diagnosis not present

## 2018-12-03 DIAGNOSIS — H538 Other visual disturbances: Secondary | ICD-10-CM | POA: Diagnosis present

## 2018-12-03 DIAGNOSIS — Z8719 Personal history of other diseases of the digestive system: Secondary | ICD-10-CM

## 2018-12-03 DIAGNOSIS — N179 Acute kidney failure, unspecified: Secondary | ICD-10-CM | POA: Diagnosis not present

## 2018-12-03 DIAGNOSIS — I10 Essential (primary) hypertension: Secondary | ICD-10-CM | POA: Diagnosis present

## 2018-12-03 DIAGNOSIS — Z66 Do not resuscitate: Secondary | ICD-10-CM | POA: Diagnosis present

## 2018-12-03 DIAGNOSIS — R112 Nausea with vomiting, unspecified: Secondary | ICD-10-CM | POA: Insufficient documentation

## 2018-12-03 DIAGNOSIS — R0602 Shortness of breath: Secondary | ICD-10-CM

## 2018-12-03 DIAGNOSIS — E1151 Type 2 diabetes mellitus with diabetic peripheral angiopathy without gangrene: Secondary | ICD-10-CM | POA: Diagnosis present

## 2018-12-03 DIAGNOSIS — T17908A Unspecified foreign body in respiratory tract, part unspecified causing other injury, initial encounter: Secondary | ICD-10-CM | POA: Diagnosis present

## 2018-12-03 DIAGNOSIS — K229 Disease of esophagus, unspecified: Secondary | ICD-10-CM | POA: Diagnosis not present

## 2018-12-03 DIAGNOSIS — E1140 Type 2 diabetes mellitus with diabetic neuropathy, unspecified: Secondary | ICD-10-CM | POA: Diagnosis present

## 2018-12-03 DIAGNOSIS — H532 Diplopia: Secondary | ICD-10-CM | POA: Diagnosis not present

## 2018-12-03 DIAGNOSIS — E669 Obesity, unspecified: Secondary | ICD-10-CM | POA: Diagnosis not present

## 2018-12-03 DIAGNOSIS — D509 Iron deficiency anemia, unspecified: Secondary | ICD-10-CM | POA: Diagnosis not present

## 2018-12-03 DIAGNOSIS — E861 Hypovolemia: Secondary | ICD-10-CM | POA: Diagnosis present

## 2018-12-03 DIAGNOSIS — E1165 Type 2 diabetes mellitus with hyperglycemia: Secondary | ICD-10-CM | POA: Diagnosis present

## 2018-12-03 DIAGNOSIS — Z79891 Long term (current) use of opiate analgesic: Secondary | ICD-10-CM

## 2018-12-03 DIAGNOSIS — N529 Male erectile dysfunction, unspecified: Secondary | ICD-10-CM | POA: Diagnosis present

## 2018-12-03 DIAGNOSIS — R1110 Vomiting, unspecified: Secondary | ICD-10-CM | POA: Diagnosis not present

## 2018-12-03 DIAGNOSIS — K228 Other specified diseases of esophagus: Secondary | ICD-10-CM | POA: Diagnosis not present

## 2018-12-03 DIAGNOSIS — E1110 Type 2 diabetes mellitus with ketoacidosis without coma: Secondary | ICD-10-CM | POA: Diagnosis present

## 2018-12-03 DIAGNOSIS — K3189 Other diseases of stomach and duodenum: Secondary | ICD-10-CM | POA: Diagnosis present

## 2018-12-03 DIAGNOSIS — Z96653 Presence of artificial knee joint, bilateral: Secondary | ICD-10-CM | POA: Diagnosis present

## 2018-12-03 DIAGNOSIS — Z888 Allergy status to other drugs, medicaments and biological substances status: Secondary | ICD-10-CM

## 2018-12-03 DIAGNOSIS — Z981 Arthrodesis status: Secondary | ICD-10-CM

## 2018-12-03 DIAGNOSIS — Z6831 Body mass index (BMI) 31.0-31.9, adult: Secondary | ICD-10-CM | POA: Diagnosis not present

## 2018-12-03 DIAGNOSIS — Z87891 Personal history of nicotine dependence: Secondary | ICD-10-CM

## 2018-12-03 DIAGNOSIS — R748 Abnormal levels of other serum enzymes: Secondary | ICD-10-CM | POA: Diagnosis not present

## 2018-12-03 DIAGNOSIS — J181 Lobar pneumonia, unspecified organism: Secondary | ICD-10-CM | POA: Diagnosis not present

## 2018-12-03 DIAGNOSIS — Z794 Long term (current) use of insulin: Secondary | ICD-10-CM

## 2018-12-03 LAB — GLUCOSE, CAPILLARY
Glucose-Capillary: 142 mg/dL — ABNORMAL HIGH (ref 70–99)
Glucose-Capillary: 189 mg/dL — ABNORMAL HIGH (ref 70–99)
Glucose-Capillary: 192 mg/dL — ABNORMAL HIGH (ref 70–99)
Glucose-Capillary: 227 mg/dL — ABNORMAL HIGH (ref 70–99)
Glucose-Capillary: 231 mg/dL — ABNORMAL HIGH (ref 70–99)
Glucose-Capillary: 231 mg/dL — ABNORMAL HIGH (ref 70–99)
Glucose-Capillary: 276 mg/dL — ABNORMAL HIGH (ref 70–99)

## 2018-12-03 LAB — BASIC METABOLIC PANEL
Anion gap: 18 — ABNORMAL HIGH (ref 5–15)
Anion gap: 18 — ABNORMAL HIGH (ref 5–15)
Anion gap: 14 (ref 5–15)
BUN: 22 mg/dL (ref 8–23)
BUN: 18 mg/dL (ref 8–23)
BUN: 16 mg/dL (ref 8–23)
CO2: 21 mmol/L — ABNORMAL LOW (ref 22–32)
CO2: 22 mmol/L (ref 22–32)
CO2: 21 mmol/L — ABNORMAL LOW (ref 22–32)
Calcium: 8.3 mg/dL — ABNORMAL LOW (ref 8.9–10.3)
Calcium: 8.4 mg/dL — ABNORMAL LOW (ref 8.9–10.3)
Calcium: 8.2 mg/dL — ABNORMAL LOW (ref 8.9–10.3)
Chloride: 86 mmol/L — ABNORMAL LOW (ref 98–111)
Chloride: 90 mmol/L — ABNORMAL LOW (ref 98–111)
Chloride: 93 mmol/L — ABNORMAL LOW (ref 98–111)
Creatinine, Ser: 1.25 mg/dL — ABNORMAL HIGH (ref 0.61–1.24)
Creatinine, Ser: 1.1 mg/dL (ref 0.61–1.24)
Creatinine, Ser: 1.12 mg/dL (ref 0.61–1.24)
GFR calc Af Amer: >60 mL/min (ref >60)
GFR calc Af Amer: >60 mL/min (ref >60)
GFR calc Af Amer: >60 mL/min (ref >60)
GFR calc non Af Amer: >60 mL/min (ref >60)
GFR calc non Af Amer: >60 mL/min (ref >60)
GFR calc non Af Amer: >60 mL/min (ref >60)
Glucose, Bld: 322 mg/dL — ABNORMAL HIGH (ref 70–99)
Glucose, Bld: 205 mg/dL — ABNORMAL HIGH (ref 70–99)
Glucose, Bld: 156 mg/dL — ABNORMAL HIGH (ref 70–99)
Potassium: 4.2 mmol/L (ref 3.5–5.1)
Potassium: 3.5 mmol/L (ref 3.5–5.1)
Potassium: 3.8 mmol/L (ref 3.5–5.1)
Sodium: 125 mmol/L — ABNORMAL LOW (ref 135–145)
Sodium: 130 mmol/L — ABNORMAL LOW (ref 135–145)
Sodium: 128 mmol/L — ABNORMAL LOW (ref 135–145)

## 2018-12-03 LAB — CBC WITH DIFFERENTIAL/PLATELET
Abs Immature Granulocytes: 0.45 10*3/uL — ABNORMAL HIGH (ref 0.00–0.07)
Basophils Absolute: 0 10*3/uL (ref 0.0–0.1)
Basophils Relative: 0 %
Eosinophils Absolute: 0 10*3/uL (ref 0.0–0.5)
Eosinophils Relative: 0 %
HCT: 39.9 % (ref 39.0–52.0)
Hemoglobin: 13.9 g/dL (ref 13.0–17.0)
Immature Granulocytes: 3 %
Lymphocytes Relative: 6 %
Lymphs Abs: 0.9 10*3/uL (ref 0.7–4.0)
MCH: 30 pg (ref 26.0–34.0)
MCHC: 34.8 g/dL (ref 30.0–36.0)
MCV: 86 fL (ref 80.0–100.0)
Monocytes Absolute: 1.1 10*3/uL — ABNORMAL HIGH (ref 0.1–1.0)
Monocytes Relative: 7 %
Neutro Abs: 13.6 10*3/uL — ABNORMAL HIGH (ref 1.7–7.7)
Neutrophils Relative %: 84 %
Platelets: 480 10*3/uL — ABNORMAL HIGH (ref 150–400)
RBC: 4.64 MIL/uL (ref 4.22–5.81)
RDW: 11.5 % (ref 11.5–15.5)
WBC: 16.1 10*3/uL — ABNORMAL HIGH (ref 4.0–10.5)
nRBC: 0 % (ref 0.0–0.2)

## 2018-12-03 LAB — URINALYSIS, ROUTINE W REFLEX MICROSCOPIC
Bilirubin Urine: NEGATIVE
Glucose, UA: >=500 mg/dL — AB
Hgb urine dipstick: NEGATIVE
Ketones, ur: 20 mg/dL — AB
Leukocytes,Ua: NEGATIVE
Nitrite: NEGATIVE
Protein, ur: NEGATIVE mg/dL
Specific Gravity, Urine: 1.013 (ref 1.005–1.030)
pH: 5 (ref 5.0–8.0)

## 2018-12-03 LAB — IRON AND TIBC
Iron: 19 ug/dL — ABNORMAL LOW (ref 45–182)
Saturation Ratios: 10 % — ABNORMAL LOW (ref 17.9–39.5)
TIBC: 189 ug/dL — ABNORMAL LOW (ref 250–450)
UIBC: 170 ug/dL

## 2018-12-03 LAB — BLOOD GAS, VENOUS
Acid-base deficit: 0.1 mmol/L (ref 0.0–2.0)
Bicarbonate: 23 mmol/L (ref 20.0–28.0)
Drawn by: 270211
O2 Saturation: 86.8 %
Patient temperature: 98.6
pCO2, Ven: 34.1 mmHg — ABNORMAL LOW (ref 44.0–60.0)
pH, Ven: 7.444 — ABNORMAL HIGH (ref 7.250–7.430)
pO2, Ven: 53.1 mmHg — ABNORMAL HIGH (ref 32.0–45.0)

## 2018-12-03 LAB — BETA-HYDROXYBUTYRIC ACID: Beta-Hydroxybutyric Acid: 3.09 mmol/L — ABNORMAL HIGH (ref 0.05–0.27)

## 2018-12-03 LAB — MRSA PCR SCREENING: MRSA by PCR: NEGATIVE

## 2018-12-03 LAB — COMPREHENSIVE METABOLIC PANEL
ALT: 126 U/L — ABNORMAL HIGH (ref 0–44)
AST: 105 U/L — ABNORMAL HIGH (ref 15–41)
Albumin: 3 g/dL — ABNORMAL LOW (ref 3.5–5.0)
Alkaline Phosphatase: 116 U/L (ref 38–126)
Anion gap: 17 — ABNORMAL HIGH (ref 5–15)
BUN: 23 mg/dL (ref 8–23)
CO2: 22 mmol/L (ref 22–32)
Calcium: 8.2 mg/dL — ABNORMAL LOW (ref 8.9–10.3)
Chloride: 86 mmol/L — ABNORMAL LOW (ref 98–111)
Creatinine, Ser: 1.31 mg/dL — ABNORMAL HIGH (ref 0.61–1.24)
GFR calc Af Amer: >60 mL/min (ref >60)
GFR calc non Af Amer: 57 mL/min — ABNORMAL LOW (ref >60)
Glucose, Bld: 333 mg/dL — ABNORMAL HIGH (ref 70–99)
Potassium: 3.9 mmol/L (ref 3.5–5.1)
Sodium: 125 mmol/L — ABNORMAL LOW (ref 135–145)
Total Bilirubin: 1.5 mg/dL — ABNORMAL HIGH (ref 0.3–1.2)
Total Protein: 7.3 g/dL (ref 6.5–8.1)

## 2018-12-03 LAB — BILIRUBIN, FRACTIONATED(TOT/DIR/INDIR)
Bilirubin, Direct: 0.3 mg/dL — ABNORMAL HIGH (ref 0.0–0.2)
Indirect Bilirubin: 0.9 mg/dL (ref 0.3–0.9)
Total Bilirubin: 1.2 mg/dL (ref 0.3–1.2)

## 2018-12-03 LAB — CBG MONITORING, ED: Glucose-Capillary: 301 mg/dL — ABNORMAL HIGH (ref 70–99)

## 2018-12-03 LAB — PROTIME-INR
INR: 1.3 — ABNORMAL HIGH (ref 0.8–1.2)
Prothrombin Time: 16.2 seconds — ABNORMAL HIGH (ref 11.4–15.2)

## 2018-12-03 LAB — FERRITIN: Ferritin: 1270 ng/mL — ABNORMAL HIGH (ref 24–336)

## 2018-12-03 LAB — LACTIC ACID, PLASMA
Lactic Acid, Venous: 1.5 mmol/L (ref 0.5–1.9)
Lactic Acid, Venous: 1.2 mmol/L (ref 0.5–1.9)

## 2018-12-03 LAB — SARS CORONAVIRUS 2 BY RT PCR (HOSPITAL ORDER, PERFORMED IN ~~LOC~~ HOSPITAL LAB): SARS Coronavirus 2: NEGATIVE

## 2018-12-03 MED ORDER — INSULIN GLARGINE 100 UNIT/ML ~~LOC~~ SOLN
30.0000 [IU] | Freq: Every day | SUBCUTANEOUS | Status: DC
  Administered 2018-12-04 (×2): 30 [IU] via SUBCUTANEOUS
  Filled 2018-12-03 (×2): qty 0.3

## 2018-12-03 MED ORDER — PREGABALIN 50 MG PO CAPS
50.0000 mg | ORAL_CAPSULE | Freq: Every day | ORAL | Status: DC
  Administered 2018-12-03 – 2018-12-04 (×2): 50 mg via ORAL
  Filled 2018-12-03 (×2): qty 1

## 2018-12-03 MED ORDER — HYDROCODONE-ACETAMINOPHEN 10-325 MG PO TABS: 2.0000 | ORAL_TABLET | Freq: Three times a day (TID) | ORAL | Status: DC

## 2018-12-03 MED ORDER — SODIUM CHLORIDE 0.9 % IV SOLN
3.0000 g | Freq: Four times a day (QID) | INTRAVENOUS | Status: DC
  Administered 2018-12-03 – 2018-12-05 (×6): 3 g via INTRAVENOUS
  Filled 2018-12-03: qty 3
  Filled 2018-12-03: qty 8
  Filled 2018-12-03 (×2): qty 3
  Filled 2018-12-03 (×4): qty 8
  Filled 2018-12-03: qty 3

## 2018-12-03 MED ORDER — SODIUM CHLORIDE 0.9 % IV SOLN
2.0000 g | Freq: Once | INTRAVENOUS | Status: AC
  Administered 2018-12-03: 2 g via INTRAVENOUS
  Filled 2018-12-03: qty 2

## 2018-12-03 MED ORDER — INSULIN REGULAR(HUMAN) IN NACL 100-0.9 UT/100ML-% IV SOLN
INTRAVENOUS | Status: DC
  Administered 2018-12-03: 2.4 [IU]/h via INTRAVENOUS
  Filled 2018-12-03: qty 100

## 2018-12-03 MED ORDER — METHOCARBAMOL 500 MG PO TABS
500.0000 mg | ORAL_TABLET | Freq: Three times a day (TID) | ORAL | Status: DC | PRN
  Administered 2018-12-03 – 2018-12-05 (×3): 500 mg via ORAL
  Filled 2018-12-03 (×3): qty 1

## 2018-12-03 MED ORDER — SODIUM CHLORIDE 0.9% FLUSH
3.0000 mL | Freq: Two times a day (BID) | INTRAVENOUS | Status: DC
  Administered 2018-12-03: 3 mL via INTRAVENOUS

## 2018-12-03 MED ORDER — VANCOMYCIN HCL 10 G IV SOLR
2500.0000 mg | Freq: Once | INTRAVENOUS | Status: AC
  Administered 2018-12-03: 2500 mg via INTRAVENOUS
  Filled 2018-12-03: qty 2500

## 2018-12-03 MED ORDER — CHLORHEXIDINE GLUCONATE CLOTH 2 % EX PADS
6.0000 | MEDICATED_PAD | Freq: Every day | CUTANEOUS | Status: DC
  Administered 2018-12-03 – 2018-12-04 (×2): 6 via TOPICAL

## 2018-12-03 MED ORDER — VANCOMYCIN HCL IN DEXTROSE 1-5 GM/200ML-% IV SOLN
1000.0000 mg | Freq: Once | INTRAVENOUS | Status: DC
  Filled 2018-12-03: qty 200

## 2018-12-03 MED ORDER — AZELASTINE HCL 0.1 % NA SOLN
2.0000 | Freq: Two times a day (BID) | NASAL | Status: DC | PRN
  Filled 2018-12-03: qty 30

## 2018-12-03 MED ORDER — METOPROLOL SUCCINATE ER 50 MG PO TB24
100.0000 mg | ORAL_TABLET | Freq: Every day | ORAL | Status: DC
  Administered 2018-12-03 – 2018-12-05 (×3): 100 mg via ORAL
  Filled 2018-12-03 (×2): qty 1
  Filled 2018-12-03: qty 2

## 2018-12-03 MED ORDER — SODIUM CHLORIDE 0.9% FLUSH
3.0000 mL | Freq: Once | INTRAVENOUS | Status: AC
  Administered 2018-12-03: 3 mL via INTRAVENOUS

## 2018-12-03 MED ORDER — POTASSIUM CHLORIDE 10 MEQ/100ML IV SOLN
10.0000 meq | INTRAVENOUS | Status: AC
  Administered 2018-12-03 (×2): 10 meq via INTRAVENOUS
  Filled 2018-12-03 (×2): qty 100

## 2018-12-03 MED ORDER — DEXTROSE-NACL 5-0.45 % IV SOLN
INTRAVENOUS | Status: DC
  Administered 2018-12-03 – 2018-12-04 (×2): via INTRAVENOUS

## 2018-12-03 MED ORDER — ACETAMINOPHEN 325 MG PO TABS
650.0000 mg | ORAL_TABLET | Freq: Once | ORAL | Status: AC
  Administered 2018-12-03: 650 mg via ORAL
  Filled 2018-12-03: qty 2

## 2018-12-03 MED ORDER — SODIUM CHLORIDE 0.9 % IV BOLUS
1000.0000 mL | Freq: Once | INTRAVENOUS | Status: AC
  Administered 2018-12-03: 1000 mL via INTRAVENOUS

## 2018-12-03 MED ORDER — SODIUM CHLORIDE 0.9 % IV SOLN
INTRAVENOUS | Status: AC
  Administered 2018-12-03: 15:00:00 via INTRAVENOUS

## 2018-12-03 MED ORDER — HYDROCODONE-ACETAMINOPHEN 10-325 MG PO TABS
1.0000 | ORAL_TABLET | Freq: Four times a day (QID) | ORAL | Status: DC | PRN
  Administered 2018-12-04 – 2018-12-05 (×3): 1 via ORAL
  Filled 2018-12-03 (×3): qty 1

## 2018-12-03 MED ORDER — PANTOPRAZOLE SODIUM 40 MG IV SOLR
40.0000 mg | INTRAVENOUS | Status: DC
  Administered 2018-12-03 – 2018-12-04 (×2): 40 mg via INTRAVENOUS
  Filled 2018-12-03 (×2): qty 40

## 2018-12-03 MED ORDER — ORAL CARE MOUTH RINSE
15.0000 mL | Freq: Two times a day (BID) | OROMUCOSAL | Status: DC
  Administered 2018-12-03 – 2018-12-05 (×4): 15 mL via OROMUCOSAL

## 2018-12-03 MED ORDER — MORPHINE SULFATE ER 30 MG PO TBCR
30.0000 mg | EXTENDED_RELEASE_TABLET | Freq: Two times a day (BID) | ORAL | Status: DC
  Administered 2018-12-03 – 2018-12-05 (×4): 30 mg via ORAL
  Filled 2018-12-03 (×4): qty 1

## 2018-12-03 MED ORDER — LORATADINE 10 MG PO TABS
10.0000 mg | ORAL_TABLET | Freq: Every day | ORAL | Status: DC
  Administered 2018-12-03 – 2018-12-05 (×3): 10 mg via ORAL
  Filled 2018-12-03 (×3): qty 1

## 2018-12-03 MED ORDER — ONDANSETRON HCL 4 MG/2ML IJ SOLN
4.0000 mg | Freq: Three times a day (TID) | INTRAMUSCULAR | Status: DC
  Administered 2018-12-03 – 2018-12-05 (×5): 4 mg via INTRAVENOUS
  Filled 2018-12-03 (×6): qty 2

## 2018-12-03 MED ORDER — SODIUM CHLORIDE 0.9 % IV SOLN
INTRAVENOUS | Status: DC
  Administered 2018-12-04: 06:00:00 via INTRAVENOUS

## 2018-12-03 MED ORDER — ENOXAPARIN SODIUM 60 MG/0.6ML ~~LOC~~ SOLN
60.0000 mg | SUBCUTANEOUS | Status: DC
  Administered 2018-12-03 – 2018-12-04 (×2): 60 mg via SUBCUTANEOUS
  Filled 2018-12-03 (×2): qty 0.6

## 2018-12-03 NOTE — Progress Notes (Signed)
A consult was received from an ED physician for vancomycin and cefepime per pharmacy dosing.  The patient's profile has been reviewed for ht/wt/allergies/indication/available labs.    A one time order has been placed for cefepime 2 gm IV and vancomycin 2500 mg IV x1.  Further antibiotics/pharmacy consults should be ordered by admitting physician if indicated.                       Thank you, Lynelle Doctor 12/03/2018  8:26 AM

## 2018-12-03 NOTE — Telephone Encounter (Signed)
Patient arrived for his appointment at 7 am.  Patient complaining of nausea, vomiting and diarrhea for 10 days.  Patient states he "can not keep anything down".  Patient complains of lower back "kidney pain" and dyspnea.  Patient denies dysuria, hematuria, or abdominal pain.  SPO2 91% Pulse 107 Glucose 295  Patient is going to [employer] .

## 2018-12-03 NOTE — Telephone Encounter (Signed)
Given reported hx (prolonged vomiting and diarrhea), past hx of complicated pancreatitis, low pulse ox, tachycardia recommended ER evaluation.

## 2018-12-03 NOTE — ED Provider Notes (Signed)
[facility]-EMERGENCY DEPT Provider Note   CSN: [id number] Arrival date & time: 12/03/18  [id number]     History   Chief Complaint Chief Complaint  Patient presents with  . Weakness    HPI [name] is a 68 y.o. male.     68yo M w/ PMH including PVD, T2DM, gallstone pancreatitis, cholecystectomy, HTN, HLD, OSA who p/w fever and vomiting.  Patient states that approximately 10 days ago he began feeling bad and has had significant vomiting.  He has had progressively worsening generalized weakness.  He had a few episodes of diarrhea last week but has not had any of that this week.  He reports that any thought of food makes him sick to his stomach.  He has had low-grade fevers but no high fevers.  Occasional cough but no severe cough or shortness of breath.  He denies any abdominal pain or urinary symptoms.  No chest pain.  No sick contacts. No rash or skin changes. Denies alcohol use.  The history is provided by the patient.  Weakness   Past Medical History:  Diagnosis Date  . Arthritis    oa--and ra.  s/p right knee replacement on Jan 28, 2011--plans left knee replacement on 03/04/11.  also severe lower back pain-previous lumbar fusions, also pain in both hips, shoulders   . Asthma   . Diabetes mellitus   . Gallstone pancreatitis   . GERD (gastroesophageal reflux disease)   . Hepatitis    pancreatitis 2010  . Hyperlipidemia   . Hypertension    eccho 6/10, EKG 1/11, clearence Dr Caryl NeverBurchette from 11/28/10 on chart  . Peripheral vascular disease (HCC)    states after arthroscopy left and right was given blood thinners- had symptoms of blood clot but wasnt diagnosed  . Pneumonia    2010  . Sleep apnea    study years ago/ no cpap/ states mild    Patient Active Problem List   Diagnosis Date Noted  . Fatty liver 07/27/2018  . Type 2 diabetes, uncontrolled, with neuropathy (HCC) 02/02/2015  . Obesity (BMI 30-39.9) 03/03/2013  . Onychomycosis 10/01/2012  .  S/P left total knee replacement 03/05/2011  . Herpes simplex type 2 infection 11/28/2010  . ERECTILE DYSFUNCTION, ORGANIC 04/10/2010  . GALLSTONE PANCREATITIS 01/31/2010  . Hyperlipidemia 04/24/2009  . ANEMIA-IRON DEFICIENCY 09/29/2008  . Essential hypertension 09/29/2008  . ANEMIA, HX OF 09/29/2008    Past Surgical History:  Procedure Laterality Date  . BACK SURGERY     hx of multiple back surgeries including lumbar fusion S1-L3  . CHOLECYSTECTOMY    . COLON SURGERY     removal of part of small intestine 1979  . ELBOW SURGERY     bilateral  . SHOULDER SURGERY     bilateral   . TOTAL KNEE ARTHROPLASTY  01/28/2011   Procedure: TOTAL KNEE ARTHROPLASTY;  Surgeon: Shelda PalMatthew D Olin;  Location: [facility];  Service: Orthopedics;  Laterality: Right;  femoral nerve block preop.  Marland Kitchen. TOTAL KNEE ARTHROPLASTY  03/04/2011   Procedure: TOTAL KNEE ARTHROPLASTY;  Surgeon: Shelda PalMatthew D Olin;  Location: [facility];  Service: Orthopedics;  Laterality: Left;  . TUBAL LIGATION          Home Medications    Prior to Admission medications   Medication Sig Start Date End Date Taking? Authorizing Provider  amLODipine (NORVASC) 10 MG tablet Take 1 tablet (10 mg total) by mouth daily. 01/27/18  Yes Burchette, Elberta FortisBruce W, MD  azelastine (ASTELIN) 0.1 % nasal  spray USE 2 SPRAYS IN EACH NOSTRIL TWICE A DAY AS NEEDED FOR ALLERGIES Patient taking differently: Place 2 sprays into both nostrils 2 (two) times daily as needed for allergies.  11/10/16  Yes Burchette, Alinda Sierras, MD  docusate sodium (COLACE) 100 MG capsule Take 100 mg by mouth 3 (three) times daily as needed. Constipation    Yes [provider]  HYDROcodone-acetaminophen (NORCO) 10-325 MG per tablet Take 2 tablets every 8-12 hours 08/24/11  Yes [provider]  insulin glargine (LANTUS) 100 unit/mL SOPN Inject 60 units subcutaneous twice daily 07/27/18  Yes Burchette, Alinda Sierras, MD  lisinopril (ZESTRIL) 40 MG tablet TAKE 1 TABLET BY MOUTH EVERY DAY IN THE  MORNING Patient taking differently: Take 40 mg by mouth daily.  09/21/18  Yes Burchette, Alinda Sierras, MD  methocarbamol (ROBAXIN) 500 MG tablet Take 500 mg by mouth every 8 (eight) hours as needed. 12/02/16  Yes [provider]  metoprolol succinate (TOPROL-XL) 100 MG 24 hr tablet TAKE 1 TABLET BY MOUTH EVERY MORNING Patient taking differently: Take 100 mg by mouth daily.  10/04/18  Yes Burchette, Alinda Sierras, MD  morphine (MS CONTIN) 30 MG 12 hr tablet Take 30 mg by mouth every 12 (twelve) hours.  07/03/17  Yes [provider]  NOVOLOG 100 UNIT/ML injection INJECT 3 UNITS 3 TIMES A DAY **ALSO USE SLIDING SCALE** Patient taking differently: Inject 3-30 Units into the skin 3 (three) times daily with meals. Sliding scale instructions. 07/27/18  Yes Burchette, Alinda Sierras, MD  polyethylene glycol (MIRALAX / GLYCOLAX) packet Take 17 g by mouth daily as needed. Constipation    Yes [provider]  pregabalin (LYRICA) 50 MG capsule Take 50 mg by mouth at bedtime as needed.  06/10/18  Yes [provider]  simvastatin (ZOCOR) 40 MG tablet TAKE 1 TABLET BY MOUTH EVERY DAY Patient taking differently: Take 40 mg by mouth daily.  07/06/18  Yes Burchette, Alinda Sierras, MD  fexofenadine-pseudoephedrine (ALLEGRA-D 12 HOUR) 60-120 MG per tablet Take 1 tablet by mouth 2 (two) times daily. Patient not taking: Reported on 12/03/2018 07/02/12   [name], MD  glucose blood (ONE TOUCH ULTRA TEST) test strip USE TO CHECK BLOOD SUGAR IN THE AM AND PM AS DIRECTED.  Dx E11.9 10/20/17   [name], MD  Insulin Pen Needle 31G X 5 MM MISC Use as directed with Kara Mead 03/09/18   Burchette, Alinda Sierras, MD  Insulin Syringe-Needle U-100 (BD INSULIN SYRINGE ULTRAFINE) 31G X 5/16" 0.5 ML MISC Use daily as directed 10/27/11   Burchette, Alinda Sierras, MD    Family History No family history on file.  Social History Social History   Tobacco Use  . Smoking status: Former Smoker    Types: Cigars  .  Smokeless tobacco: Former Systems developer    Quit date: 01/22/1991  Substance Use Topics  . Alcohol use: No  . Drug use: No     Allergies   Cortisone   Review of Systems Review of Systems  Neurological: Positive for weakness.   All other systems reviewed and are negative except that which was mentioned in HPI   Physical Exam Updated Vital Signs BP (!) 169/83   Pulse 87   Temp 98.7 F (37.1 C) (Oral)   Resp (!) 27   Ht 6\' 4"  (1.93 m)   Wt 124.7 kg   SpO2 96%   BMI 33.47 kg/m   Physical Exam Vitals signs and nursing note reviewed.  Constitutional:  General: He is not in acute distress.    Appearance: He is well-developed. He is not toxic-appearing.  HENT:     Head: Normocephalic and atraumatic.     Mouth/Throat:     Mouth: Mucous membranes are dry.  Eyes:     Conjunctiva/sclera: Conjunctivae normal.  Neck:     Musculoskeletal: Neck supple.  Cardiovascular:     Rate and Rhythm: Regular rhythm. Tachycardia present.     Heart sounds: Normal heart sounds. No murmur.  Pulmonary:     Effort: Pulmonary effort is normal.     Breath sounds: Normal breath sounds.  Abdominal:     General: There is no distension.     Palpations: Abdomen is soft.     Tenderness: There is no abdominal tenderness.  Musculoskeletal:        General: No tenderness.  Skin:    General: Skin is warm and dry.  Neurological:     Mental Status: He is alert and oriented to person, place, and time.     Comments: Fluent speech  Psychiatric:        Judgment: Judgment normal.      ED Treatments / Results  Labs (all labs ordered are listed, but only abnormal results are displayed) Labs Reviewed  COMPREHENSIVE METABOLIC PANEL - Abnormal; Notable for the following components:      Result Value   Sodium 125 (*)    Chloride 86 (*)    Glucose, Bld 333 (*)    Creatinine, Ser 1.31 (*)    Calcium 8.2 (*)    Albumin 3.0 (*)    AST 105 (*)    ALT 126 (*)    Total Bilirubin 1.5 (*)    GFR calc non Af  Amer 57 (*)    Anion gap 17 (*)    All other components within normal limits  CBC WITH DIFFERENTIAL/PLATELET - Abnormal; Notable for the following components:   WBC 16.1 (*)    Platelets 480 (*)    Neutro Abs 13.6 (*)    Monocytes Absolute 1.1 (*)    Abs Immature Granulocytes 0.45 (*)    All other components within normal limits  URINALYSIS, ROUTINE W REFLEX MICROSCOPIC - Abnormal; Notable for the following components:   Glucose, UA >=500 (*)    Ketones, ur 20 (*)    Bacteria, UA RARE (*)    All other components within normal limits  PROTIME-INR - Abnormal; Notable for the following components:   Prothrombin Time 16.2 (*)    INR 1.3 (*)    All other components within normal limits  BLOOD GAS, VENOUS - Abnormal; Notable for the following components:   pH, Ven 7.444 (*)    pCO2, Ven 34.1 (*)    pO2, Ven 53.1 (*)    All other components within normal limits  SARS CORONAVIRUS 2 (HOSPITAL ORDER, PERFORMED IN [facility])  CULTURE, BLOOD (ROUTINE X 2)  CULTURE, BLOOD (ROUTINE X 2)  URINE CULTURE  LACTIC ACID, PLASMA  LACTIC ACID, PLASMA    EKG EKG Interpretation  Date/Time:  Friday December 03 2018 07:59:30 EDT Ventricular Rate:  103 PR Interval:    QRS Duration: 93 QT Interval:  356 QTC Calculation: 466 R Axis:   25 Text Interpretation:  Sinus tachycardia rate faster than previous Confirmed by Frederick Peers [phone]) on 12/03/2018 8:32:38 AM   Radiology US Abdomen Limited  Result Date: 12/03/2018 CLINICAL DATA:  Vomiting, fever EXAM: ULTRASOUND ABDOMEN LIMITED RIGHT UPPER QUADRANT COMPARISON:  01/21/2010  FINDINGS: Gallbladder: Surgically absent Common bile duct: Diameter: 8 mm, which may be normal post cholecystectomy, previously 6 mm pre cholecystectomy. Liver: Mildly heterogeneous increased echogenicity of the liver question subtle fatty infiltration. No mass or nodularity. Portal vein is patent on color Doppler imaging with normal direction of blood flow  towards the liver. Other: No RIGHT upper quadrant free fluid. IMPRESSION: Question subtle fatty infiltration of liver. Post cholecystectomy with with 8 mm CBD, which may be physiologic post cholecystectomy; recommend correlation with LFTs. Electronically Signed   By: Ulyses Southward M.D.   On: 12/03/2018 10:44   Dg Chest Port 1 View  Result Date: 12/03/2018 CLINICAL DATA:  Fever EXAM: PORTABLE CHEST 1 VIEW COMPARISON:  January 22, 2011 FINDINGS: There is apparent consolidation in the right upper lobe. Lungs elsewhere are clear. Heart is upper normal in size with pulmonary vascularity normal. No adenopathy. No bone lesions. IMPRESSION: Consolidation right upper lobe. Suspect pneumonia. Underlying mass in this area cannot be excluded radiographically. Lungs elsewhere clear. Heart upper normal in size. No evident adenopathy. Followup PA and lateral radiographs recommended in 3-4 weeks following trial of antibiotic therapy to ensure resolution and exclude underlying malignancy. Electronically Signed   By: Bretta Bang III M.D.   On: 12/03/2018 09:30    Procedures .Critical Care Performed by: Laurence Spates, MD Authorized by: Laurence Spates, MD   Critical care provider statement:    Critical care time (minutes):  30   Critical care time was exclusive of:  Separately billable procedures and treating other patients   Critical care was necessary to treat or prevent imminent or life-threatening deterioration of the following conditions:  Sepsis   Critical care was time spent personally by me on the following activities:  Development of treatment plan with patient or surrogate, discussions with consultants, evaluation of patient's response to treatment, examination of patient, obtaining history from patient or surrogate, ordering and performing treatments and interventions, ordering and review of laboratory studies, ordering and review of radiographic studies and re-evaluation of patient's  condition   (including critical care time)  Medications Ordered in ED Medications  acetaminophen (TYLENOL) tablet 650 mg (650 mg Oral Given 12/03/18 0755)  sodium chloride flush (NS) 0.9 % injection 3 mL (3 mLs Intravenous Given 12/03/18 0821)  ceFEPIme (MAXIPIME) 2 g in sodium chloride 0.9 % 100 mL IVPB (0 g Intravenous Stopped 12/03/18 0903)  vancomycin (VANCOCIN) 2,500 mg in sodium chloride 0.9 % 500 mL IVPB (0 mg Intravenous Stopped 12/03/18 1130)  sodium chloride 0.9 % bolus 1,000 mL (0 mLs Intravenous Stopped 12/03/18 1130)     Initial Impression / Assessment and Plan / ED Course  I have reviewed the triage vital signs and the nursing notes.  Pertinent labs & imaging results that were available during my care of the patient were reviewed by me and considered in my medical decision making (see chart for details).       PT w/ T 104 on arrival, non-toxic in appearance.  He denied any respiratory complaints.  Based on abnormal vital signs, initiated code sepsis with blood and urine cultures, broad-spectrum antibiotics with vancomycin and cefepime.   Labs notable for normal lactate, sodium 125, CO2 22, glucose 333, creatinine 1.3, anion gap 17, reassuring VBG, WBC 16. COVID-19 negative.  AST 105, ALT 126, total bilirubin 1.5.  Chest x-ray with right upper lobe consolidation, pneumonia versus mass.  Because of abnormal LFTs, obtained right upper quadrant ultrasound which shows postcholecystectomy with 8 mm common bile  duct.  He denies any upper abdominal pain however given his abnormal lab work and reported vomiting, I contacted GI and discussed with Gunnar Fusiaula, who will see the patient in consultation for [place]. Discussed admission w/ [facility], Dr. Mahala MenghiniSamtani, and pt admitted for further care. Final Clinical Impressions(s) / ED Diagnoses   Final diagnoses:  Pneumonia of right upper lobe due to infectious organism (HCC)  AKI (acute kidney injury) (HCC)  Nausea and vomiting, intractability of vomiting  not specified, unspecified vomiting type    ED Discharge Orders    None       Little, Ambrose Finlandachel Morgan, MD 12/03/18 1517

## 2018-12-03 NOTE — Consult Note (Addendum)
Referring Provider:   [facility]        Primary Care Physician:  [name], MD Primary Gastroenterologist:  ? [place] (remote history)         Reason for Consultation:   Nausea and abnormal liver tests                ASSESSMENT /  PLAN    1. 68 yo male with 11 days of nausea / vomiting in absence of abdominal pain. Marginal increase in liver enzymes, prominent CBD at 8 mm ( post-cholecystectomy). Nausea / vomiting seem unlikely to be hepatobiliary in origin. PUD? His glucose in in 097'D which can certainly hamper gastric emptying though he says blood sugars had been below 140 when N/V started.  -Will likely need EGD this admission.   -Hopefully blood sugars can get under better control which may help gastric emptying.  -Trial of scheduled anti-emetics in the interim.   2. Abnormal liver tests, mainly hepatocellular pattern. Probable fatty liver on [place].  He has a chronically elevated ALT with hx of fatty liver disease.  Enzymes currently a little less than 3x ULN. T.bili just above normal. CBD 8 mm post-cholecystectomy state.  -follow up liver tests in am. At this point the numbers are not overly concerning.   -fractionate bilirubin  3. Hyponatremia, NA+ 125 and likely from N/V.  4. Leukocytosis, WBC 16. May be secondary to PNA  5. RUL PNA, ? Aspiration.  -Primary team start antibiotics  HPI:     [name] is a 68 y.o. male with a PMH remarkable for DM, pancreatitis (possible biliary), hypertension, obesity, chronic pain requiring narcotics, .   Patient presented to ED this am with complaints of nausea / vomiting, and fevers. Symptoms started acutely 11 days ago. Initially he had mild diarrhea but that quickly resolved. For 5 days he was unable to hold anything down including medications.  Around the sixth day he was able to tolerate medications and has since been able to tolerate diabetic shakes but absolutely no food. Emesis non-bloody and consists of  whatever he tried to eat. He hasn't had any associated abdominal pain. No significant NSAID use, maybe two doses a month. He hasn't had any recent medication changes other than the type of insulin he was using. AM blood sugars leading up to onset of N/V were 140 or below but now they "are all over the place"  ED course:  febrile temp 104.2. Normal 02 saturation.  WBC 16 Normal hgb, platelets 480 Glucose 333 Cr 1.31 AST 105/ ALT 126   Baseline normal AST / ALT 50-70 Tbili 1.5, alk phos 116 INR 1.3  Past Medical History:  Diagnosis Date  . Arthritis    oa--and ra.  s/p right knee replacement on Jan 28, 2011--plans left knee replacement on 03/04/11.  also severe lower back pain-previous lumbar fusions, also pain in both hips, shoulders   . Asthma   . Diabetes mellitus   . Gallstone pancreatitis   . GERD (gastroesophageal reflux disease)   . Hepatitis    pancreatitis 2010  . Hyperlipidemia   . Hypertension    eccho 6/10, EKG 1/11, clearence Dr Elease Hashimoto from 11/28/10 on chart  . Peripheral vascular disease ([place])    states after arthroscopy left and right was given blood thinners- had symptoms of blood clot but wasnt diagnosed  . Pneumonia    2010  . Sleep apnea    study years ago/ no cpap/ states mild  Past Surgical History:  Procedure Laterality Date  . BACK SURGERY     hx of multiple back surgeries including lumbar fusion S1-L3  . CHOLECYSTECTOMY    . COLON SURGERY     removal of part of small intestine 1979  . ELBOW SURGERY     bilateral  . SHOULDER SURGERY     bilateral   . TOTAL KNEE ARTHROPLASTY  01/28/2011   Procedure: TOTAL KNEE ARTHROPLASTY;  Surgeon: Mauri Pole;  Location: [facility];  Service: Orthopedics;  Laterality: Right;  femoral nerve block preop.  Marland Kitchen TOTAL KNEE ARTHROPLASTY  03/04/2011   Procedure: TOTAL KNEE ARTHROPLASTY;  Surgeon: Mauri Pole;  Location: [facility];  Service: Orthopedics;  Laterality: Left;  . TUBAL LIGATION      Prior to Admission  medications   Medication Sig Start Date End Date Taking? Authorizing Provider  amLODipine (NORVASC) 10 MG tablet Take 1 tablet (10 mg total) by mouth daily. 01/27/18  Yes Burchette, Alinda Sierras, MD  azelastine (ASTELIN) 0.1 % nasal spray USE 2 SPRAYS IN EACH NOSTRIL TWICE A DAY AS NEEDED FOR ALLERGIES Patient taking differently: Place 2 sprays into both nostrils 2 (two) times daily as needed for allergies.  11/10/16  Yes Burchette, Alinda Sierras, MD  docusate sodium (COLACE) 100 MG capsule Take 100 mg by mouth 3 (three) times daily as needed. Constipation    Yes [provider]  HYDROcodone-acetaminophen (NORCO) 10-325 MG per tablet Take 2 tablets every 8-12 hours 08/24/11  Yes [provider]  insulin glargine (LANTUS) 100 unit/mL SOPN Inject 60 units subcutaneous twice daily 07/27/18  Yes Burchette, Alinda Sierras, MD  lisinopril (ZESTRIL) 40 MG tablet TAKE 1 TABLET BY MOUTH EVERY DAY IN THE MORNING Patient taking differently: Take 40 mg by mouth daily.  09/21/18  Yes Burchette, Alinda Sierras, MD  methocarbamol (ROBAXIN) 500 MG tablet Take 500 mg by mouth every 8 (eight) hours as needed. 12/02/16  Yes [provider]  metoprolol succinate (TOPROL-XL) 100 MG 24 hr tablet TAKE 1 TABLET BY MOUTH EVERY MORNING Patient taking differently: Take 100 mg by mouth daily.  10/04/18  Yes Burchette, Alinda Sierras, MD  morphine (MS CONTIN) 30 MG 12 hr tablet Take 30 mg by mouth every 12 (twelve) hours.  07/03/17  Yes [provider]  NOVOLOG 100 UNIT/ML injection INJECT 3 UNITS 3 TIMES A DAY **ALSO USE SLIDING SCALE** Patient taking differently: Inject 3-30 Units into the skin 3 (three) times daily with meals. Sliding scale instructions. 07/27/18  Yes Burchette, Alinda Sierras, MD  polyethylene glycol (MIRALAX / GLYCOLAX) packet Take 17 g by mouth daily as needed. Constipation    Yes [provider]  pregabalin (LYRICA) 50 MG capsule Take 50 mg by mouth at bedtime as needed.  06/10/18  Yes [provider]  simvastatin (ZOCOR) 40 MG tablet TAKE 1 TABLET BY MOUTH EVERY DAY Patient taking differently: Take 40 mg by mouth daily.  07/06/18  Yes Burchette, Alinda Sierras, MD  fexofenadine-pseudoephedrine (ALLEGRA-D 12 HOUR) 60-120 MG per tablet Take 1 tablet by mouth 2 (two) times daily. Patient not taking: Reported on 12/03/2018 07/02/12   [name], MD  glucose blood (ONE TOUCH ULTRA TEST) test strip USE TO CHECK BLOOD SUGAR IN THE AM AND PM AS DIRECTED.  Dx E11.9 10/20/17   [name], MD  Insulin Pen Needle 31G X 5 MM MISC Use as directed with Kara Mead 03/09/18   Burchette, Alinda Sierras, MD  Insulin Syringe-Needle U-100 (  BD INSULIN SYRINGE ULTRAFINE) 31G X 5/16" 0.5 ML MISC Use daily as directed 10/27/11   Burchette, Alinda Sierras, MD    No current facility-administered medications for this encounter.    Current Outpatient Medications  Medication Sig Dispense Refill  . amLODipine (NORVASC) 10 MG tablet Take 1 tablet (10 mg total) by mouth daily. 90 tablet 3  . azelastine (ASTELIN) 0.1 % nasal spray USE 2 SPRAYS IN EACH NOSTRIL TWICE A DAY AS NEEDED FOR ALLERGIES (Patient taking differently: Place 2 sprays into both nostrils 2 (two) times daily as needed for allergies. ) 30 mL 0  . docusate sodium (COLACE) 100 MG capsule Take 100 mg by mouth 3 (three) times daily as needed. Constipation     . HYDROcodone-acetaminophen (NORCO) 10-325 MG per tablet Take 2 tablets every 8-12 hours    . insulin glargine (LANTUS) 100 unit/mL SOPN Inject 60 units subcutaneous twice daily 36 mL 11  . lisinopril (ZESTRIL) 40 MG tablet TAKE 1 TABLET BY MOUTH EVERY DAY IN THE MORNING (Patient taking differently: Take 40 mg by mouth daily. ) 90 tablet 1  . methocarbamol (ROBAXIN) 500 MG tablet Take 500 mg by mouth every 8 (eight) hours as needed.  1  . metoprolol succinate (TOPROL-XL) 100 MG 24 hr tablet TAKE 1 TABLET BY MOUTH EVERY MORNING (Patient taking differently: Take 100 mg by mouth daily. ) 90 tablet 2  .  morphine (MS CONTIN) 30 MG 12 hr tablet Take 30 mg by mouth every 12 (twelve) hours.   0  . NOVOLOG 100 UNIT/ML injection INJECT 3 UNITS 3 TIMES A DAY **ALSO USE SLIDING SCALE** (Patient taking differently: Inject 3-30 Units into the skin 3 (three) times daily with meals. Sliding scale instructions.) 60 mL 2  . polyethylene glycol (MIRALAX / GLYCOLAX) packet Take 17 g by mouth daily as needed. Constipation     . pregabalin (LYRICA) 50 MG capsule Take 50 mg by mouth at bedtime as needed.     . simvastatin (ZOCOR) 40 MG tablet TAKE 1 TABLET BY MOUTH EVERY DAY (Patient taking differently: Take 40 mg by mouth daily. ) 90 tablet 2  . fexofenadine-pseudoephedrine (ALLEGRA-D 12 HOUR) 60-120 MG per tablet Take 1 tablet by mouth 2 (two) times daily. (Patient not taking: Reported on 12/03/2018) 60 tablet 6  . glucose blood (ONE TOUCH ULTRA TEST) test strip USE TO CHECK BLOOD SUGAR IN THE AM AND PM AS DIRECTED.  Dx E11.9 200 each 1  . Insulin Pen Needle 31G X 5 MM MISC Use as directed with Basaglar Kwikpen 100 each 1  . Insulin Syringe-Needle U-100 (BD INSULIN SYRINGE ULTRAFINE) 31G X 5/16" 0.5 ML MISC Use daily as directed 500 each 5    Allergies as of 12/03/2018 - Review Complete 12/03/2018  Allergen Reaction Noted  . Cortisone Itching 08/18/2017    No family history on file.  Social History   Socioeconomic History  . Marital status: Married    Spouse name: Not on file  . Number of children: Not on file  . Years of education: Not on file  . Highest education level: Not on file  Occupational History  . Not on file  Social Needs  . Financial resource strain: Not on file  . Food insecurity    Worry: Not on file    Inability: Not on file  . Transportation needs    Medical: Not on file    Non-medical: Not on file  Tobacco Use  . Smoking status: Former Smoker  Types: Cigars  . Smokeless tobacco: Former Systems developer    Quit date: 01/22/1991  Substance and Sexual Activity  . Alcohol use: No  .  Drug use: No  . Sexual activity: Not on file  Lifestyle  . Physical activity    Days per week: Not on file    Minutes per session: Not on file  . Stress: Not on file  Relationships  . Social Herbalist on phone: Not on file    Gets together: Not on file    Attends religious service: Not on file    Active member of club or organization: Not on file    Attends meetings of clubs or organizations: Not on file    Relationship status: Not on file  . Intimate partner violence    Fear of current or ex partner: Not on file    Emotionally abused: Not on file    Physically abused: Not on file    Forced sexual activity: Not on file  Other Topics Concern  . Not on file  Social History Narrative  . Not on file    Review of Systems: All systems reviewed and negative except where noted in HPI.  Physical Exam: Vital signs in last 24 hours: Temp:  [98.7 F (37.1 C)-104.2 F (40.1 C)] 98.7 F (37.1 C) (09/11 1130) Pulse Rate:  [76-105] 88 (09/11 1315) Resp:  [12-28] 21 (09/11 1315) BP: (135-178)/(62-138) 168/85 (09/11 1315) SpO2:  [89 %-100 %] 100 % (09/11 1315) Weight:  [124.7 kg] 124.7 kg (09/11 0745)   General:   Alert, obese male in NAD Psych:  Pleasant, cooperative. Normal mood and affect. Eyes:  Pupils equal, sclera clear, no icterus.   Conjunctiva pink. Ears:  Normal auditory acuity. Nose:  No deformity, discharge,  or lesions. Neck:  Supple; no masses Lungs:  Clear throughout to auscultation.   No wheezes, crackles, or rhonchi.  Heart:  Regular rate and rhythm; no murmurs, no lower extremity edema Abdomen:  Soft, obese, nontender, BS active, no palp mass   Rectal:  Deferred  Msk:  Symmetrical without gross deformities. . Neurologic:  Alert and  oriented x4;  grossly normal neurologically. Skin:  Intact without significant lesions or rashes.   Intake/Output from previous day: No intake/output data recorded. Intake/Output this shift: No intake/output data  recorded.  Lab Results: Recent Labs    12/03/18 0816  WBC 16.1*  HGB 13.9  HCT 39.9  PLT 480*   BMET Recent Labs    12/03/18 0816  NA 125*  K 3.9  CL 86*  CO2 22  GLUCOSE 333*  BUN 23  CREATININE 1.31*  CALCIUM 8.2*   LFT Recent Labs    12/03/18 0816  PROT 7.3  ALBUMIN 3.0*  AST 105*  ALT 126*  ALKPHOS 116  BILITOT 1.5*   PT/INR Recent Labs    12/03/18 0816  LABPROT 16.2*  INR 1.3*   Hepatitis Panel No results for input(s): HEPBSAG, HCVAB, HEPAIGM, HEPBIGM in the last 72 hours.   . CBC Latest Ref Rng & Units 12/03/2018 04/18/2011 03/06/2011  WBC 4.0 - 10.5 K/uL 16.1(H) 8.1 9.6  Hemoglobin 13.0 - 17.0 g/dL 13.9 13.4 10.6(L)  Hematocrit 39.0 - 52.0 % 39.9 39.6 31.0(L)  Platelets 150 - 400 K/uL 480(H) 321.0 231    . CMP Latest Ref Rng & Units 12/03/2018 01/27/2018 07/21/2017  Glucose 70 - 99 mg/dL 333(H) 248(H) -  BUN 8 - 23 mg/dL 23 14 -  Creatinine 0.61 -  1.24 mg/dL 1.31(H) 1.04 -  Sodium 135 - 145 mmol/L 125(L) 139 -  Potassium 3.5 - 5.1 mmol/L 3.9 4.2 -  Chloride 98 - 111 mmol/L 86(L) 101 -  CO2 22 - 32 mmol/L 22 28 -  Calcium 8.9 - 10.3 mg/dL 8.2(L) 9.3 -  Total Protein 6.5 - 8.1 g/dL 7.3 6.6 6.6  Total Bilirubin 0.3 - 1.2 mg/dL 1.5(H) 0.8 0.6  Alkaline Phos 38 - 126 U/L 116 100 100  AST 15 - 41 U/L 105(H) 64(H) 19  ALT 0 - 44 U/L 126(H) 70(H) 54(H)   Studies/Results: US Abdomen Limited  Result Date: 12/03/2018 CLINICAL DATA:  Vomiting, fever EXAM: ULTRASOUND ABDOMEN LIMITED RIGHT UPPER QUADRANT COMPARISON:  01/21/2010 FINDINGS: Gallbladder: Surgically absent Common bile duct: Diameter: 8 mm, which may be normal post cholecystectomy, previously 6 mm pre cholecystectomy. Liver: Mildly heterogeneous increased echogenicity of the liver question subtle fatty infiltration. No mass or nodularity. Portal vein is patent on color Doppler imaging with normal direction of blood flow towards the liver. Other: No RIGHT upper quadrant free fluid. IMPRESSION:  Question subtle fatty infiltration of liver. Post cholecystectomy with with 8 mm CBD, which may be physiologic post cholecystectomy; recommend correlation with LFTs. Electronically Signed   By: Lavonia Dana M.D.   On: 12/03/2018 10:44   Dg Chest Port 1 View  Result Date: 12/03/2018 CLINICAL DATA:  Fever EXAM: PORTABLE CHEST 1 VIEW COMPARISON:  January 22, 2011 FINDINGS: There is apparent consolidation in the right upper lobe. Lungs elsewhere are clear. Heart is upper normal in size with pulmonary vascularity normal. No adenopathy. No bone lesions. IMPRESSION: Consolidation right upper lobe. Suspect pneumonia. Underlying mass in this area cannot be excluded radiographically. Lungs elsewhere clear. Heart upper normal in size. No evident adenopathy. Followup PA and lateral radiographs recommended in 3-4 weeks following trial of antibiotic therapy to ensure resolution and exclude underlying malignancy. Electronically Signed   By: Lowella Grip III M.D.   On: 12/03/2018 09:30    Principal Problem:   Aspiration pneumonia (HCC) Active Problems:   ANEMIA-IRON DEFICIENCY   ERECTILE DYSFUNCTION, ORGANIC   Type 2 diabetes, uncontrolled, with neuropathy ([place])   Fatty liver   Aspiration into airway    Tye Savoy, NP-C @  12/03/2018, 1:20 PM

## 2018-12-03 NOTE — Progress Notes (Signed)
Inpatient Diabetes Program Recommendations  AACE/ADA: New Consensus Statement on Inpatient Glycemic Control (2015)  Target Ranges:  Prepandial:   less than 140 mg/dL      Peak postprandial:   less than 180 mg/dL (1-2 hours)      Critically ill patients:  140 - 180 mg/dL   Lab Results  Component Value Date   GLUCAP 276 (H) 12/03/2018   HGBA1C 8.0 (A) 01/27/2018    Review of Glycemic Control  Diabetes history: DM2 Outpatient Diabetes medications: Lantus 60 units bid, Novolog 3-30 units tidwc Current orders for Inpatient glycemic control: IV insulin then Lantus 30 units QHS  HgbA1C - pending 9/12 AG - 18   CO2 - 22  Will likely need more Lantus since pt on 60 units BID a home  Inpatient Diabetes Program Recommendations:     Criteria not met to stop insulin drip at present. When drip is discontinued, add Novolog 0-15 units tidwc and 0-5 units QHS If po intake > 50%, add Novolog 6 units tidwc  Will follow.  Thank you.  , RD, LDN, CDE Inpatient Diabetes Coordinator [phone]          

## 2018-12-03 NOTE — ED Triage Notes (Signed)
Patient states ten days ago he started feeling very weak Patient states he had a 103 fever on Tuesday and Wednesday States he can barely walk or move around  Patient is alert and oriented x4.  Short of breath when repositioning in the bed

## 2018-12-03 NOTE — ED Notes (Signed)
ED TO INPATIENT HANDOFF REPORT  ED Nurse Name and Phone #: Joice Loftsmber [phone]  S Name/Age/Gender [name] 68 y.o. male Room/Bed: WA12/WA12  Code Status   Code Status: Prior  Home/SNF/Other Home Patient oriented to: self Is this baseline? Yes   Triage Complete: Triage complete  Chief Complaint nausea,shakey,ams  Triage Note Patient states ten days ago he started feeling very weak Patient states he had a 103 fever on Tuesday and Wednesday States he can barely walk or move around  Patient is alert and oriented x4.  Short of breath when repositioning in the bed   Allergies Allergies  Allergen Reactions  . Cortisone Itching    Hives and causes blood sugar to elevate    Level of Care/Admitting Diagnosis ED Disposition    ED Disposition Condition Comment   Admit  [facility] [[id number]]  Level of Care: Telemetry [5]  Admit to tele based on following criteria: Monitor for Ischemic changes  Covid Evaluation: Confirmed COVID Negative  Diagnosis: Aspiration into airway [[id number]][[id number]]  Admitting Physician: Rhetta MuraSAMTANI, JAI-GURMUKH [phone][[id number]]  Attending Physician: Rhetta MuraSAMTANI, JAI-GURMUKH [phone][[id number]]  Estimated length of stay: 3 - 4 days  Certification:: I certify this patient will need inpatient services for at least 2 midnights  PT Class (Do Not Modify): Inpatient [101]  PT Acc Code (Do Not Modify): Private [1]       B Medical/Surgery History Past Medical History:  Diagnosis Date  . Arthritis    oa--and ra.  s/p right knee replacement on Jan 28, 2011--plans left knee replacement on 03/04/11.  also severe lower back pain-previous lumbar fusions, also pain in both hips, shoulders   . Asthma   . Diabetes mellitus   . Gallstone pancreatitis   . GERD (gastroesophageal reflux disease)   . Hepatitis    pancreatitis 2010  . Hyperlipidemia   . Hypertension    eccho 6/10, EKG 1/11, clearence Dr Caryl NeverBurchette from 11/28/10 on chart  . Peripheral vascular  disease (HCC)    states after arthroscopy left and right was given blood thinners- had symptoms of blood clot but wasnt diagnosed  . Pneumonia    2010  . Sleep apnea    study years ago/ no cpap/ states mild   Past Surgical History:  Procedure Laterality Date  . BACK SURGERY     hx of multiple back surgeries including lumbar fusion S1-L3  . CHOLECYSTECTOMY    . COLON SURGERY     removal of part of small intestine 1979  . ELBOW SURGERY     bilateral  . SHOULDER SURGERY     bilateral   . TOTAL KNEE ARTHROPLASTY  01/28/2011   Procedure: TOTAL KNEE ARTHROPLASTY;  Surgeon: Shelda PalMatthew D Olin;  Location: [facility];  Service: Orthopedics;  Laterality: Right;  femoral nerve block preop.  Marland Kitchen. TOTAL KNEE ARTHROPLASTY  03/04/2011   Procedure: TOTAL KNEE ARTHROPLASTY;  Surgeon: Shelda PalMatthew D Olin;  Location: [facility];  Service: Orthopedics;  Laterality: Left;  . TUBAL LIGATION       A IV Location/Drains/Wounds Patient Lines/Drains/Airways Status   Active Line/Drains/Airways    Name:   Placement date:   Placement time:   Site:   Days:   Peripheral IV 12/03/18 Right Antecubital   12/03/18    0811    Antecubital   less than 1   Peripheral IV 12/03/18 Left Antecubital   12/03/18    0828    Antecubital   less than 1   Closed/Suction Drain 1 Left  Knee Accordion (Hemovac)   03/04/11    0834    Knee   2831   Incision 01/28/11 Leg Right   01/28/11    0709     2866   Incision 03/04/11 Knee Left   03/04/11    0725     2831          Intake/Output Last 24 hours No intake or output data in the 24 hours ending 12/03/18 1319  Labs/Imaging Results for orders placed or performed during the hospital encounter of 12/03/18 (from the past 48 hour(s))  SARS Coronavirus 2 [facility] order, Performed in [facility] hospital lab) Nasopharyngeal Nasopharyngeal Swab     Status: None   Collection Time: 12/03/18  8:16 AM   Specimen: Nasopharyngeal Swab  Result Value Ref Range   SARS Coronavirus 2 NEGATIVE NEGATIVE    Comment:  (NOTE) If result is NEGATIVE SARS-CoV-2 target nucleic acids are NOT DETECTED. The SARS-CoV-2 RNA is generally detectable in upper and lower  respiratory specimens during the acute phase of infection. The lowest  concentration of SARS-CoV-2 viral copies this assay can detect is 250  copies / mL. A negative result does not preclude SARS-CoV-2 infection  and should not be used as the sole basis for treatment or other  patient management decisions.  A negative result may occur with  improper specimen collection / handling, submission of specimen other  than nasopharyngeal swab, presence of viral mutation(s) within the  areas targeted by this assay, and inadequate number of viral copies  (<250 copies / mL). A negative result must be combined with clinical  observations, patient history, and epidemiological information. If result is POSITIVE SARS-CoV-2 target nucleic acids are DETECTED. The SARS-CoV-2 RNA is generally detectable in upper and lower  respiratory specimens dur ing the acute phase of infection.  Positive  results are indicative of active infection with SARS-CoV-2.  Clinical  correlation with patient history and other diagnostic information is  necessary to determine patient infection status.  Positive results do  not rule out bacterial infection or co-infection with other viruses. If result is PRESUMPTIVE POSTIVE SARS-CoV-2 nucleic acids MAY BE PRESENT.   A presumptive positive result was obtained on the submitted specimen  and confirmed on repeat testing.  While 2019 novel coronavirus  (SARS-CoV-2) nucleic acids may be present in the submitted sample  additional confirmatory testing may be necessary for epidemiological  and / or clinical management purposes  to differentiate between  SARS-CoV-2 and other Sarbecovirus currently known to infect humans.  If clinically indicated additional testing with an alternate test  methodology [phone]) is advised. The SARS-CoV-2 RNA is  generally  detectable in upper and lower respiratory sp ecimens during the acute  phase of infection. The expected result is Negative. Fact Sheet for Patients:  BoilerBrush.com.cy Fact Sheet for Healthcare Providers: https://pope.com/ This test is not yet approved or cleared by the [place] FDA and has been authorized for detection and/or diagnosis of SARS-CoV-2 by FDA under an Emergency Use Authorization (EUA).  This EUA will remain in effect (meaning this test can be used) for the duration of the COVID-19 declaration under Section 564(b)(1) of the Act, 21 U.S.C. section 360bbb-3(b)(1), unless the authorization is terminated or revoked sooner. Performed at [facility], [address] [place], [place] [address]   Lactic acid, plasma     Status: None   Collection Time: 12/03/18  8:16 AM  Result Value Ref Range   Lactic Acid, Venous 1.5 0.5 -  1.9 mmol/L    Comment: Performed at [facility], [address] [place], [place] [address]  Comprehensive metabolic panel     Status: Abnormal   Collection Time: 12/03/18  8:16 AM  Result Value Ref Range   Sodium 125 (L) 135 - 145 mmol/L   Potassium 3.9 3.5 - 5.1 mmol/L   Chloride 86 (L) 98 - 111 mmol/L   CO2 22 22 - 32 mmol/L   Glucose, Bld 333 (H) 70 - 99 mg/dL   BUN 23 8 - 23 mg/dL   Creatinine, Ser 6.041.31 (H) 0.61 - 1.24 mg/dL   Calcium 8.2 (L) 8.9 - 10.3 mg/dL   Total Protein 7.3 6.5 - 8.1 g/dL   Albumin 3.0 (L) 3.5 - 5.0 g/dL   AST 540105 (H) 15 - 41 U/L   ALT 126 (H) 0 - 44 U/L   Alkaline Phosphatase 116 38 - 126 U/L   Total Bilirubin 1.5 (H) 0.3 - 1.2 mg/dL   GFR calc non Af Amer 57 (L) >60 mL/min   GFR calc Af Amer >60 >60 mL/min   Anion gap 17 (H) 5 - 15    Comment: Performed at [facility], [address] [place], [place] [address]  CBC with Differential     Status: Abnormal   Collection Time: 12/03/18  8:16 AM   Result Value Ref Range   WBC 16.1 (H) 4.0 - 10.5 K/uL   RBC 4.64 4.22 - 5.81 MIL/uL   Hemoglobin 13.9 13.0 - 17.0 g/dL   HCT 14.739.9 82.939.0 - 56.252.0 %   MCV 86.0 80.0 - 100.0 fL   MCH 30.0 26.0 - 34.0 pg   MCHC 34.8 30.0 - 36.0 g/dL   RDW 13.011.5 86.511.5 - 78.415.5 %   Platelets 480 (H) 150 - 400 K/uL   nRBC 0.0 0.0 - 0.2 %   Neutrophils Relative % 84 %   Neutro Abs 13.6 (H) 1.7 - 7.7 K/uL   Lymphocytes Relative 6 %   Lymphs Abs 0.9 0.7 - 4.0 K/uL   Monocytes Relative 7 %   Monocytes Absolute 1.1 (H) 0.1 - 1.0 K/uL   Eosinophils Relative 0 %   Eosinophils Absolute 0.0 0.0 - 0.5 K/uL   Basophils Relative 0 %   Basophils Absolute 0.0 0.0 - 0.1 K/uL   Immature Granulocytes 3 %   Abs Immature Granulocytes 0.45 (H) 0.00 - 0.07 K/uL    Comment: Performed at [facility], [address] [place], [place] [address]  Urinalysis, Routine w reflex microscopic     Status: Abnormal   Collection Time: 12/03/18  8:16 AM  Result Value Ref Range   Color, Urine YELLOW YELLOW   APPearance CLEAR CLEAR   Specific Gravity, Urine 1.013 1.005 - 1.030   pH 5.0 5.0 - 8.0   Glucose, UA >=500 (A) NEGATIVE mg/dL   Hgb urine dipstick NEGATIVE NEGATIVE   Bilirubin Urine NEGATIVE NEGATIVE   Ketones, ur 20 (A) NEGATIVE mg/dL   Protein, ur NEGATIVE NEGATIVE mg/dL   Nitrite NEGATIVE NEGATIVE   Leukocytes,Ua NEGATIVE NEGATIVE   RBC / HPF 0-5 0 - 5 RBC/hpf   WBC, UA 0-5 0 - 5 WBC/hpf   Bacteria, UA RARE (A) NONE SEEN   Squamous Epithelial / LPF 0-5 0 - 5    Comment: Performed at [facility], [address] [place], [place] [address]  Protime-INR     Status: Abnormal   Collection Time: 12/03/18  8:16 AM  Result Value Ref Range   Prothrombin Time 16.2 (H) 11.4 - 15.2 seconds   INR 1.3 (H) 0.8 - 1.2    Comment: (NOTE) INR goal varies based on device and disease states. Performed at [facility], [address] [place], [place] [address]   Blood gas, venous      Status: Abnormal   Collection Time: 12/03/18  9:40 AM  Result Value Ref Range   pH, Ven 7.444 (H) 7.250 - 7.430   pCO2, Ven 34.1 (L) 44.0 - 60.0 mmHg   pO2, Ven 53.1 (H) 32.0 - 45.0 mmHg   Bicarbonate 23.0 20.0 - 28.0 mmol/L   Acid-base deficit 0.1 0.0 - 2.0 mmol/L   O2 Saturation 86.8 %   Patient temperature 98.6    Collection site VEIN    Drawn by [id number]    Sample type VENOUS     Comment: Performed at [facility], [address] [place], [place] [address]  Lactic acid, plasma     Status: None   Collection Time: 12/03/18  9:54 AM  Result Value Ref Range   Lactic Acid, Venous 1.2 0.5 - 1.9 mmol/L    Comment: Performed at [facility], [address] [place], [place] [address]   US Abdomen Limited  Result Date: 12/03/2018 CLINICAL DATA:  Vomiting, fever EXAM: ULTRASOUND ABDOMEN LIMITED RIGHT UPPER QUADRANT COMPARISON:  01/21/2010 FINDINGS: Gallbladder: Surgically absent Common bile duct: Diameter: 8 mm, which may be normal post cholecystectomy, previously 6 mm pre cholecystectomy. Liver: Mildly heterogeneous increased echogenicity of the liver question subtle fatty infiltration. No mass or nodularity. Portal vein is patent on color Doppler imaging with normal direction of blood flow towards the liver. Other: No RIGHT upper quadrant free fluid. IMPRESSION: Question subtle fatty infiltration of liver. Post cholecystectomy with with 8 mm CBD, which may be physiologic post cholecystectomy; recommend correlation with LFTs. Electronically Signed   By: Ulyses Southward M.D.   On: 12/03/2018 10:44   Dg Chest Port 1 View  Result Date: 12/03/2018 CLINICAL DATA:  Fever EXAM: PORTABLE CHEST 1 VIEW COMPARISON:  January 22, 2011 FINDINGS: There is apparent consolidation in the right upper lobe. Lungs elsewhere are clear. Heart is upper normal in size with pulmonary vascularity normal. No adenopathy. No bone lesions. IMPRESSION: Consolidation right upper lobe. Suspect  pneumonia. Underlying mass in this area cannot be excluded radiographically. Lungs elsewhere clear. Heart upper normal in size. No evident adenopathy. Followup PA and lateral radiographs recommended in 3-4 weeks following trial of antibiotic therapy to ensure resolution and exclude underlying malignancy. Electronically Signed   By: Bretta Bang III M.D.   On: 12/03/2018 09:30    Pending Labs Unresulted Labs (From admission, onward)    Start     Ordered   12/03/18 0815  Culture, blood (Routine x 2)  BLOOD CULTURE X 2,   STAT     12/03/18 0814   12/03/18 0814  Urine culture  ONCE - STAT,   STAT     12/03/18 0814   Signed and Held  HIV antibody (Routine Testing)  Once,   R     Signed and Held   Signed and Held  CBC  (enoxaparin (LOVENOX)    CrCl < 30 ml/min)  Once,   R    Comments: Baseline for enoxaparin therapy IF NOT ALREADY DRAWN.  Notify MD if PLT < 100 K.    Signed and Held   Signed and Held  Creatinine, serum  (  enoxaparin (LOVENOX)    CrCl < 30 ml/min)  Once,   R    Comments: Baseline for enoxaparin therapy IF NOT ALREADY DRAWN.    Signed and Held   Signed and Held  Creatinine, serum  (enoxaparin (LOVENOX)    CrCl < 30 ml/min)  Weekly,   R    Comments: while on enoxaparin therapy.    Signed and Held   Signed and Held  Urine culture  Once,   R     Signed and Held   Signed and Held  Hemoglobin A1c  Tomorrow morning,   R     Signed and Held   Signed and Held  Comprehensive metabolic panel  Tomorrow morning,   R     Signed and Held   Signed and Held  CBC  Tomorrow morning,   R     Signed and Held   Signed and Held  Protime-INR  Tomorrow morning,   R     Signed and Held          Vitals/Pain Today's Vitals   12/03/18 1230 12/03/18 1245 12/03/18 1300 12/03/18 1315  BP: (!) 170/77 (!) 167/89 (!) 174/71 (!) 168/85  Pulse: 81 82 87 88  Resp: (!) 24 13 12  (!) 21  Temp:      TempSrc:      SpO2: 99% 99% 100% 100%  Weight:      Height:      PainSc:        Isolation  Precautions No active isolations  Medications Medications  acetaminophen (TYLENOL) tablet 650 mg (650 mg Oral Given 12/03/18 0755)  sodium chloride flush (NS) 0.9 % injection 3 mL (3 mLs Intravenous Given 12/03/18 0821)  ceFEPIme (MAXIPIME) 2 g in sodium chloride 0.9 % 100 mL IVPB (0 g Intravenous Stopped 12/03/18 0903)  vancomycin (VANCOCIN) 2,500 mg in sodium chloride 0.9 % 500 mL IVPB (0 mg Intravenous Stopped 12/03/18 1130)  sodium chloride 0.9 % bolus 1,000 mL (0 mLs Intravenous Stopped 12/03/18 1130)    Mobility walks with device     Focused Assessments     R Recommendations: See Admitting Provider Note  Report given to:   Additional Notes:  COVID negative

## 2018-12-03 NOTE — H&P (Addendum)
HPI  Mellody LifeGleaves B Cyran ZOX:[id number] DOB: [birth year] DOA: 12/03/2018  PCP: Kristian CoveyBurchette, Bruce W, MD   Chief Complaint: Nausea vomiting, low back pain, shortness of breath  HPI:   68 year old Caucasian male known history of severe pancreatitis in 2010, paroxysmal A. fib at that admission, fatty liver disease, DM TY 2+ neuropathy-last A1c 8.0 November 2019 Chronic pain syndrome-sees pain management Back surgery 2001, 2003 Remote history of gangrenous bowel resection 1979 asthma Left knee replacement 2012  Called PCP office 9/11 feeling nauseous having diarrhea for the past 10 days unable to keep anything down and having low back pain without dysuria found to have a low O2 sat 91 pulse of 107 and patient [facility]  Reports to me main issues are fever in addition to the above has not had much diarrhea, + abdominal pain in the back-has had UTIs last one about 6 months ago-has not kept down food in over 5 days with anorexia has not been able to control his blood sugar take his meds Does not complain of epigastric pain at present time does feel gassy  Review of Systems: Pertinent +'s: + Blurred vision double vision, + sputum which is new, + fall-last one he remembers was in June  Pertinent -"s: - Unilateral weakness, - stroke, - chest pain, - severe cough but does have sputum as above, - chills Reiger  ED Course: Patient kept n.p.o. given sodium chloride bolus 1000cc, Vanco cefepime, Tylenol, Coronavirus tested and negative   Past Medical History:  Diagnosis Date  . Arthritis    oa--and ra.  s/p right knee replacement on Jan 28, 2011--plans left knee replacement on 03/04/11.  also severe lower back pain-previous lumbar fusions, also pain in both hips, shoulders   . Asthma   . Diabetes mellitus   . Gallstone pancreatitis   . GERD (gastroesophageal reflux disease)   . Hepatitis    pancreatitis 2010  . Hyperlipidemia   . Hypertension    eccho 6/10, EKG 1/11, clearence  Dr Caryl NeverBurchette from 11/28/10 on chart  . Peripheral vascular disease (HCC)    states after arthroscopy left and right was given blood thinners- had symptoms of blood clot but wasnt diagnosed  . Pneumonia    2010  . Sleep apnea    study years ago/ no cpap/ states mild   Past Surgical History:  Procedure Laterality Date  . BACK SURGERY     hx of multiple back surgeries including lumbar fusion S1-L3  . CHOLECYSTECTOMY    . COLON SURGERY     removal of part of small intestine 1979  . ELBOW SURGERY     bilateral  . SHOULDER SURGERY     bilateral   . TOTAL KNEE ARTHROPLASTY  01/28/2011   Procedure: TOTAL KNEE ARTHROPLASTY;  Surgeon: Shelda PalMatthew D Olin;  Location: [facility] ORS;  Service: Orthopedics;  Laterality: Right;  femoral nerve block preop.  Marland Kitchen. TOTAL KNEE ARTHROPLASTY  03/04/2011   Procedure: TOTAL KNEE ARTHROPLASTY;  Surgeon: Shelda PalMatthew D Olin;  Location: [facility] ORS;  Service: Orthopedics;  Laterality: Left;  . TUBAL LIGATION      reports that he has quit smoking. His smoking use included cigars. He quit smokeless tobacco use about 27 years ago. He reports that he does not drink alcohol or use drugs.  Mobility: Somewhat independent uses a cane occasionally Told me that he does not drink or smoke but history as above seems otherwise  Allergies  Allergen Reactions  . Cortisone Itching  Hives and causes blood sugar to elevate   No family history on file. Prior to Admission medications   Medication Sig Start Date End Date Taking? Authorizing Provider  amLODipine (NORVASC) 10 MG tablet Take 1 tablet (10 mg total) by mouth daily. 01/27/18  Yes Burchette, Alinda Sierras, MD  azelastine (ASTELIN) 0.1 % nasal spray USE 2 SPRAYS IN EACH NOSTRIL TWICE A DAY AS NEEDED FOR ALLERGIES Patient taking differently: Place 2 sprays into both nostrils 2 (two) times daily as needed for allergies.  11/10/16  Yes Burchette, Alinda Sierras, MD  docusate sodium (COLACE) 100 MG capsule Take 100 mg by mouth 3 (three) times daily as  needed. Constipation    Yes [provider]  HYDROcodone-acetaminophen (NORCO) 10-325 MG per tablet Take 2 tablets every 8-12 hours 08/24/11  Yes [provider]  insulin glargine (LANTUS) 100 unit/mL SOPN Inject 60 units subcutaneous twice daily 07/27/18  Yes Burchette, Alinda Sierras, MD  lisinopril (ZESTRIL) 40 MG tablet TAKE 1 TABLET BY MOUTH EVERY DAY IN THE MORNING Patient taking differently: Take 40 mg by mouth daily.  09/21/18  Yes Burchette, Alinda Sierras, MD  methocarbamol (ROBAXIN) 500 MG tablet Take 500 mg by mouth every 8 (eight) hours as needed. 12/02/16  Yes [provider]  metoprolol succinate (TOPROL-XL) 100 MG 24 hr tablet TAKE 1 TABLET BY MOUTH EVERY MORNING Patient taking differently: Take 100 mg by mouth daily.  10/04/18  Yes Burchette, Alinda Sierras, MD  morphine (MS CONTIN) 30 MG 12 hr tablet Take 30 mg by mouth every 12 (twelve) hours.  07/03/17  Yes [provider]  NOVOLOG 100 UNIT/ML injection INJECT 3 UNITS 3 TIMES A DAY **ALSO USE SLIDING SCALE** Patient taking differently: Inject 3-30 Units into the skin 3 (three) times daily with meals. Sliding scale instructions. 07/27/18  Yes Burchette, Alinda Sierras, MD  polyethylene glycol (MIRALAX / GLYCOLAX) packet Take 17 g by mouth daily as needed. Constipation    Yes [provider]  pregabalin (LYRICA) 50 MG capsule Take 50 mg by mouth at bedtime as needed.  06/10/18  Yes [provider]  simvastatin (ZOCOR) 40 MG tablet TAKE 1 TABLET BY MOUTH EVERY DAY Patient taking differently: Take 40 mg by mouth daily.  07/06/18  Yes Burchette, Alinda Sierras, MD  fexofenadine-pseudoephedrine (ALLEGRA-D 12 HOUR) 60-120 MG per tablet Take 1 tablet by mouth 2 (two) times daily. Patient not taking: Reported on 12/03/2018 07/02/12   [name], MD  glucose blood (ONE TOUCH ULTRA TEST) test strip USE TO CHECK BLOOD SUGAR IN THE AM AND PM AS DIRECTED.  Dx E11.9 10/20/17   [name], MD  Insulin Pen Needle 31G X 5 MM  MISC Use as directed with Kara Mead 03/09/18   Burchette, Alinda Sierras, MD  Insulin Syringe-Needle U-100 (BD INSULIN SYRINGE ULTRAFINE) 31G X 5/16" 0.5 ML MISC Use daily as directed 10/27/11   [name], MD    Physical Exam:  Vitals:   12/03/18 1200 12/03/18 1215  BP: (!) 169/83 (!) 178/82  Pulse: 87 81  Resp: (!) 27 17  Temp:    SpO2: 96% 99%     Awake alert Mallampati 4 thick beard, left hearing, 2 tattoos on chest and right arm one on left  S1-S2 slightly tachycardic no murmur  Increased vocal resonance right posterior lung fields compared to left with some fremitus  Percussion note equivocal  Abdomen soft but gassy  No CVA tenderness  Range of motion intact bilateral lower  extremities upper extremities no deficit power  Bilateral knee surgery scars noted,  Unilateral right shoulder scar noted  Psych euthymic congruent pleasant  I have personally reviewed following labs and imaging studies  Labs:   Sodium 125 potassium 3.9 chloride 86 bicarb 22 BUN/creatinine 22/1.31-Baseline BUN/creatinine 14/1.04  Anion gap is 17  AST/ALT are elevated 105/126 superimposed on her normal of 64/70  Lactic acid 1.5 cyclic down to 1.2  Total bilirubin is 1.5 total protein is 7.3  Random glucose is 333  White count is 16.1 predominant left shift with neutrophilia  Urinalysis shows ketones glucose over 500 specific gravity 1.013- leukocytes negative nitrites  Imaging studies:   Ultrasound abdomen pelvis performed showed subtle fatty infiltration post cholecystectomy 8 mm CBD  Medical tests:   EKG independently reviewed: PR interval 0.08 QRS Axis 25 sinus rhythm otherwise    Test discussed with performing physician:  Yes discussed in detail with Dr. Clarene Duke plan of care agree to admit-appreciate her call to GI  Decision to obtain old records:   Yes  Review and summation of old records:   Yes extensively reviewed  Principal Problem:   Aspiration  pneumonia (HCC) Active Problems:   ANEMIA-IRON DEFICIENCY   ERECTILE DYSFUNCTION, ORGANIC   Type 2 diabetes, uncontrolled, with neuropathy (HCC)   Fatty liver   Aspiration into airway   Assessment/Plan Aspiration pneumonia Given his recurrent vomiting feel that this is the source Consolidate coverage to Unasyn at this time Repeat CXR a.m. 2 view Diarrhea, vomiting Unlikely but possibility of cholangitis Etiology unclear, ultrasound unrevealing--LFTs are marginally elevated but this could be in keeping with his volume depletion to some degree total bili is 1.5 GI to see and comment-not sure if he needs further work-up Unasyn should cover intra-abdominal pathogens DKA-probably very mild in a setting of Hypovolemic hyponatremia with metabolic acidosis and anion gap acidosis of 17 Secondary to profuse vomiting-patient might be at risk for DKA?  He has not taken his insulin properly he says in a while He has lost alkali from loss of succus because of vomiting We will try a very light diet today of clear liquids-if he vomits would hold off on any further feeds I will admit to SDU while we sort out his acidosis issues---suspect he will transition quickly with fluids 125 cc/h and RN can call for Phase 2 ord4ers Possible urinary infection although unlikely Main pain is reported bilateral CVA areas We will strain urine Obtain  urine culture--- this can masquerade as a GI issue if he has a pyelo- HTN- Holding ACE inhibitor 40 mg lisinopril, hold amlodipine, continue metoprolol XL 100 Uncontrolled diabetes mellitus As per above probably has DKA Chronic pain secondary to multiple orthopedic injuries Robaxin every 8 500 Lyrica 50 at bedtime, MS Contin 30 every 12 as well as 2 tabs Percocet every 8-would not escalate beyond the same   Severity of Illness: The appropriate patient status for this patient is INPATIENT. Inpatient status is judged to be reasonable and necessary in order to provide  the required intensity of service to ensure the patient's safety. The patient's presenting symptoms, physical exam findings, and initial radiographic and laboratory data in the context of their chronic comorbidities is felt to place them at high risk for further clinical deterioration. Furthermore, it is not anticipated that the patient will be medically stable for discharge from the hospital within 2 midnights of admission. The following factors support the patient status of inpatient.   " The patient's presenting symptoms include  fever chills. " The worrisome physical exam findings include abdominal back pain. " The initial radiographic and laboratory data are worrisome because of volume depletion lactic acidosis hyperglycemia hyponatremia. " The chronic co-morbidities include multiple.   * I certify that at the point of admission it is my clinical judgment that the patient will require inpatient hospital care spanning beyond 2 midnights from the point of admission due to high intensity of service, high risk for further deterioration and high frequency of surveillance required.*    DVT prophylaxis: Lovenox Code Status: Full Family Communication: None present Consults called: GI called by emergency room  Time spent: 65 minutes  Mahala MenghiniSamtani, MD [days-call my NP partners at night for Care related issues] [facility] --[employer]ia [employer] app OR , [email or web address]; password [email or web address]  12/03/2018, 12:40 PM

## 2018-12-03 NOTE — Progress Notes (Signed)
Pharmacy Antibiotic Note  [name] is a 68 y.o. male admitted on 12/03/2018 with aspiration PNA.  Pharmacy has been consulted for ampicillin/sulbactam dosing.  Today, 12/03/18  WBC 16.1 - elevated  SCr 1.25, CrCl ~85 mL/min  Tmax 104.2 F  Plan:  Ampicillin/sulbactam 3 g IV q6h  Check SCr with AM labs tomorrow  Height: 6\' 4"  (193 cm) Weight: 275 lb (124.7 kg) IBW/kg (Calculated) : 86.8  Temp (24hrs), Avg:100.7 F (38.2 C), Min:98.7 F (37.1 C), Max:104.2 F (40.1 C)  Recent Labs  Lab 12/03/18 0816 12/03/18 0954 12/03/18 1324  WBC 16.1*  --   --   CREATININE 1.31*  --  1.25*  LATICACIDVEN 1.5 1.2  --     Estimated Creatinine Clearance: 85 mL/min (A) (by C-G formula based on SCr of 1.25 mg/dL (H)).    Allergies  Allergen Reactions  . Cortisone Itching    Hives and causes blood sugar to elevate    Antimicrobials this admission: Ampicillin/sulbactam 9/11 >>  Cefepime and vancomycin one time doses 9/11  Dose adjustments this admission:  Microbiology results: 9/11 BCx: Sent 9/11 UCx: Sent  9/11 MRSA PCR: Sent 9/11 SARS-2: Negative  Thank you for allowing pharmacy to be a part of this patient's care.  Lenis Noon, PharmD 12/03/2018 5:05 PM

## 2018-12-03 NOTE — ED Notes (Signed)
[name] [phone] wife

## 2018-12-04 ENCOUNTER — Inpatient Hospital Stay (HOSPITAL_COMMUNITY): Payer: PPO | Admitting: Anesthesiology

## 2018-12-04 ENCOUNTER — Encounter (HOSPITAL_COMMUNITY)
Admission: EM | Disposition: A | Discharge status: Home Health | Payer: Self-pay | Source: Home / Self Care | Attending: Internal Medicine

## 2018-12-04 ENCOUNTER — Encounter (HOSPITAL_COMMUNITY): Payer: Self-pay | Admitting: Registered Nurse

## 2018-12-04 DIAGNOSIS — K3189 Other diseases of stomach and duodenum: Secondary | ICD-10-CM

## 2018-12-04 DIAGNOSIS — N179 Acute kidney failure, unspecified: Secondary | ICD-10-CM

## 2018-12-04 DIAGNOSIS — K229 Disease of esophagus, unspecified: Secondary | ICD-10-CM

## 2018-12-04 HISTORY — PX: BIOPSY: SHX5522

## 2018-12-04 HISTORY — PX: ESOPHAGOGASTRODUODENOSCOPY (EGD) WITH PROPOFOL: SHX5813

## 2018-12-04 LAB — GLUCOSE, CAPILLARY
Glucose-Capillary: 113 mg/dL — ABNORMAL HIGH (ref 70–99)
Glucose-Capillary: 137 mg/dL — ABNORMAL HIGH (ref 70–99)
Glucose-Capillary: 161 mg/dL — ABNORMAL HIGH (ref 70–99)
Glucose-Capillary: 185 mg/dL — ABNORMAL HIGH (ref 70–99)
Glucose-Capillary: 190 mg/dL — ABNORMAL HIGH (ref 70–99)
Glucose-Capillary: 215 mg/dL — ABNORMAL HIGH (ref 70–99)
Glucose-Capillary: 235 mg/dL — ABNORMAL HIGH (ref 70–99)
Glucose-Capillary: 81 mg/dL (ref 70–99)
Glucose-Capillary: 97 mg/dL (ref 70–99)

## 2018-12-04 LAB — CBC
HCT: 36.2 % — ABNORMAL LOW (ref 39.0–52.0)
Hemoglobin: 12.6 g/dL — ABNORMAL LOW (ref 13.0–17.0)
MCH: 30.1 pg (ref 26.0–34.0)
MCHC: 34.8 g/dL (ref 30.0–36.0)
MCV: 86.4 fL (ref 80.0–100.0)
Platelets: 404 10*3/uL — ABNORMAL HIGH (ref 150–400)
RBC: 4.19 MIL/uL — ABNORMAL LOW (ref 4.22–5.81)
RDW: 11.5 % (ref 11.5–15.5)
WBC: 13.4 10*3/uL — ABNORMAL HIGH (ref 4.0–10.5)
nRBC: 0 % (ref 0.0–0.2)

## 2018-12-04 LAB — COMPREHENSIVE METABOLIC PANEL
ALT: 113 U/L — ABNORMAL HIGH (ref 0–44)
AST: 112 U/L — ABNORMAL HIGH (ref 15–41)
Albumin: 2.6 g/dL — ABNORMAL LOW (ref 3.5–5.0)
Alkaline Phosphatase: 98 U/L (ref 38–126)
Anion gap: 11 (ref 5–15)
BUN: 15 mg/dL (ref 8–23)
CO2: 23 mmol/L (ref 22–32)
Calcium: 7.8 mg/dL — ABNORMAL LOW (ref 8.9–10.3)
Chloride: 96 mmol/L — ABNORMAL LOW (ref 98–111)
Creatinine, Ser: 0.93 mg/dL (ref 0.61–1.24)
GFR calc Af Amer: >60 mL/min (ref >60)
GFR calc non Af Amer: >60 mL/min (ref >60)
Glucose, Bld: 92 mg/dL (ref 70–99)
Potassium: 3.4 mmol/L — ABNORMAL LOW (ref 3.5–5.1)
Sodium: 130 mmol/L — ABNORMAL LOW (ref 135–145)
Total Bilirubin: 0.6 mg/dL (ref 0.3–1.2)
Total Protein: 6.4 g/dL — ABNORMAL LOW (ref 6.5–8.1)

## 2018-12-04 LAB — BASIC METABOLIC PANEL
Anion gap: 11 (ref 5–15)
BUN: 16 mg/dL (ref 8–23)
CO2: 23 mmol/L (ref 22–32)
Calcium: 7.7 mg/dL — ABNORMAL LOW (ref 8.9–10.3)
Chloride: 95 mmol/L — ABNORMAL LOW (ref 98–111)
Creatinine, Ser: 0.97 mg/dL (ref 0.61–1.24)
GFR calc Af Amer: >60 mL/min (ref >60)
GFR calc non Af Amer: >60 mL/min (ref >60)
Glucose, Bld: 208 mg/dL — ABNORMAL HIGH (ref 70–99)
Potassium: 3.8 mmol/L (ref 3.5–5.1)
Sodium: 129 mmol/L — ABNORMAL LOW (ref 135–145)

## 2018-12-04 LAB — PROTIME-INR
INR: 1.4 — ABNORMAL HIGH (ref 0.8–1.2)
Prothrombin Time: 16.7 seconds — ABNORMAL HIGH (ref 11.4–15.2)

## 2018-12-04 LAB — HEMOGLOBIN A1C
Hgb A1c MFr Bld: 9.4 % — ABNORMAL HIGH (ref 4.8–5.6)
Hgb A1c MFr Bld: 9.2 % — ABNORMAL HIGH (ref 4.8–5.6)
Mean Plasma Glucose: 223 mg/dL
Mean Plasma Glucose: 217.34 mg/dL

## 2018-12-04 LAB — URINE CULTURE: Culture: NO GROWTH

## 2018-12-04 LAB — HIV ANTIBODY (ROUTINE TESTING W REFLEX): HIV Screen 4th Generation wRfx: NONREACTIVE

## 2018-12-04 LAB — HEPATITIS PANEL, ACUTE
HCV Ab: <0.1 s/co ratio (ref 0.0–0.9)
Hep A IgM: NEGATIVE
Hep B C IgM: NEGATIVE
Hepatitis B Surface Ag: NEGATIVE

## 2018-12-04 SURGERY — ESOPHAGOGASTRODUODENOSCOPY (EGD) WITH PROPOFOL
Anesthesia: General

## 2018-12-04 MED ORDER — FLUCONAZOLE 200 MG PO TABS
200.0000 mg | ORAL_TABLET | Freq: Every day | ORAL | Status: DC
  Administered 2018-12-05: 200 mg via ORAL
  Filled 2018-12-04: qty 1

## 2018-12-04 MED ORDER — LACTATED RINGERS IV SOLN
INTRAVENOUS | Status: DC | PRN
  Administered 2018-12-04: 11:00:00 via INTRAVENOUS

## 2018-12-04 MED ORDER — LIDOCAINE 2% (20 MG/ML) 5 ML SYRINGE
INTRAMUSCULAR | Status: AC
  Filled 2018-12-04: qty 5

## 2018-12-04 MED ORDER — LIDOCAINE 2% (20 MG/ML) 5 ML SYRINGE
INTRAMUSCULAR | Status: DC | PRN
  Administered 2018-12-04: 50 mg via INTRAVENOUS

## 2018-12-04 MED ORDER — POTASSIUM CHLORIDE CRYS ER 20 MEQ PO TBCR
40.0000 meq | EXTENDED_RELEASE_TABLET | Freq: Once | ORAL | Status: AC
  Administered 2018-12-04: 40 meq via ORAL
  Filled 2018-12-04: qty 2

## 2018-12-04 MED ORDER — FENTANYL CITRATE (PF) 100 MCG/2ML IJ SOLN
INTRAMUSCULAR | Status: DC | PRN
  Administered 2018-12-04 (×2): 50 ug via INTRAVENOUS

## 2018-12-04 MED ORDER — ONDANSETRON HCL 4 MG/2ML IJ SOLN
INTRAMUSCULAR | Status: DC | PRN
  Administered 2018-12-04: 4 mg via INTRAVENOUS

## 2018-12-04 MED ORDER — FENTANYL CITRATE (PF) 100 MCG/2ML IJ SOLN
INTRAMUSCULAR | Status: AC
  Filled 2018-12-04: qty 2

## 2018-12-04 MED ORDER — PROPOFOL 10 MG/ML IV BOLUS
INTRAVENOUS | Status: AC
  Filled 2018-12-04: qty 20

## 2018-12-04 MED ORDER — ONDANSETRON HCL 4 MG/2ML IJ SOLN
INTRAMUSCULAR | Status: AC
  Filled 2018-12-04: qty 2

## 2018-12-04 MED ORDER — METOCLOPRAMIDE HCL 5 MG PO TABS
5.0000 mg | ORAL_TABLET | Freq: Three times a day (TID) | ORAL | Status: DC
  Administered 2018-12-04 – 2018-12-05 (×2): 5 mg via ORAL
  Filled 2018-12-04 (×2): qty 1

## 2018-12-04 MED ORDER — INSULIN ASPART 100 UNIT/ML ~~LOC~~ SOLN
0.0000 [IU] | Freq: Three times a day (TID) | SUBCUTANEOUS | Status: DC
  Administered 2018-12-04: 3 [IU] via SUBCUTANEOUS
  Administered 2018-12-04 – 2018-12-05 (×2): 5 [IU] via SUBCUTANEOUS
  Administered 2018-12-05: 3 [IU] via SUBCUTANEOUS
  Administered 2018-12-05: 8 [IU] via SUBCUTANEOUS

## 2018-12-04 MED ORDER — SUCCINYLCHOLINE CHLORIDE 200 MG/10ML IV SOSY
PREFILLED_SYRINGE | INTRAVENOUS | Status: DC | PRN
  Administered 2018-12-04: 100 mg via INTRAVENOUS

## 2018-12-04 MED ORDER — PROPOFOL 10 MG/ML IV BOLUS
INTRAVENOUS | Status: DC | PRN
  Administered 2018-12-04: 150 mg via INTRAVENOUS

## 2018-12-04 MED ORDER — FLUCONAZOLE 200 MG PO TABS
400.0000 mg | ORAL_TABLET | Freq: Once | ORAL | Status: AC
  Administered 2018-12-04: 400 mg via ORAL
  Filled 2018-12-04: qty 2

## 2018-12-04 MED ORDER — INSULIN ASPART 100 UNIT/ML ~~LOC~~ SOLN
0.0000 [IU] | Freq: Every day | SUBCUTANEOUS | Status: DC
  Administered 2018-12-04: 2 [IU] via SUBCUTANEOUS

## 2018-12-04 SURGICAL SUPPLY — 15 items

## 2018-12-04 NOTE — Interval H&P Note (Signed)
History and Physical Interval Note:  12/04/2018 11:35 AM  [name]  has presented today for surgery, with the diagnosis of nausea / vomiting.  The various methods of treatment have been discussed with the patient and family. After consideration of risks, benefits and other options for treatment, the patient has consented to  Procedure(s): ESOPHAGOGASTRODUODENOSCOPY (EGD) WITH PROPOFOL (N/A) as a surgical intervention.  The patient's history has been reviewed, patient examined, no change in status, stable for surgery.  I have reviewed the patient's chart and labs.  Questions were answered to the patient's satisfaction.     Langdon Place

## 2018-12-04 NOTE — Progress Notes (Addendum)
Nurse called to report that glucose is 80 @ 1:06am. She reported that day team had ordered lantus and clear liquid diet, but that she did not give lantus because patient was still on drip and D5W. We both agreed that patient meets criteria to come off of the drip, but that it would not be appropriate to feed this patient a full dinner (for bridging and making sure they can tolerate a diet), and give the long acting in the middle of the night  to transition. Nurse explained that they usually stop the drip and give lantus 2 hours later. I advised that this is not standard protocol. Per standard nationally recognized DKA protocol -to transition the patient from insulin drip who is been in DKA: 1.  Check electrolytes, BUN, venous pH, creatinine, and glucose every 2-4 hours until stable.   #2 after resolution of DKA when patient is able to eat, initiate subcutaneous multidose (basal-bolus) insulin regimen  #3 continue IV insulin infusion for 2 to 4 hours after subcu insulin begun to ensure adequate plasma insulin levels  #4 look for precipitating causes.  I have advised since that is unreasonable to feed this patient at 2 o'clock in the morning, we will continue the current order set and transition in the morning after breakfast.  Have advised that a carb modified diet can be ordered.  And to continue doing every 4 hours BMPs as per the current order set.  At 1:27 AM I received a page from the charge nurse, who called to report that they transition many DKA patients overnight.  He reports that they transitioned 4 last night.  I have advised that I am going to follow the national protocol, and if the patient can't eat in the middle the night, then we are not going to transition this patient.  Charge nurse continue to object, so advised that they can call the hospitalist with onsite if they have further questions.

## 2018-12-04 NOTE — Anesthesia Preprocedure Evaluation (Signed)
Anesthesia Evaluation  Patient identified by MRN, date of birth, ID band Patient awake    Reviewed: Allergy & Precautions, NPO status , Patient's Chart, lab work & pertinent test results  Airway Mallampati: II  TM Distance: >3 FB     Dental  (+) Dental Advisory Given   Pulmonary asthma , sleep apnea , pneumonia (aspiration PNA), former smoker,    breath sounds clear to auscultation       Cardiovascular hypertension, Pt. on medications and Pt. on home beta blockers + Peripheral Vascular Disease   Rhythm:Regular Rate:Normal     Neuro/Psych negative neurological ROS     GI/Hepatic GERD  ,N/V   Endo/Other  diabetes (DKA), Type 2, Insulin Dependent  Renal/GU      Musculoskeletal   Abdominal   Peds  Hematology negative hematology ROS (+)   Anesthesia Other Findings   Reproductive/Obstetrics                             Lab Results  Component Value Date   WBC 13.4 (H) 12/04/2018   HGB 12.6 (L) 12/04/2018   HCT 36.2 (L) 12/04/2018   MCV 86.4 12/04/2018   PLT 404 (H) 12/04/2018   Lab Results  Component Value Date   CREATININE 0.93 12/04/2018   BUN 15 12/04/2018   NA 130 (L) 12/04/2018   K 3.4 (L) 12/04/2018   CL 96 (L) 12/04/2018   CO2 23 12/04/2018    Anesthesia Physical Anesthesia Plan  ASA: III  Anesthesia Plan: General   Post-op Pain Management:    Induction: Intravenous and Rapid sequence  PONV Risk Score and Plan: 2 and Dexamethasone and Ondansetron  Airway Management Planned: Oral ETT  Additional Equipment:   Intra-op Plan:   Post-operative Plan: Extubation in OR  Informed Consent: I have reviewed the patients History and Physical, chart, labs and discussed the procedure including the risks, benefits and alternatives for the proposed anesthesia with the patient or authorized representative who has indicated his/her understanding and acceptance.     Dental  advisory given  Plan Discussed with: CRNA  Anesthesia Plan Comments:         Anesthesia Quick Evaluation

## 2018-12-04 NOTE — Transfer of Care (Signed)
Immediate Anesthesia Transfer of Care Note  Patient: [name]  Procedure(s) Performed: ESOPHAGOGASTRODUODENOSCOPY (EGD) WITH PROPOFOL (N/A ) BIOPSY  Patient Location: PACU  Anesthesia Type:General  Level of Consciousness: awake, alert , oriented and patient cooperative  Airway & Oxygen Therapy: Patient Spontanous Breathing and Patient connected to face mask oxygen  Post-op Assessment: Report given to RN, Post -op Vital signs reviewed and stable and Patient moving all extremities X 4  Post vital signs: stable  Last Vitals:  Vitals Value Taken Time  BP 143/76 12/04/18 1347  Temp 36.8 C 12/04/18 1347  Pulse 81 12/04/18 1347  Resp 12 12/04/18 1347  SpO2 96 % 12/04/18 1347    Last Pain:  Vitals:   12/04/18 1315  TempSrc:   PainSc: 0-No pain      Patients Stated Pain Goal: 3 (25/95/63 8756)  Complications: No apparent anesthesia complications

## 2018-12-04 NOTE — Progress Notes (Signed)
PT Cancellation Note  Patient Details Name: JAYME CHAM MRN: [id number] DOB: 01/09/1951   Cancelled Treatment:    Reason Eval/Treat Not Completed: Patient at procedure or test/unavailable. Will check back as schedule allows.   Weston Anna, PT Acute Rehabilitation Services Pager: [phone] Office: [phone]

## 2018-12-04 NOTE — Op Note (Signed)
[facility] Patient Name: [name] Procedure Date: 12/04/2018 MRN: [id number] Attending MD: Carlota Raspberry. Havery Moros , MD Date of Birth: 09/25/1950 CSN: [id number] Age: 68 Admit Type: Inpatient Procedure:                Upper GI endoscopy Indications:              refractory nausea with vomiting, recent aspiration                            pneumonia, poorly controlled diabetes, rule out                            gastric outlet obstruction Providers:                Carlota Raspberry. Havery Moros, MD, Raynelle Bring, RN, Marguerita Merles, Technician Referring MD:              Medicines:                Monitored Anesthesia Care Complications:            No immediate complications. Estimated blood loss:                            Minimal. Estimated Blood Loss:     Estimated blood loss was minimal. Procedure:                Pre-Anesthesia Assessment:                           - Prior to the procedure, a History and Physical                            was performed, and patient medications and                            allergies were reviewed. The patient's tolerance of                            previous anesthesia was also reviewed. The risks                            and benefits of the procedure and the sedation                            options and risks were discussed with the patient.                            All questions were answered, and informed consent                            was obtained. Prior Anticoagulants: The patient has                            taken no previous  anticoagulant or antiplatelet                            agents. ASA Grade Assessment: III - A patient with                            severe systemic disease. After reviewing the risks                            and benefits, the patient was deemed in                            satisfactory condition to undergo the procedure.                           After  obtaining informed consent, the endoscope was                            passed under direct vision. Throughout the                            procedure, the patient's blood pressure, pulse, and                            oxygen saturations were monitored continuously. The                            [id number]-H190 ([id number]) Olympus gastroscope was                            introduced through the mouth, and advanced to the                            second part of duodenum. The upper GI endoscopy was                            accomplished without difficulty. The patient                            tolerated the procedure well. Scope In: Scope Out: Findings:      Esophagogastric landmarks were identified: the Z-line was found at 43       cm, the gastroesophageal junction was found at 43 cm and the upper       extent of the gastric folds was found at 43 cm from the incisors.      Diffuse, white plaques were found in the entire esophagus consistent       with esophageal candidiasis.      The exam of the esophagus was otherwise normal.      Diffuse mildly erythematous mucosa was found in the entire examined       stomach without focal ulceration.. Biopsies were taken with a cold       forceps for Helicobacter pylori testing.      A small amount of food was found in the gastric body and fundus.      The  exam of the stomach was otherwise normal. No outlet obstruction,       pylorus was widely patent      The duodenal bulb and second portion of the duodenum were normal. Impression:               - Esophagogastric landmarks identified.                           - Esophageal plaques were found, consistent with                            candidiasis.                           - Erythematous mucosa in the stomach. Biopsied.                           - A small amount of food (residue) in the stomach.                           - No evidence of gastric outlet obstruction or PUD.                            - Normal duodenal bulb and second portion of the                            duodenum.                           Suspect patient could have developed gastroparesis                            in the setting of worsening diabetes, causing                            recent symptoms Moderate Sedation:      No moderate sedation, case performed with MAC Recommendation:           - Return patient to hospital ward for ongoing care.                           - Clear liquid diet okay to try                           - Continue present medications.                           - Continue protonix once daily                           - Antiemetics as needed                           - Start trial of Reglan 8m prior to each meal                           -  Start fluconazole for treatment of esophageal                            candidiasis, if no contraindications                           - Await pathology results. Procedure Code(s):        --- Professional ---                           [phone], Esophagogastroduodenoscopy, flexible,                            transoral; with biopsy, single or multiple Diagnosis Code(s):        --- Professional ---                           K22.9, Disease of esophagus, unspecified                           K31.89, Other diseases of stomach and duodenum                           R11.2, Nausea with vomiting, unspecified CPT copyright 2019 American Medical Association. All rights reserved. The codes documented in this report are preliminary and upon coder review may  be revised to meet current compliance requirements. Remo Lipps P. Armbruster, MD 12/04/2018 12:16:22 PM This report has been signed electronically. Number of Addenda: 0

## 2018-12-04 NOTE — Progress Notes (Signed)
Progress Note   Subjective  Patient feels significantly improved from admission. Was on insulin drip overnight. He has no pain. No further vomiting but has not tried eating yet. He denies chest pain or shortness of breath.   Objective   Vital signs in last 24 hours: Temp:  [98.2 F (36.8 C)-102.9 F (39.4 C)] 98.2 F (36.8 C) (09/12 0806) Pulse Rate:  [76-100] 81 (09/12 0600) Resp:  [12-38] 31 (09/12 0600) BP: (137-206)/(54-138) 153/64 (09/12 0600) SpO2:  [88 %-100 %] 98 % (09/12 0600)   General:    white male in NAD Heart:  Regular rate and rhythm; no murmurs Lungs: Respirations even and unlabored,  Abdomen:  Soft, nontender and nondistended.  Extremities:  Without edema. Neurologic:  Alert and oriented,  grossly normal neurologically. Psych:  Cooperative. Normal mood and affect.  Intake/Output from previous day: 09/11 0701 - 09/12 0700 In: 2961.3 [P.O.:240; I.V.:1935.6; IV Piggyback:785.7] Out: 1300 [Urine:1300] Intake/Output this shift: No intake/output data recorded.  Lab Results: Recent Labs    12/03/18 0816 12/04/18 0104  WBC 16.1* 13.4*  HGB 13.9 12.6*  HCT 39.9 36.2*  PLT 480* 404*   BMET Recent Labs    12/03/18 1732 12/03/18 2103 12/04/18 0104  NA 130* 128* 130*  K 3.5 3.8 3.4*  CL 90* 93* 96*  CO2 22 21* 23  GLUCOSE 205* 156* 92  BUN 18 16 15   CREATININE 1.10 1.12 0.93  CALCIUM 8.4* 8.2* 7.8*   LFT Recent Labs    12/03/18 1324 12/04/18 0104  PROT  --  6.4*  ALBUMIN  --  2.6*  AST  --  112*  ALT  --  113*  ALKPHOS  --  98  BILITOT 1.2 0.6  BILIDIR 0.3*  --   IBILI 0.9  --    PT/INR Recent Labs    12/03/18 0816 12/04/18 0104  LABPROT 16.2* 16.7*  INR 1.3* 1.4*    Studies/Results: US Abdomen Limited  Result Date: 12/03/2018 CLINICAL DATA:  Vomiting, fever EXAM: ULTRASOUND ABDOMEN LIMITED RIGHT UPPER QUADRANT COMPARISON:  01/21/2010 FINDINGS: Gallbladder: Surgically absent Common bile duct: Diameter: 8 mm, which may be  normal post cholecystectomy, previously 6 mm pre cholecystectomy. Liver: Mildly heterogeneous increased echogenicity of the liver question subtle fatty infiltration. No mass or nodularity. Portal vein is patent on color Doppler imaging with normal direction of blood flow towards the liver. Other: No RIGHT upper quadrant free fluid. IMPRESSION: Question subtle fatty infiltration of liver. Post cholecystectomy with with 8 mm CBD, which may be physiologic post cholecystectomy; recommend correlation with LFTs. Electronically Signed   By: Lavonia Dana M.D.   On: 12/03/2018 10:44   Dg Chest Port 1 View  Result Date: 12/03/2018 CLINICAL DATA:  Fever EXAM: PORTABLE CHEST 1 VIEW COMPARISON:  January 22, 2011 FINDINGS: There is apparent consolidation in the right upper lobe. Lungs elsewhere are clear. Heart is upper normal in size with pulmonary vascularity normal. No adenopathy. No bone lesions. IMPRESSION: Consolidation right upper lobe. Suspect pneumonia. Underlying mass in this area cannot be excluded radiographically. Lungs elsewhere clear. Heart upper normal in size. No evident adenopathy. Followup PA and lateral radiographs recommended in 3-4 weeks following trial of antibiotic therapy to ensure resolution and exclude underlying malignancy. Electronically Signed   By: Lowella Grip III M.D.   On: 12/03/2018 09:30       Assessment / Plan:    68 y/o male with DM, history of cholecystectomy, fatty liver, several days of  nausea / vomiting, admitted with worsening nausea / vomiting, aspiration pneumonia and hyponatremia. LAEs are slightly elevated from baseline but he has had chronic mild elevations over time. Bili was slightly elevated but mostly indirect. He has not had any pain, CBD within normal limits post cholecystectomy, I think unlikely he has a biliary source for his symptoms. We have sent some additional serologies to workup for chronic liver disease but ALT elevation has been chronic over time,  suspect due to fatty liver. Ferritin noted to be high but suspect acute phase reactant from his pneumonia, iron sat is actually low. Would repeat ferritin once he is feeling better.   In regards to his nausea / vomiting, unclear etiology. Was given empiric PPI to cover for PUD, although gastroparesis is possible in the setting of poorly controlled diabetes. I do think he warrants EGD to clear his upper tract. We discussed risks / benefits of this and he wanted to proceed. I hope we can do it today pending anesthesia availability. If not, will do it tomorrow. From a respiratory standpoint he is doing quite well, WBC downtrended on antibiotics.  Recommend: - continue NPO for now, hopefully EGD today pending anesthesia availability, if not, tomorrow - continue antiemetics, empiric PPI - antibiotics for pneumonia - trend LFTs - again this appears chronic, likely due to fatty liver  Call with questions.  Fleda Pagel, MD [facility]    

## 2018-12-04 NOTE — Progress Notes (Signed)
PROGRESS NOTE    [name]  ZHY:[id number] DOB: Jul 27, 1950 DOA: 12/03/2018 PCP: Kristian CoveyBurchette, Bruce W, MD    Brief Narrative:   68 year old Caucasian male known history of severe pancreatitis in 2010, paroxysmal A. fib at that admission, fatty liver disease, DM TY 2+ neuropathy-last A1c 8.0 November 2019 Chronic pain syndrome-sees pain management Back surgery 2001, 2003 Remote history of gangrenous bowel resection 1979 asthma Left knee replacement 2012  Called PCP office 9/11 feeling nauseous having diarrhea for the past 10 days unable to keep anything down and having low back pain without dysuria found to have a low O2 sat 91 pulse of 107 and [facility]  Reports to me main issues are fever in addition to the above has not had much diarrhea, + abdominal pain in the back-has had UTIs last one about 6 months ago-has not kept down food in over 5 days with anorexia has not been able to control his blood sugar take his meds Does not complain of epigastric pain at present time does feel gassy  Assessment & Plan:   Principal Problem:   Aspiration pneumonia (HCC) Active Problems:   ANEMIA-IRON DEFICIENCY   ERECTILE DYSFUNCTION, ORGANIC   Type 2 diabetes, uncontrolled, with neuropathy (HCC)   Fatty liver   Aspiration into airway   DKA (diabetic ketoacidoses) (HCC)  Aspiration pneumonia -CXR personally reviewed with findings of RUL consolidation -Pt is continued on Unasyn  -Currently afebrile. Presenting WBC of 16.1k -WBC improvedto 13k today -currently on 2LNC. Continue to wean as tolerated -repeat cbc in AM  Nausea/vomiting -GI consulted, appreciate input -EGD results reviewed. Findings of esophageal candidiasis and erythematous stomach with biopsies taken -GI recommendation for trial of antifungal, reglan before each meal, and PPI  DKA-probably very mild in a setting of Hypovolemic hyponatremia with metabolic acidosis and anion gap acidosis of 17  at presentation -DKA resolved overnight with IVF and insulin gtt -Pt now on subq insulin with SSI coverage -Cont to titrate insulin as needed -Hgba1c of 9.2 -repeat bmet in AM  UTI ruled out -Urinalysis reviewed, clear. Not suggestive of UIT -Urine cx is pending, ordered at time of presentation  HTN- -Vital signs reviewed -BP stable at present -Currently continued on metoprolol -Holding 40 mg lisinopril, hold amlodipine  Chronic pain secondary to multiple orthopedic injuries -On Robaxin every 8 500 Lyrica 50 at bedtime, MS Contin 30 every 12 as well as 2 tabs Percocet every 8-would not escalate beyond the same -Seems stable at this time  DVT prophylaxis: Lovenox subQ Code Status: DNR Family Communication: Pt in room, family not at bedside Disposition Plan: Uncertain at this time  Consultants:   GI  Procedures:   EGD 9/12  Antimicrobials: Anti-infectives (From admission, onward)   Start     Dose/Rate Route Frequency Ordered Stop   12/05/18 1000  fluconazole (DIFLUCAN) tablet 200 mg     200 mg Oral Daily 12/04/18 1219     12/04/18 1500  fluconazole (DIFLUCAN) tablet 400 mg     400 mg Oral  Once 12/04/18 1219     12/03/18 1800  Ampicillin-Sulbactam (UNASYN) 3 g in sodium chloride 0.9 % 100 mL IVPB     3 g 200 mL/hr over 30 Minutes Intravenous Every 6 hours 12/03/18 1704     12/03/18 0830  vancomycin (VANCOCIN) 2,500 mg in sodium chloride 0.9 % 500 mL IVPB     2,500 mg 250 mL/hr over 120 Minutes Intravenous  Once 12/03/18 0828 12/03/18  1130   12/03/18 0815  vancomycin (VANCOCIN) IVPB 1000 mg/200 mL premix  Status:  Discontinued     1,000 mg 200 mL/hr over 60 Minutes Intravenous  Once 12/03/18 0814 12/03/18 0828   12/03/18 0815  ceFEPIme (MAXIPIME) 2 g in sodium chloride 0.9 % 100 mL IVPB     2 g 200 mL/hr over 30 Minutes Intravenous  Once 12/03/18 0814 12/03/18 0903       Subjective: Reports feeling "200% better" this AM  Objective: Vitals:   12/04/18 1245  12/04/18 1300 12/04/18 1315 12/04/18 1347  BP: 121/67 121/68 117/89 (!) 143/76  Pulse: 77 78 79 81  Resp: 17 12 17 12   Temp:   97.7 F (36.5 C) 98.3 F (36.8 C)  TempSrc:      SpO2: 97% 97% 96% 96%  Weight:    119 kg  Height:    6\' 4"  (1.93 m)    Intake/Output Summary (Last 24 hours) at 12/04/2018 1705 Last data filed at 12/04/2018 1315 Gross per 24 hour  Intake 2717.79 ml  Output 1775 ml  Net 942.79 ml   Filed Weights   12/03/18 0745 12/04/18 1347  Weight: 124.7 kg 119 kg    Examination:  General exam: Appears calm and comfortable  Respiratory system: Clear to auscultation. Respiratory effort normal. Cardiovascular system: S1 & S2 heard, RRR Gastrointestinal system: Abdomen is nondistended, soft and nontender. No organomegaly or masses felt. Normal bowel sounds heard. Central nervous system: Alert and oriented. No focal neurological deficits. Extremities: Symmetric 5 x 5 power. Skin: No rashes, lesions  Psychiatry: Judgement and insight appear normal. Mood & affect appropriate.   Data Reviewed: I have personally reviewed following labs and imaging studies  CBC: Recent Labs  Lab 12/03/18 0816 12/04/18 0104  WBC 16.1* 13.4*  NEUTROABS 13.6*  --   HGB 13.9 12.6*  HCT 39.9 36.2*  MCV 86.0 86.4  PLT 480* 404*   Basic Metabolic Panel: Recent Labs  Lab 12/03/18 1324 12/03/18 1732 12/03/18 2103 12/04/18 0104 12/04/18 0928  NA 125* 130* 128* 130* 129*  K 4.2 3.5 3.8 3.4* 3.8  CL 86* 90* 93* 96* 95*  CO2 21* 22 21* 23 23  GLUCOSE 322* 205* 156* 92 208*  BUN 22 18 16 15 16   CREATININE 1.25* 1.10 1.12 0.93 0.97  CALCIUM 8.3* 8.4* 8.2* 7.8* 7.7*   GFR: Estimated Creatinine Clearance: 107.1 mL/min (by C-G formula based on SCr of 0.97 mg/dL). Liver Function Tests: Recent Labs  Lab 12/03/18 0816 12/03/18 1324 12/04/18 0104  AST 105*  --  112*  ALT 126*  --  113*  ALKPHOS 116  --  98  BILITOT 1.5* 1.2 0.6  PROT 7.3  --  6.4*  ALBUMIN 3.0*  --  2.6*   No  results for input(s): LIPASE, AMYLASE in the last 168 hours. No results for input(s): AMMONIA in the last 168 hours. Coagulation Profile: Recent Labs  Lab 12/03/18 0816 12/04/18 0104  INR 1.3* 1.4*   Cardiac Enzymes: No results for input(s): CKTOTAL, CKMB, CKMBINDEX, TROPONINI in the last 168 hours. BNP (last 3 results) No results for input(s): PROBNP in the last 8760 hours. HbA1C: Recent Labs    12/03/18 1324 12/04/18 0104  HGBA1C 9.4* 9.2*   CBG: Recent Labs  Lab 12/04/18 0401 12/04/18 0735 12/04/18 1144 12/04/18 1242 12/04/18 1623  GLUCAP 113* 190* 185* 161* 215*   Lipid Profile: No results for input(s): CHOL, HDL, LDLCALC, TRIG, CHOLHDL, LDLDIRECT in the last 72 hours.  Thyroid Function Tests: No results for input(s): TSH, T4TOTAL, FREET4, T3FREE, THYROIDAB in the last 72 hours. Anemia Panel: Recent Labs    12/03/18 0850  FERRITIN 1,270*  TIBC 189*  IRON 19*   Sepsis Labs: Recent Labs  Lab 12/03/18 0816 12/03/18 0954  LATICACIDVEN 1.5 1.2    Recent Results (from the past 240 hour(s))  SARS Coronavirus 2 [facility] order, Performed in [facility] hospital lab) Nasopharyngeal Nasopharyngeal Swab     Status: None   Collection Time: 12/03/18  8:16 AM   Specimen: Nasopharyngeal Swab  Result Value Ref Range Status   SARS Coronavirus 2 NEGATIVE NEGATIVE Final    Comment: (NOTE) If result is NEGATIVE SARS-CoV-2 target nucleic acids are NOT DETECTED. The SARS-CoV-2 RNA is generally detectable in upper and lower  respiratory specimens during the acute phase of infection. The lowest  concentration of SARS-CoV-2 viral copies this assay can detect is 250  copies / mL. A negative result does not preclude SARS-CoV-2 infection  and should not be used as the sole basis for treatment or other  patient management decisions.  A negative result may occur with  improper specimen collection / handling, submission of specimen other  than nasopharyngeal swab, presence of  viral mutation(s) within the  areas targeted by this assay, and inadequate number of viral copies  (<250 copies / mL). A negative result must be combined with clinical  observations, patient history, and epidemiological information. If result is POSITIVE SARS-CoV-2 target nucleic acids are DETECTED. The SARS-CoV-2 RNA is generally detectable in upper and lower  respiratory specimens dur ing the acute phase of infection.  Positive  results are indicative of active infection with SARS-CoV-2.  Clinical  correlation with patient history and other diagnostic information is  necessary to determine patient infection status.  Positive results do  not rule out bacterial infection or co-infection with other viruses. If result is PRESUMPTIVE POSTIVE SARS-CoV-2 nucleic acids MAY BE PRESENT.   A presumptive positive result was obtained on the submitted specimen  and confirmed on repeat testing.  While 2019 novel coronavirus  (SARS-CoV-2) nucleic acids may be present in the submitted sample  additional confirmatory testing may be necessary for epidemiological  and / or clinical management purposes  to differentiate between  SARS-CoV-2 and other Sarbecovirus currently known to infect humans.  If clinically indicated additional testing with an alternate test  methodology [id number]) is advised. The SARS-CoV-2 RNA is generally  detectable in upper and lower respiratory sp ecimens during the acute  phase of infection. The expected result is Negative. Fact Sheet for Patients:  BoilerBrush.com.cy Fact Sheet for Healthcare Providers: https://pope.com/ This test is not yet approved or cleared by the [place] FDA and has been authorized for detection and/or diagnosis of SARS-CoV-2 by FDA under an Emergency Use Authorization (EUA).  This EUA will remain in effect (meaning this test can be used) for the duration of the COVID-19 declaration under Section  564(b)(1) of the Act, 21 U.S.C. section 360bbb-3(b)(1), unless the authorization is terminated or revoked sooner. Performed at [facility], [address], [place], [place] [address]   Culture, blood (Routine x 2)     Status: None (Preliminary result)   Collection Time: 12/03/18  8:16 AM   Specimen: BLOOD  Result Value Ref Range Status   Specimen Description   Final    BLOOD RIGHT ANTECUBITAL Performed at [facility], [address], [place], [place] [address]    Special Requests  Final    BOTTLES DRAWN AEROBIC AND ANAEROBIC Blood Culture results may not be optimal due to an inadequate volume of blood received in culture bottles Performed at [facility], [address] [place], [place] [address]    Culture   Final    NO GROWTH < 24 HOURS Performed at [facility], [address], [place], [place] [address]    Report Status PENDING  Incomplete  Culture, blood (Routine x 2)     Status: None (Preliminary result)   Collection Time: 12/03/18  8:21 AM   Specimen: BLOOD  Result Value Ref Range Status   Specimen Description   Final    BLOOD LEFT ANTECUBITAL Performed at [facility], [address] [place], [place] [address]    Special Requests   Final    BOTTLES DRAWN AEROBIC AND ANAEROBIC Blood Culture adequate volume Performed at [facility], [address] [place], [place] [address]    Culture   Final    NO GROWTH < 24 HOURS Performed at [facility], [address], [place], [place] [address]    Report Status PENDING  Incomplete  Urine culture     Status: None   Collection Time: 12/03/18 12:01 PM   Specimen: In/Out Cath Urine  Result Value Ref Range Status   Specimen Description   Final    IN/OUT CATH URINE Performed at [facility], [address] [place], [place] [address]    Special Requests   Final    NONE Performed at [facility], [address], [place], [place] [address]    Culture   Final    NO GROWTH Performed at [facility], [address] [place], [place] [address]    Report Status 12/04/2018 FINAL  Final  MRSA PCR Screening     Status: None   Collection Time: 12/03/18  3:19 PM   Specimen: Nasal Mucosa; Nasopharyngeal  Result Value Ref Range Status   MRSA by PCR NEGATIVE NEGATIVE Final    Comment:        The GeneXpert MRSA Assay (FDA approved for NASAL specimens only), is one component of a comprehensive MRSA colonization surveillance program. It is not intended to diagnose MRSA infection nor to guide or monitor treatment for MRSA infections. Performed at [facility], [address], [place], [place] [address]      Radiology Studies: US Abdomen Limited  Result Date: 12/03/2018 CLINICAL DATA:  Vomiting, fever EXAM: ULTRASOUND ABDOMEN LIMITED RIGHT UPPER QUADRANT COMPARISON:  01/21/2010 FINDINGS: Gallbladder: Surgically absent Common bile duct: Diameter: 8 mm, which may be normal post cholecystectomy, previously 6 mm pre cholecystectomy. Liver: Mildly heterogeneous increased echogenicity of the liver question subtle fatty infiltration. No mass or nodularity. Portal vein is patent on color Doppler imaging with normal direction of blood flow towards the liver. Other: No RIGHT upper quadrant free fluid. IMPRESSION: Question subtle fatty infiltration of liver. Post cholecystectomy with with 8 mm CBD, which may be physiologic post cholecystectomy; recommend correlation with LFTs. Electronically Signed   By: Ulyses Southward M.D.   On: 12/03/2018 10:44   Dg Chest Port 1 View  Result Date: 12/03/2018 CLINICAL DATA:  Fever EXAM: PORTABLE CHEST 1 VIEW COMPARISON:  January 22, 2011 FINDINGS: There is apparent consolidation in the right upper lobe. Lungs elsewhere are clear. Heart is upper normal in size with pulmonary vascularity normal. No adenopathy. No bone lesions.  IMPRESSION:  Consolidation right upper lobe. Suspect pneumonia. Underlying mass in this area cannot be excluded radiographically. Lungs elsewhere clear. Heart upper normal in size. No evident adenopathy. Followup PA and lateral radiographs recommended in 3-4 weeks following trial of antibiotic therapy to ensure resolution and exclude underlying malignancy. Electronically Signed   By: Lowella Grip III M.D.   On: 12/03/2018 09:30    Scheduled Meds: . Chlorhexidine Gluconate Cloth  6 each Topical Daily  . enoxaparin (LOVENOX) injection  60 mg Subcutaneous Q24H  . [START ON 12/05/2018] fluconazole  200 mg Oral Daily  . fluconazole  400 mg Oral Once  . insulin aspart  0-15 Units Subcutaneous TID WC  . insulin aspart  0-5 Units Subcutaneous QHS  . insulin glargine  30 Units Subcutaneous Q2200  . loratadine  10 mg Oral Daily  . mouth rinse  15 mL Mouth Rinse BID  . metoCLOPramide  5 mg Oral TID AC  . metoprolol succinate  100 mg Oral Daily  . morphine  30 mg Oral Q12H  . ondansetron  4 mg Intravenous TID  . pantoprazole (PROTONIX) IV  40 mg Intravenous Q24H  . pregabalin  50 mg Oral Daily  . sodium chloride flush  3 mL Intravenous Q12H   Continuous Infusions: . ampicillin-sulbactam (UNASYN) IV Stopped (12/04/18 0631)  . dextrose 5 % and 0.45% NaCl 75 mL/hr at 12/04/18 1425     LOS: 1 day   Marylu Lund, MD [facility] Pager On Amion  If 7PM-7AM, please contact night-coverage 12/04/2018, 5:05 PM

## 2018-12-04 NOTE — Evaluation (Signed)
Physical Therapy Evaluation Patient Details Name: [name] MRN: [id number] DOB: [birth year] Today's Date: 12/04/2018   History of Present Illness  68 yo male admitted with asp pna, diarrhea, vomiting, fever. Hx of A fib, back sg, bil TKA, chronic pain, neuropathy, DM, PVD  Clinical Impression  On eval, pt required Min guard-Min assist for mobility. He walked ~120 feet with a RW. Pt c/o moderate pain in bil LEs. Will continue to follow and progress activity as tolerated.     Follow Up Recommendations Home health PT;Supervision/Assistance - 24 hour    Equipment Recommendations  None recommended by PT    Recommendations for Other Services       Precautions / Restrictions Precautions Precautions: Fall Restrictions Weight Bearing Restrictions: No      Mobility  Bed Mobility Overal bed mobility: Needs Assistance Bed Mobility: Sit to Supine;Supine to Sit     Supine to sit: Min guard;HOB elevated Sit to supine: Min assist;HOB elevated   General bed mobility comments: Assist for LEs  Transfers Overall transfer level: Needs assistance Equipment used: Rolling walker (2 wheeled) Transfers: Sit to/from Stand Sit to Stand: Min guard;From elevated surface         General transfer comment: close guard for safety. vcs safety, hand placement  Ambulation/Gait Ambulation/Gait assistance: Min guard Gait Distance (Feet): 120 Feet Assistive device: Rolling walker (2 wheeled) Gait Pattern/deviations: Step-through pattern;Decreased stride length;Decreased step length - right;Decreased step length - left     General Gait Details: slow gait speed. unsteady at times. dyspnea 2/4. O2>90% on RA  Stairs            Wheelchair Mobility    Modified Rankin (Stroke Patients Only)       Balance Overall balance assessment: Needs assistance         Standing balance support: Bilateral upper extremity supported Standing balance-Leahy Scale: Poor                                Pertinent Vitals/Pain Pain Assessment: 0-10 Pain Score: 8  Pain Location: bil LEs Pain Descriptors / Indicators: Discomfort;Sore Pain Intervention(s): Monitored during session    Home Living Family/patient expects to be discharged to:: Private residence Living Arrangements: Spouse/significant other Available Help at Discharge: Family Type of Home: House Home Access: Stairs to enter Entrance Stairs-Rails: Right Entrance Stairs-Number of Steps: 5 Home Layout: One level Home Equipment: Environmental consultant - 2 wheels;Walker - 4 wheels;Cane - single point;Bedside commode;Shower seat      Prior Function Level of Independence: Independent with assistive device(s)         Comments: uses cane     Hand Dominance        Extremity/Trunk Assessment   Upper Extremity Assessment Upper Extremity Assessment: Overall WFL for tasks assessed    Lower Extremity Assessment Lower Extremity Assessment: Generalized weakness    Cervical / Trunk Assessment Cervical / Trunk Assessment: Normal  Communication   Communication: No difficulties  Cognition Arousal/Alertness: Awake/alert Behavior During Therapy: WFL for tasks assessed/performed Overall Cognitive Status: Within Functional Limits for tasks assessed                                        General Comments      Exercises     Assessment/Plan    PT Assessment Patient needs continued PT services  PT Problem  List Decreased strength;Decreased mobility;Decreased activity tolerance;Decreased balance;Decreased knowledge of use of DME       PT Treatment Interventions DME instruction;Gait training;Therapeutic exercise;Therapeutic activities;Patient/family education;Functional mobility training;Balance training    PT Goals (Current goals can be found in the Care Plan section)  Acute Rehab PT Goals Patient Stated Goal: less pain PT Goal Formulation: With patient Time For Goal Achievement:  12/18/18 Potential to Achieve Goals: Good    Frequency Min 3X/week   Barriers to discharge        Co-evaluation               AM-PAC PT "6 Clicks" Mobility  Outcome Measure Help needed turning from your back to your side while in a flat bed without using bedrails?: A Little Help needed moving from lying on your back to sitting on the side of a flat bed without using bedrails?: A Little Help needed moving to and from a bed to a chair (including a wheelchair)?: A Little Help needed standing up from a chair using your arms (e.g., wheelchair or bedside chair)?: A Little Help needed to walk in hospital room?: A Little Help needed climbing 3-5 steps with a railing? : A Little 6 Click Score: 18    End of Session Equipment Utilized During Treatment: Gait belt Activity Tolerance: Patient tolerated treatment well Patient left: in bed;with call bell/phone within reach;with bed alarm set;with nursing/sitter in room   PT Visit Diagnosis: Unsteadiness on feet (R26.81);Muscle weakness (generalized) (M62.81)    Time: 1610-9604 PT Time Calculation (min) (ACUTE ONLY): 38 min   Charges:   PT Evaluation $PT Eval Moderate Complexity: 1 Mod PT Treatments $Gait Training: 8-22 mins         Rebeca Alert, PT Acute Rehabilitation Services Pager: [phone] Office: [phone]

## 2018-12-04 NOTE — Anesthesia Procedure Notes (Signed)
Procedure Name: Intubation Date/Time: 12/04/2018 11:49 AM Performed by: Lissa Morales, CRNA Pre-anesthesia Checklist: Patient identified, Emergency Drugs available, Suction available and Patient being monitored Patient Re-evaluated:Patient Re-evaluated prior to induction Oxygen Delivery Method: Circle system utilized Preoxygenation: Pre-oxygenation with 100% oxygen Induction Type: IV induction, Cricoid Pressure applied and Rapid sequence Laryngoscope Size: Mac, 4 and Glidescope Grade View: Grade II Tube type: Oral Tube size: 8.0 mm Number of attempts: 1 Airway Equipment and Method: Stylet and Oral airway Placement Confirmation: ETT inserted through vocal cords under direct vision,  positive ETCO2 and breath sounds checked- equal and bilateral Secured at: 22 cm Tube secured with: Tape Dental Injury: Teeth and Oropharynx as per pre-operative assessment

## 2018-12-04 NOTE — H&P (View-Only) (Signed)
Progress Note   Subjective  Patient feels significantly improved from admission. Was on insulin drip overnight. He has no pain. No further vomiting but has not tried eating yet. He denies chest pain or shortness of breath.   Objective   Vital signs in last 24 hours: Temp:  [98.2 F (36.8 C)-102.9 F (39.4 C)] 98.2 F (36.8 C) (09/12 0806) Pulse Rate:  [76-100] 81 (09/12 0600) Resp:  [12-38] 31 (09/12 0600) BP: (137-206)/(54-138) 153/64 (09/12 0600) SpO2:  [88 %-100 %] 98 % (09/12 0600)   General:    white male in NAD Heart:  Regular rate and rhythm; no murmurs Lungs: Respirations even and unlabored,  Abdomen:  Soft, nontender and nondistended.  Extremities:  Without edema. Neurologic:  Alert and oriented,  grossly normal neurologically. Psych:  Cooperative. Normal mood and affect.  Intake/Output from previous day: 09/11 0701 - 09/12 0700 In: 2961.3 [P.O.:240; I.V.:1935.6; IV Piggyback:785.7] Out: 1300 [Urine:1300] Intake/Output this shift: No intake/output data recorded.  Lab Results: Recent Labs    12/03/18 0816 12/04/18 0104  WBC 16.1* 13.4*  HGB 13.9 12.6*  HCT 39.9 36.2*  PLT 480* 404*   BMET Recent Labs    12/03/18 1732 12/03/18 2103 12/04/18 0104  NA 130* 128* 130*  K 3.5 3.8 3.4*  CL 90* 93* 96*  CO2 22 21* 23  GLUCOSE 205* 156* 92  BUN 18 16 15   CREATININE 1.10 1.12 0.93  CALCIUM 8.4* 8.2* 7.8*   LFT Recent Labs    12/03/18 1324 12/04/18 0104  PROT  --  6.4*  ALBUMIN  --  2.6*  AST  --  112*  ALT  --  113*  ALKPHOS  --  98  BILITOT 1.2 0.6  BILIDIR 0.3*  --   IBILI 0.9  --    PT/INR Recent Labs    12/03/18 0816 12/04/18 0104  LABPROT 16.2* 16.7*  INR 1.3* 1.4*    Studies/Results: US Abdomen Limited  Result Date: 12/03/2018 CLINICAL DATA:  Vomiting, fever EXAM: ULTRASOUND ABDOMEN LIMITED RIGHT UPPER QUADRANT COMPARISON:  01/21/2010 FINDINGS: Gallbladder: Surgically absent Common bile duct: Diameter: 8 mm, which may be  normal post cholecystectomy, previously 6 mm pre cholecystectomy. Liver: Mildly heterogeneous increased echogenicity of the liver question subtle fatty infiltration. No mass or nodularity. Portal vein is patent on color Doppler imaging with normal direction of blood flow towards the liver. Other: No RIGHT upper quadrant free fluid. IMPRESSION: Question subtle fatty infiltration of liver. Post cholecystectomy with with 8 mm CBD, which may be physiologic post cholecystectomy; recommend correlation with LFTs. Electronically Signed   By: Lavonia Dana M.D.   On: 12/03/2018 10:44   Dg Chest Port 1 View  Result Date: 12/03/2018 CLINICAL DATA:  Fever EXAM: PORTABLE CHEST 1 VIEW COMPARISON:  January 22, 2011 FINDINGS: There is apparent consolidation in the right upper lobe. Lungs elsewhere are clear. Heart is upper normal in size with pulmonary vascularity normal. No adenopathy. No bone lesions. IMPRESSION: Consolidation right upper lobe. Suspect pneumonia. Underlying mass in this area cannot be excluded radiographically. Lungs elsewhere clear. Heart upper normal in size. No evident adenopathy. Followup PA and lateral radiographs recommended in 3-4 weeks following trial of antibiotic therapy to ensure resolution and exclude underlying malignancy. Electronically Signed   By: Lowella Grip III M.D.   On: 12/03/2018 09:30       Assessment / Plan:    68 y/o male with DM, history of cholecystectomy, fatty liver, several days of  nausea / vomiting, admitted with worsening nausea / vomiting, aspiration pneumonia and hyponatremia. LAEs are slightly elevated from baseline but he has had chronic mild elevations over time. Bili was slightly elevated but mostly indirect. He has not had any pain, CBD within normal limits post cholecystectomy, I think unlikely he has a biliary source for his symptoms. We have sent some additional serologies to workup for chronic liver disease but ALT elevation has been chronic over time,  suspect due to fatty liver. Ferritin noted to be high but suspect acute phase reactant from his pneumonia, iron sat is actually low. Would repeat ferritin once he is feeling better.   In regards to his nausea / vomiting, unclear etiology. Was given empiric PPI to cover for PUD, although gastroparesis is possible in the setting of poorly controlled diabetes. I do think he warrants EGD to clear his upper tract. We discussed risks / benefits of this and he wanted to proceed. I hope we can do it today pending anesthesia availability. If not, will do it tomorrow. From a respiratory standpoint he is doing quite well, WBC downtrended on antibiotics.  Recommend: - continue NPO for now, hopefully EGD today pending anesthesia availability, if not, tomorrow - continue antiemetics, empiric PPI - antibiotics for pneumonia - trend LFTs - again this appears chronic, likely due to fatty liver  Call with questions.  Ileene PatrickSteven , MD [facility]

## 2018-12-04 NOTE — Anesthesia Postprocedure Evaluation (Signed)
Anesthesia Post Note  Patient: [name]  Procedure(s) Performed: ESOPHAGOGASTRODUODENOSCOPY (EGD) WITH PROPOFOL (N/A ) BIOPSY     Patient location during evaluation: PACU Anesthesia Type: General Level of consciousness: awake and alert Pain management: pain level controlled Vital Signs Assessment: post-procedure vital signs reviewed and stable Respiratory status: spontaneous breathing, nonlabored ventilation, respiratory function stable and patient connected to nasal cannula oxygen Cardiovascular status: blood pressure returned to baseline and stable Postop Assessment: no apparent nausea or vomiting Anesthetic complications: no    Last Vitals:  Vitals:   12/04/18 1315 12/04/18 1347  BP: 117/89 (!) 143/76  Pulse: 79 81  Resp: 17 12  Temp: 36.5 C 36.8 C  SpO2: 96% 96%    Last Pain:  Vitals:   12/04/18 1315  TempSrc:   PainSc: 0-No pain                 Tiajuana Amass

## 2018-12-05 ENCOUNTER — Other Ambulatory Visit: Payer: Self-pay | Admitting: Gastroenterology

## 2018-12-05 DIAGNOSIS — R748 Abnormal levels of other serum enzymes: Secondary | ICD-10-CM

## 2018-12-05 DIAGNOSIS — N529 Male erectile dysfunction, unspecified: Secondary | ICD-10-CM

## 2018-12-05 LAB — BASIC METABOLIC PANEL
Anion gap: 14 (ref 5–15)
BUN: 11 mg/dL (ref 8–23)
CO2: 25 mmol/L (ref 22–32)
Calcium: 8 mg/dL — ABNORMAL LOW (ref 8.9–10.3)
Chloride: 94 mmol/L — ABNORMAL LOW (ref 98–111)
Creatinine, Ser: 0.94 mg/dL (ref 0.61–1.24)
GFR calc Af Amer: >60 mL/min (ref >60)
GFR calc non Af Amer: >60 mL/min (ref >60)
Glucose, Bld: 230 mg/dL — ABNORMAL HIGH (ref 70–99)
Potassium: 3.9 mmol/L (ref 3.5–5.1)
Sodium: 133 mmol/L — ABNORMAL LOW (ref 135–145)

## 2018-12-05 LAB — HEPATIC FUNCTION PANEL
ALT: 207 U/L — ABNORMAL HIGH (ref 0–44)
AST: 227 U/L — ABNORMAL HIGH (ref 15–41)
Albumin: 2.4 g/dL — ABNORMAL LOW (ref 3.5–5.0)
Alkaline Phosphatase: 327 U/L — ABNORMAL HIGH (ref 38–126)
Bilirubin, Direct: 0.4 mg/dL — ABNORMAL HIGH (ref 0.0–0.2)
Indirect Bilirubin: 1 mg/dL — ABNORMAL HIGH (ref 0.3–0.9)
Total Bilirubin: 1.4 mg/dL — ABNORMAL HIGH (ref 0.3–1.2)
Total Protein: 6.3 g/dL — ABNORMAL LOW (ref 6.5–8.1)

## 2018-12-05 LAB — GLUCOSE, CAPILLARY
Glucose-Capillary: 221 mg/dL — ABNORMAL HIGH (ref 70–99)
Glucose-Capillary: 279 mg/dL — ABNORMAL HIGH (ref 70–99)
Glucose-Capillary: 186 mg/dL — ABNORMAL HIGH (ref 70–99)

## 2018-12-05 LAB — URINE CULTURE: Culture: NO GROWTH

## 2018-12-05 MED ORDER — INSULIN GLARGINE 100 UNIT/ML ~~LOC~~ SOLN
60.0000 [IU] | Freq: Two times a day (BID) | SUBCUTANEOUS | Status: DC
  Administered 2018-12-05: 60 [IU] via SUBCUTANEOUS
  Filled 2018-12-05 (×2): qty 0.6

## 2018-12-05 MED ORDER — FLUCONAZOLE 200 MG PO TABS: 200.0000 mg | ORAL_TABLET | Freq: Every day | ORAL | 0 refills | Status: AC

## 2018-12-05 MED ORDER — INSULIN GLARGINE 100 UNIT/ML SOLOSTAR PEN: 60.0000 [IU] | PEN_INJECTOR | Freq: Two times a day (BID) | SUBCUTANEOUS | 0 refills | Status: DC

## 2018-12-05 MED ORDER — METOCLOPRAMIDE HCL 5 MG PO TABS: 5.0000 mg | ORAL_TABLET | Freq: Three times a day (TID) | ORAL | 0 refills | Status: DC

## 2018-12-05 MED ORDER — PANTOPRAZOLE SODIUM 40 MG PO TBEC: 40.0000 mg | DELAYED_RELEASE_TABLET | Freq: Every day | ORAL | 0 refills | Status: DC

## 2018-12-05 MED ORDER — INSULIN ASPART 100 UNIT/ML ~~LOC~~ SOLN: 8.0000 [IU] | Freq: Three times a day (TID) | SUBCUTANEOUS | Status: DC

## 2018-12-05 MED ORDER — PANTOPRAZOLE SODIUM 40 MG IV SOLR: 40.0000 mg | INTRAVENOUS | 0 refills | Status: DC

## 2018-12-05 MED ORDER — SIMVASTATIN 40 MG PO TABS: 40.0000 mg | ORAL_TABLET | Freq: Every day | ORAL | Status: DC

## 2018-12-05 MED ORDER — INSULIN PEN NEEDLE 31G X 5 MM MISC: 1.0000 | Freq: Two times a day (BID) | 0 refills | Status: DC

## 2018-12-05 MED ORDER — INSULIN GLARGINE 100 UNIT/ML ~~LOC~~ SOLN
35.0000 [IU] | Freq: Every day | SUBCUTANEOUS | Status: DC
  Filled 2018-12-05: qty 0.35

## 2018-12-05 MED ORDER — INSULIN ASPART 100 UNIT/ML ~~LOC~~ SOLN: 5.0000 [IU] | Freq: Three times a day (TID) | SUBCUTANEOUS | Status: DC

## 2018-12-05 MED ORDER — LISINOPRIL 20 MG PO TABS
40.0000 mg | ORAL_TABLET | Freq: Every day | ORAL | Status: DC
  Administered 2018-12-05: 40 mg via ORAL
  Filled 2018-12-05: qty 2

## 2018-12-05 MED ORDER — AMOXICILLIN-POT CLAVULANATE 875-125 MG PO TABS: 1.0000 | ORAL_TABLET | Freq: Two times a day (BID) | ORAL | 0 refills | Status: AC

## 2018-12-05 MED ORDER — INSULIN GLARGINE 100 UNIT/ML ~~LOC~~ SOLN
40.0000 [IU] | Freq: Two times a day (BID) | SUBCUTANEOUS | Status: DC
  Filled 2018-12-05 (×2): qty 0.4

## 2018-12-05 NOTE — Progress Notes (Signed)
Progress Note    ASSESSMENT AND PLAN:   1. Nausea / vomiting. Suspect gastroparesis (food residual in stomach on EGD). Biopsies of erythematous stomach are pending.  --we started reglan 5 mg ac and he feel like a new person and wants to eat. Discussed potential but uncommon side effects of Reglan which would necessitate immediate discontinuation of the medication ( involuntary muscle movements, tics, lip smacking) -- Discussed need for good glucose control --Small frequent meals --He should be okay for discharge today. Will get a set of repeat liver tests before he goes.  --I will send message to office to arrange for outpatient follow up with me in a couple of weeks.   2. Esophageal candidiasis --Continue Diflucan 200 mg daily x 14 days. Got loading dose of 400 mg yesterday  3. Trivial increase in direct bilirubin but total bilirubin normalized as of yesterday.  No other liver tests drawn today but as of yesterday enzymes still in low 100's. Hepatitis panel negative. Never did have any abdominal pain -will get set of liver tests today    SUBJECTIVE   Feels so much better. No nausea, wants to eat.   OBJECTIVE:      Vital signs in last 24 hours: Temp:  [97.7 F (36.5 C)-99.4 F (37.4 C)] 97.8 F (36.6 C) (09/13 1016) Pulse Rate:  [77-93] 88 (09/13 1016) Resp:  [12-20] 20 (09/13 0512) BP: (117-158)/(66-89) 158/80 (09/13 1016) SpO2:  [90 %-98 %] 90 % (09/13 1016) Weight:  [119 kg] 119 kg (09/12 1347) Last BM Date: (PTA ) General:   Alert, obese male in NAD EENT:  Normal hearing, non icteric sclera, conjunctive pink.  Heart:  Regular rate and rhythm;  No lower extremity edema   Pulm: Normal respiratory effort Abdomen:  Soft, nondistended, nontender.  Normal bowel sounds.    Neurologic:  Alert and  oriented x4;  grossly normal neurologically. Psych:  Pleasant, cooperative.  Normal mood and affect.   EGD 12/04/18 Esophagogastric landmarks identified. - Esophageal  plaques were found, consistent with candidiasis. - Erythematous mucosa in the stomach. Biopsied. - A small amount of food (residue) in the stomach. - No evidence of gastric outlet obstruction or PUD. - Normal duodenal bulb and second portion of the duodenum. Suspect patient could have developed gastroparesis in the setting of worsening diabetes,causing recent symptoms   Intake/Output from previous day: 09/12 0701 - 09/13 0700 In: 1177.7 [I.V.:918.7; IV Piggyback:259] Out: 1975 [GLOVF:6433] Intake/Output this shift: No intake/output data recorded.  Lab Results: Recent Labs    12/03/18 0816 12/04/18 0104  WBC 16.1* 13.4*  HGB 13.9 12.6*  HCT 39.9 36.2*  PLT 480* 404*   BMET Recent Labs    12/04/18 0104 12/04/18 0928 12/05/18 0559  NA 130* 129* 133*  K 3.4* 3.8 3.9  CL 96* 95* 94*  CO2 23 23 25   GLUCOSE 92 208* 230*  BUN 15 16 11   CREATININE 0.93 0.97 0.94  CALCIUM 7.8* 7.7* 8.0*   LFT Recent Labs    12/03/18 1324 12/04/18 0104  PROT  --  6.4*  ALBUMIN  --  2.6*  AST  --  112*  ALT  --  113*  ALKPHOS  --  98  BILITOT 1.2 0.6  BILIDIR 0.3*  --   IBILI 0.9  --    PT/INR Recent Labs    12/03/18 0816 12/04/18 0104  LABPROT 16.2* 16.7*  INR 1.3* 1.4*   Hepatitis Panel Recent Labs    12/03/18 0850  HEPBSAG Negative  HCVAB <0.1  HEPAIGM Negative  HEPBIGM Negative    No results found.    Principal Problem:   Aspiration pneumonia (HCC) Active Problems:   ANEMIA-IRON DEFICIENCY   ERECTILE DYSFUNCTION, ORGANIC   Type 2 diabetes, uncontrolled, with neuropathy (HCC)   Fatty liver   Aspiration into airway   DKA (diabetic ketoacidoses) (HCC)     LOS: 2 days   [name]  ,NP 12/05/2018, 10:57 AM

## 2018-12-05 NOTE — TOC Initial Note (Signed)
Transition of Care [facility]) - Initial/Assessment Note    Patient Details  Name: [name] MRN: [id number] Date of Birth: 1950/06/28  [facility]) CM/SW Contact:    Joaquin Courts, RN Phone Number: 12/05/2018, 1:27 PM  Clinical Narrative:  Patient set up with [facility] for HHPT.                  Expected Discharge Plan: [place] Barriers to Discharge: Continued Medical Work up   Patient Goals and CMS Choice Patient states their goals for this hospitalization and ongoing recovery are:: to go home CMS Medicare.gov Compare Post Acute Care list provided to:: Patient Choice offered to / list presented to : Patient  Expected Discharge Plan and Services Expected Discharge Plan: [place]   Discharge Planning Services: CM Consult Post Acute Care Choice: [place] arrangements for the past 2 months: Single Family Home Expected Discharge Date: (unknown)               DME Arranged: N/A DME Agency: NA       HH Arranged: PT HH Agency: [place] ([place]) Date HH Agency Contacted: 12/05/18 Time HH Agency Contacted: [place] Representative spoke with at [place]: Corene Cornea  Prior Living Arrangements/Services Living arrangements for the past 2 months: [place] with:: Spouse Patient language and need for interpreter reviewed:: Yes Do you feel safe going back to the place where you live?: Yes      Need for Family Participation in Patient Care: Yes (Comment) Care giver support system in place?: Yes (comment)   Criminal Activity/Legal Involvement Pertinent to Current Situation/Hospitalization: No - Comment as needed  Activities of Daily Living Home Assistive Devices/Equipment: Cane (specify quad or straight), CBG Meter, Eyeglasses(single point decorative cane) ADL Screening (condition at time of admission) Patient's cognitive ability adequate to safely complete daily activities?: Yes Is the  patient deaf or have difficulty hearing?: No Does the patient have difficulty seeing, even when wearing glasses/contacts?: No Does the patient have difficulty concentrating, remembering, or making decisions?: No Patient able to express need for assistance with ADLs?: Yes Does the patient have difficulty dressing or bathing?: No Independently performs ADLs?: Yes (appropriate for developmental age) Does the patient have difficulty walking or climbing stairs?: Yes(secondary to joint pain) Weakness of Legs: Both Weakness of Arms/Hands: None  Permission Sought/Granted                  Emotional Assessment           Psych Involvement: No (comment)  Admission diagnosis:  SOB (shortness of breath) [R06.02] AKI (acute kidney injury) ([place]) [N17.9] PNA (pneumonia) [J18.9] Pneumonia of right upper lobe due to infectious organism ([place]) [J18.1] Nausea and vomiting, intractability of vomiting not specified, unspecified vomiting type [R11.2] DKA (diabetic ketoacidoses) ([place]) [E11.10] Patient Active Problem List   Diagnosis Date Noted  . Aspiration into airway 12/03/2018  . Aspiration pneumonia ([place]) 12/03/2018  . DKA (diabetic ketoacidoses) ([place]) 12/03/2018  . Nausea and vomiting   . Abnormal liver enzymes   . Fatty liver 07/27/2018  . Type 2 diabetes, uncontrolled, with neuropathy ([place]) 02/02/2015  . Obesity (BMI 30-39.9) 03/03/2013  . Onychomycosis 10/01/2012  . S/P left total knee replacement 03/05/2011  . Herpes simplex type 2 infection 11/28/2010  . ERECTILE DYSFUNCTION, ORGANIC 04/10/2010  . GALLSTONE PANCREATITIS 01/31/2010  . Hyperlipidemia 04/24/2009  . ANEMIA-IRON DEFICIENCY 09/29/2008  . Essential hypertension 09/29/2008  . ANEMIA, HX  OF 09/29/2008   PCP:  Kristian CoveyBurchette, Bruce W, MD Pharmacy:   [id number] - [place], [place] - [address] [place] [id number] Phone: [phone] Fax:  [phone]     Social Determinants of Health (SDOH) Interventions    Readmission Risk Interventions No flowsheet data found.

## 2018-12-05 NOTE — Progress Notes (Signed)
Pharmacy Antibiotic Note  [name] is a 68 y.o. male admitted on 12/03/2018 with aspiration PNA.  Pharmacy has been consulted for ampicillin/sulbactam dosing.  Today, 12/05/18  WBC 13.4 (9/12) - trending down  SCr 0.94, CrCl ~ 100 mL/min  Afebrile  Day #2 full dose IV antibiotics  Plan:  Ampicillin/sulbactam 3 g IV q6h  Renal function stable. Pharmacy to sign off. Please re-consult if needed.   Height: 6\' 4"  (193 cm) Weight: 262 lb 5.6 oz (119 kg) IBW/kg (Calculated) : 86.8  Temp (24hrs), Avg:98.4 F (36.9 C), Min:97.7 F (36.5 C), Max:99.4 F (37.4 C)  Recent Labs  Lab 12/03/18 0816 12/03/18 0954  12/03/18 1732 12/03/18 2103 12/04/18 0104 12/04/18 0928 12/05/18 0559  WBC 16.1*  --   --   --   --  13.4*  --   --   CREATININE 1.31*  --    < > 1.10 1.12 0.93 0.97 0.94  LATICACIDVEN 1.5 1.2  --   --   --   --   --   --    < > = values in this interval not displayed.    Estimated Creatinine Clearance: 110.5 mL/min (by C-G formula based on SCr of 0.94 mg/dL).    Allergies  Allergen Reactions  . Cortisone Itching    Hives and causes blood sugar to elevate    Antimicrobials this admission: Ampicillin/sulbactam 9/11 >>  Cefepime and vancomycin one time doses 9/11  Dose adjustments this admission:  Microbiology results: 9/11 BCx: ngtd 9/11 UCx: ngf 9/11 MRSA PCR: Negative 9/11 SARS-2: Negative  Thank you for allowing pharmacy to be a part of this patient's care.  Lenis Noon, PharmD 12/05/2018 11:54 AM

## 2018-12-05 NOTE — Progress Notes (Signed)
Home health agencies that serve [id number].        Home Health Agencies Search Results  Results List Table  Home Health Agency Information Quality of Patient Care Rating Patient Survey Summary Rating  [facility] [phone] 3  out of 5 stars 4 out of 5 stars  [facility] [phone] 3 out of 5 stars 4 out of 5 stars  [facility] [phone] 4  out of 5 stars 3 out of 5 stars  [facility] [phone] 4  out of 5 stars 4 out of 5 stars  [facility] [phone] 4 out of 5 stars 4 out of 5 stars  [facility] [phone] 4 out of 5 stars 4 out of 5 stars  [facility] [phone] 3  out of 5 stars 4 out of 5 stars  [facility] [phone] 3 out of 5 stars 4 out of 5 stars  [facility] [phone] 3  out of 5 stars 3 out of 5 stars  [facility] [phone] 3  out of 5 stars 4 out of 5 stars  [facility] [phone] Not Available5 Not Available12  [facility] [phone] 3  out of 5 stars 3 out of 5 stars  [facility] [phone] 4  out of 5 stars 3 out of 5 stars   Home Health Footnotes  Footnote number Footnote as displayed on Home Health Compare  1 This agency provides services under a federal waiver program to non-traditional, chronic long term population.  2 This agency provides services to a special needs population.  3 Not Available.  4 The number of patient episodes for this measure is too small to report.  5 This measure currently does not have data or provider has been certified/recertified for less than 6 months.  6 The national average for this measure is not provided because of state-to-state differences in data collection.  7 Medicare is not displaying rates for this measure for any home health agency, because of an issue with the data.  8 There were  problems with the data and they are being corrected.  9 Zero, or very few, patients met the survey's rules for inclusion. The scores shown, if any, reflect a very small number of surveys and may not accurately tell how an agency is doing.  10 Survey results are based on less than 12 months of data.  11 Fewer than 70 patients completed the survey. Use the scores shown, if any, with caution as the number of surveys may be too low to accurately tell how an agency is doing.  12 No survey results are available for this period.  13 Data suppressed by CMS for one or more quarters.    

## 2018-12-05 NOTE — Discharge Summary (Addendum)
Physician Discharge Summary  RONEL RODEHEAVER JME:[id number] DOB: 1950-10-22 DOA: 12/03/2018  PCP: [name], MD  Admit date: 12/03/2018 Discharge date: 12/05/2018  Admitted From: Home  Disposition:  Home  Recommendations for Outpatient Follow-up:  1. Follow up with PCP in 1-2 weeks 2. Follow up with [facility] for close follow up 3. Follow up liver specific tests with GI 4. Please continue to titrate insulin with goal of euglycemia  Home Health:PT   Discharge Condition:Improved CODE STATUS:DNR Diet recommendation: Diabetic   Brief/Interim Summary: 68 year old Caucasian male known history of severe pancreatitis in 2010, paroxysmal A. fib at that admission, fatty liver disease, DM TY 2+ neuropathy-last A1c 8.0 November 2019 Chronic pain syndrome-sees pain management Back surgery 2001, 2003 Remote history of gangrenous bowel resection 1979 asthma Left knee replacement 2012  Called PCP office 9/11 feeling nauseous having diarrhea for the past 10 days unable to keep anything down and having low back pain without dysuria found to have a low O2 sat 91 pulse of 107 and patient was sent to [facility] long ED  Reports to me main issues are fever in addition to the above has not had much diarrhea, + abdominal pain in the back-has had UTIs last one about 6 months ago-has not kept down food in over 5 days with anorexia has not been able to control his blood sugar take his meds Does not complain of epigastric pain at present time does feel gassy  Discharge Diagnoses:  Principal Problem:   Aspiration pneumonia ([place]) Active Problems:   ANEMIA-IRON DEFICIENCY   ERECTILE DYSFUNCTION, ORGANIC   Type 2 diabetes, uncontrolled, with neuropathy ([place])   Fatty liver   Aspiration into airway   DKA (diabetic ketoacidoses) ([place])   Elevated liver enzymes  Aspiration pneumonia -CXR personally reviewed with findings of RUL consolidation -Pt is continued on Unasyn  -Currently afebrile.  Presenting WBC of 16.1k -WBC improved -O2 weaned to room air  Nausea/vomiting -GI consulted, appreciate input -EGD results reviewed. Findings of esophageal candidiasis and erythematous stomach with biopsies taken -GI recommendation for trial of antifungal, reglan before each meal, and PPI -Tolerating diet  DKA-probably very mild in a setting of Hypovolemic hyponatremia with metabolic acidosis and anion gap acidosis of 17 at presentation -DKA resolved with IVF and insulin gtt and transitioned to subq insulin -Hgba1c of 9.2 -Will discharge on lantus 60units BID with SSI coverage, was recommended dose by pt's PCP per outpt records reviewed. -Glucose at time of d/c was 186.  UTI ruled out -Urinalysis reviewed, clear. Not suggestive of UIT -Urine cx with no growth  HTN- -Vital signs reviewed -BP stable at present -resume home meds on d/c  Chronic pain secondary to multiple orthopedic injuries -On Robaxin every 8 500 Lyrica 50 at bedtime, MS Contin 30 every 12 as well as 2 tabs Percocet every 8-would not escalate beyond the same -Seems stable at this time  Elevated LFT's -alk phos of 327, ast/alt of 227/207 noted -Unclear etiology -Discussed with GI. OK for d/c with close outpatient follow up -Liver specific serologies are pending including: HepC, anti-SM, anti-Mitochondrial ab, IgG, ANA, hemachromatosis panel, to be followed by GI in outpatient setting. -Repeat LFT as outpt by GI   Discharge Instructions   Allergies as of 12/05/2018      Reactions   Cortisone Itching   Hives and causes blood sugar to elevate      Medication List    STOP taking these medications   Insulin Syringe-Needle U-100 31G X 5/16"  0.5 ML Misc Commonly known as: BD Insulin Syringe Ultrafine   simvastatin 40 MG tablet Commonly known as: ZOCOR     TAKE these medications   amLODipine 10 MG tablet Commonly known as: NORVASC Take 1 tablet (10 mg total) by mouth daily.    amoxicillin-clavulanate 875-125 MG tablet Commonly known as: Augmentin Take 1 tablet by mouth 2 (two) times daily for 3 days.   azelastine 0.1 % nasal spray Commonly known as: ASTELIN USE 2 SPRAYS IN EACH NOSTRIL TWICE A DAY AS NEEDED FOR ALLERGIES What changed: See the new instructions.   docusate sodium 100 MG capsule Commonly known as: COLACE Take 100 mg by mouth 3 (three) times daily as needed. Constipation   fexofenadine-pseudoephedrine 60-120 MG 12 hr tablet Commonly known as: Allegra-D 12 Hour Take 1 tablet by mouth 2 (two) times daily.   fluconazole 200 MG tablet Commonly known as: DIFLUCAN Take 1 tablet (200 mg total) by mouth daily for 13 days. Start taking on: December 06, 2018   glucose blood test strip Commonly known as: ONE TOUCH ULTRA TEST USE TO CHECK BLOOD SUGAR IN THE AM AND PM AS DIRECTED.  Dx E11.9   HYDROcodone-acetaminophen 10-325 MG tablet Commonly known as: NORCO Take 1 tablet by mouth every 6 (six) hours as needed for moderate pain or severe pain.   Insulin Glargine 100 UNIT/ML Solostar Pen Commonly known as: LANTUS Inject 60 Units into the skin 2 (two) times daily. What changed:   medication strength  how much to take  how to take this  when to take this  additional instructions   Insulin Pen Needle 31G X 5 MM Misc Use as directed with Kara Mead What changed: Another medication with the same name was added. Make sure you understand how and when to take each.   Insulin Pen Needle 31G X 5 MM Misc 1 Device by Does not apply route 2 (two) times daily. For use with insulin pen What changed: You were already taking a medication with the same name, and this prescription was added. Make sure you understand how and when to take each.   lisinopril 40 MG tablet Commonly known as: ZESTRIL TAKE 1 TABLET BY MOUTH EVERY DAY IN THE MORNING What changed: See the new instructions.   methocarbamol 500 MG tablet Commonly known as:  ROBAXIN Take 500 mg by mouth every 8 (eight) hours as needed.   metoCLOPramide 5 MG tablet Commonly known as: REGLAN Take 1 tablet (5 mg total) by mouth 3 (three) times daily before meals.   metoprolol succinate 100 MG 24 hr tablet Commonly known as: TOPROL-XL TAKE 1 TABLET BY MOUTH EVERY MORNING What changed: when to take this   morphine 30 MG 12 hr tablet Commonly known as: MS CONTIN Take 30 mg by mouth every 12 (twelve) hours.   NovoLOG 100 UNIT/ML injection Generic drug: insulin aspart INJECT 3 UNITS 3 TIMES A DAY **ALSO USE SLIDING SCALE** What changed: See the new instructions.   pantoprazole 40 MG tablet Commonly known as: Protonix Take 1 tablet (40 mg total) by mouth daily.   polyethylene glycol 17 g packet Commonly known as: MIRALAX / GLYCOLAX Take 17 g by mouth daily as needed. Constipation   pregabalin 50 MG capsule Commonly known as: LYRICA Take 50 mg by mouth at bedtime as needed.      Follow-up Information    [facility]   Specialty: Home Health Services Why: agency will provide home health physical therapy,  agency will call you to schedule first visit. agency number is [phone].       [name], MD Follow up in 2 week(s).   Specialty: Family Medicine Contact information: [place] [address] [phone]        Yetta Flock, MD Follow up.   Specialty: Gastroenterology Why: follow up as scheduled  Contact information: [place] [address] [phone]          Allergies  Allergen Reactions  . Cortisone Itching    Hives and causes blood sugar to elevate    Consultations:  GI  Procedures/Studies: US Abdomen Limited  Result Date: 12/03/2018 CLINICAL DATA:  Vomiting, fever EXAM: ULTRASOUND ABDOMEN LIMITED RIGHT UPPER QUADRANT COMPARISON:  01/21/2010 FINDINGS: Gallbladder: Surgically absent Common bile duct: Diameter: 8 mm, which may  be normal post cholecystectomy, previously 6 mm pre cholecystectomy. Liver: Mildly heterogeneous increased echogenicity of the liver question subtle fatty infiltration. No mass or nodularity. Portal vein is patent on color Doppler imaging with normal direction of blood flow towards the liver. Other: No RIGHT upper quadrant free fluid. IMPRESSION: Question subtle fatty infiltration of liver. Post cholecystectomy with with 8 mm CBD, which may be physiologic post cholecystectomy; recommend correlation with LFTs. Electronically Signed   By: Lavonia Dana M.D.   On: 12/03/2018 10:44   Dg Chest Port 1 View  Result Date: 12/03/2018 CLINICAL DATA:  Fever EXAM: PORTABLE CHEST 1 VIEW COMPARISON:  January 22, 2011 FINDINGS: There is apparent consolidation in the right upper lobe. Lungs elsewhere are clear. Heart is upper normal in size with pulmonary vascularity normal. No adenopathy. No bone lesions. IMPRESSION: Consolidation right upper lobe. Suspect pneumonia. Underlying mass in this area cannot be excluded radiographically. Lungs elsewhere clear. Heart upper normal in size. No evident adenopathy. Followup PA and lateral radiographs recommended in 3-4 weeks following trial of antibiotic therapy to ensure resolution and exclude underlying malignancy. Electronically Signed   By: Lowella Grip III M.D.   On: 12/03/2018 09:30    Subjective: Very eager to go home  Discharge Exam: Vitals:   12/05/18 1540 12/05/18 1620  BP: (!) 142/85   Pulse: 92 92  Resp: 16   Temp: 98.6 F (37 C)   SpO2: 95% 92%   Vitals:   12/05/18 0512 12/05/18 1016 12/05/18 1540 12/05/18 1620  BP: (!) 157/78 (!) 158/80 (!) 142/85   Pulse: 93 88 92 92  Resp: 20  16   Temp: 99.4 F (37.4 C) 97.8 F (36.6 C) 98.6 F (37 C)   TempSrc:  Oral    SpO2: 95% 90% 95% 92%  Weight:      Height:        General: Pt is alert, awake, not in acute distress Cardiovascular: RRR, S1/S2 +, no rubs, no gallops Respiratory: CTA bilaterally, no  wheezing, no rhonchi Abdominal: Soft, NT, ND, bowel sounds + Extremities: no edema, no cyanosis   The results of significant diagnostics from this hospitalization (including imaging, microbiology, ancillary and laboratory) are listed below for reference.     Microbiology: Recent Results (from the past 240 hour(s))  SARS Coronavirus 2 [facility] order, Performed in [facility] hospital lab) Nasopharyngeal Nasopharyngeal Swab     Status: None   Collection Time: 12/03/18  8:16 AM   Specimen: Nasopharyngeal Swab  Result Value Ref Range Status   SARS Coronavirus 2 NEGATIVE NEGATIVE Final    Comment: (NOTE) If result is NEGATIVE SARS-CoV-2 target nucleic  acids are NOT DETECTED. The SARS-CoV-2 RNA is generally detectable in upper and lower  respiratory specimens during the acute phase of infection. The lowest  concentration of SARS-CoV-2 viral copies this assay can detect is 250  copies / mL. A negative result does not preclude SARS-CoV-2 infection  and should not be used as the sole basis for treatment or other  patient management decisions.  A negative result may occur with  improper specimen collection / handling, submission of specimen other  than nasopharyngeal swab, presence of viral mutation(s) within the  areas targeted by this assay, and inadequate number of viral copies  (<250 copies / mL). A negative result must be combined with clinical  observations, patient history, and epidemiological information. If result is POSITIVE SARS-CoV-2 target nucleic acids are DETECTED. The SARS-CoV-2 RNA is generally detectable in upper and lower  respiratory specimens dur ing the acute phase of infection.  Positive  results are indicative of active infection with SARS-CoV-2.  Clinical  correlation with patient history and other diagnostic information is  necessary to determine patient infection status.  Positive results do  not rule out bacterial infection or co-infection with other  viruses. If result is PRESUMPTIVE POSTIVE SARS-CoV-2 nucleic acids MAY BE PRESENT.   A presumptive positive result was obtained on the submitted specimen  and confirmed on repeat testing.  While 2019 novel coronavirus  (SARS-CoV-2) nucleic acids may be present in the submitted sample  additional confirmatory testing may be necessary for epidemiological  and / or clinical management purposes  to differentiate between  SARS-CoV-2 and other Sarbecovirus currently known to infect humans.  If clinically indicated additional testing with an alternate test  methodology [phone]) is advised. The SARS-CoV-2 RNA is generally  detectable in upper and lower respiratory sp ecimens during the acute  phase of infection. The expected result is Negative. Fact Sheet for Patients:  StrictlyIdeas.no Fact Sheet for Healthcare Providers: BankingDealers.co.za This test is not yet approved or cleared by the [place] FDA and has been authorized for detection and/or diagnosis of SARS-CoV-2 by FDA under an Emergency Use Authorization (EUA).  This EUA will remain in effect (meaning this test can be used) for the duration of the COVID-19 declaration under Section 564(b)(1) of the Act, 21 U.S.C. section 360bbb-3(b)(1), unless the authorization is terminated or revoked sooner. Performed at [facility], [place] [address] [place], [place] [address]   Culture, blood (Routine x 2)     Status: None (Preliminary result)   Collection Time: 12/03/18  8:16 AM   Specimen: BLOOD  Result Value Ref Range Status   Specimen Description   Final    BLOOD RIGHT ANTECUBITAL Performed at [facility] [address] [place], [place] [address]    Special Requests   Final    BOTTLES DRAWN AEROBIC AND ANAEROBIC Blood Culture results may not be optimal due to an inadequate volume of blood received in culture bottles Performed at [facility] [address] [place], [place] [address]    Culture   Final    NO GROWTH 2 DAYS Performed at [facility] [address], [place], [place] [address]    Report Status PENDING  Incomplete  Culture, blood (Routine x 2)     Status: None (Preliminary result)   Collection Time: 12/03/18  8:21 AM   Specimen: BLOOD  Result Value Ref Range Status   Specimen Description   Final    BLOOD LEFT ANTECUBITAL Performed  at [facility], [place] [address] [place], [place] [address]    Special Requests   Final    BOTTLES DRAWN AEROBIC AND ANAEROBIC Blood Culture adequate volume Performed at [facility] [address], [place], [place] [address]    Culture   Final    NO GROWTH 2 DAYS Performed at [place] [address] [place], [place] [address]    Report Status PENDING  Incomplete  Urine culture     Status: None   Collection Time: 12/03/18 12:01 PM   Specimen: In/Out Cath Urine  Result Value Ref Range Status   Specimen Description   Final    IN/OUT CATH URINE Performed at [place] [address], [place], [place] [address]    Special Requests   Final    NONE Performed at [facility], [place] [address], [place], [place] [address]    Culture   Final    NO GROWTH Performed at [facility], [place] [address], [place], [place] [address]    Report Status 12/04/2018 FINAL  Final  MRSA PCR Screening     Status: None   Collection Time: 12/03/18  3:19 PM   Specimen: Nasal Mucosa; Nasopharyngeal  Result Value Ref Range Status   MRSA by PCR NEGATIVE NEGATIVE Final    Comment:        The GeneXpert MRSA Assay (FDA approved for NASAL specimens only), is one component of a comprehensive MRSA colonization surveillance program. It is not intended to diagnose MRSA infection nor to guide or monitor treatment for MRSA infections. Performed at [facility],  [place] [address] [place], [place] [address]   Urine culture     Status: None   Collection Time: 12/03/18  5:34 PM   Specimen: Urine, Random  Result Value Ref Range Status   Specimen Description   Final    URINE, RANDOM Performed at [facility] [address] [place], [place] [address]    Special Requests   Final    NONE Performed at [facility], [place] [address] [place], [place] [address]    Culture   Final    NO GROWTH Performed at [facility], [place] [address], [place], [place] [address]    Report Status 12/05/2018 FINAL  Final     Labs: BNP (last 3 results) No results for input(s): BNP in the last 8760 hours. Basic Metabolic Panel: Recent Labs  Lab 12/03/18 1732 12/03/18 2103 12/04/18 0104 12/04/18 0928 12/05/18 0559  NA 130* 128* 130* 129* 133*  K 3.5 3.8 3.4* 3.8 3.9  CL 90* 93* 96* 95* 94*  CO2 22 21* '23 23 25  '$ GLUCOSE 205* 156* 92 208* 230*  BUN '18 16 15 16 11  '$ CREATININE 1.10 1.12 0.93 0.97 0.94  CALCIUM 8.4* 8.2* 7.8* 7.7* 8.0*   Liver Function Tests: Recent Labs  Lab 12/03/18 0816 12/03/18 1324 12/04/18 0104 12/05/18 1207  AST 105*  --  112* 227*  ALT 126*  --  113* 207*  ALKPHOS 116  --  98 327*  BILITOT 1.5* 1.2 0.6 1.4*  PROT 7.3  --  6.4* 6.3*  ALBUMIN 3.0*  --  2.6* 2.4*   No results for input(s): LIPASE, AMYLASE in the last 168 hours. No results for input(s): AMMONIA in the last 168 hours. CBC: Recent Labs  Lab 12/03/18 0816 12/04/18 0104  WBC 16.1* 13.4*  NEUTROABS 13.6*  --  HGB 13.9 12.6*  HCT 39.9 36.2*  MCV 86.0 86.4  PLT 480* 404*   Cardiac Enzymes: No results for input(s): CKTOTAL, CKMB, CKMBINDEX, TROPONINI in the last 168 hours. BNP: Invalid input(s): POCBNP CBG: Recent Labs  Lab 12/04/18 1623 12/04/18 2120 12/05/18 0740 12/05/18 1136 12/05/18 1551  GLUCAP 215* 235* 221* 279* 186*   D-Dimer No results for input(s): DDIMER in the last 72 hours. Hgb A1c Recent  Labs    12/03/18 1324 12/04/18 0104  HGBA1C 9.4* 9.2*   Lipid Profile No results for input(s): CHOL, HDL, LDLCALC, TRIG, CHOLHDL, LDLDIRECT in the last 72 hours. Thyroid function studies No results for input(s): TSH, T4TOTAL, T3FREE, THYROIDAB in the last 72 hours.  Invalid input(s): FREET3 Anemia work up Recent Labs    12/03/18 0850  FERRITIN 1,270*  TIBC 189*  IRON 19*   Urinalysis    Component Value Date/Time   COLORURINE YELLOW 12/03/2018 0816   APPEARANCEUR CLEAR 12/03/2018 0816   LABSPEC 1.013 12/03/2018 0816   PHURINE 5.0 12/03/2018 0816   GLUCOSEU >=500 (A) 12/03/2018 0816   HGBUR NEGATIVE 12/03/2018 0816   BILIRUBINUR NEGATIVE 12/03/2018 0816   BILIRUBINUR + 10/14/2010 0929   KETONESUR 20 (A) 12/03/2018 0816   PROTEINUR NEGATIVE 12/03/2018 0816   UROBILINOGEN 0.2 02/26/2011 0940   NITRITE NEGATIVE 12/03/2018 0816   LEUKOCYTESUR NEGATIVE 12/03/2018 0816   Sepsis Labs Invalid input(s): PROCALCITONIN,  WBC,  LACTICIDVEN Microbiology Recent Results (from the past 240 hour(s))  SARS Coronavirus 2 [facility] order, Performed in [facility] hospital lab) Nasopharyngeal Nasopharyngeal Swab     Status: None   Collection Time: 12/03/18  8:16 AM   Specimen: Nasopharyngeal Swab  Result Value Ref Range Status   SARS Coronavirus 2 NEGATIVE NEGATIVE Final    Comment: (NOTE) If result is NEGATIVE SARS-CoV-2 target nucleic acids are NOT DETECTED. The SARS-CoV-2 RNA is generally detectable in upper and lower  respiratory specimens during the acute phase of infection. The lowest  concentration of SARS-CoV-2 viral copies this assay can detect is 250  copies / mL. A negative result does not preclude SARS-CoV-2 infection  and should not be used as the sole basis for treatment or other  patient management decisions.  A negative result may occur with  improper specimen collection / handling, submission of specimen other  than nasopharyngeal swab, presence of viral mutation(s)  within the  areas targeted by this assay, and inadequate number of viral copies  (<250 copies / mL). A negative result must be combined with clinical  observations, patient history, and epidemiological information. If result is POSITIVE SARS-CoV-2 target nucleic acids are DETECTED. The SARS-CoV-2 RNA is generally detectable in upper and lower  respiratory specimens dur ing the acute phase of infection.  Positive  results are indicative of active infection with SARS-CoV-2.  Clinical  correlation with patient history and other diagnostic information is  necessary to determine patient infection status.  Positive results do  not rule out bacterial infection or co-infection with other viruses. If result is PRESUMPTIVE POSTIVE SARS-CoV-2 nucleic acids MAY BE PRESENT.   A presumptive positive result was obtained on the submitted specimen  and confirmed on repeat testing.  While 2019 novel coronavirus  (SARS-CoV-2) nucleic acids may be present in the submitted sample  additional confirmatory testing may be necessary for epidemiological  and / or clinical management purposes  to differentiate between  SARS-CoV-2 and other Sarbecovirus currently known to infect humans.  If clinically indicated additional testing with an alternate test  methodology [phone]) is advised. The SARS-CoV-2 RNA is generally  detectable in upper and lower respiratory sp ecimens during the acute  phase of infection. The expected result is Negative. Fact Sheet for Patients:  StrictlyIdeas.no Fact Sheet for Healthcare Providers: BankingDealers.co.za This test is not yet approved or cleared by the [place] FDA and has been authorized for detection and/or diagnosis of SARS-CoV-2 by FDA under an Emergency Use Authorization (EUA).  This EUA will remain in effect (meaning this test can be used) for the duration of the COVID-19 declaration under Section 564(b)(1) of the Act,  21 U.S.C. section 360bbb-3(b)(1), unless the authorization is terminated or revoked sooner. Performed at [facility], [place] [address] [place], [place] [address]   Culture, blood (Routine x 2)     Status: None (Preliminary result)   Collection Time: 12/03/18  8:16 AM   Specimen: BLOOD  Result Value Ref Range Status   Specimen Description   Final    BLOOD RIGHT ANTECUBITAL Performed at [place] [facility] [address] [place], [place] [address]    Special Requests   Final    BOTTLES DRAWN AEROBIC AND ANAEROBIC Blood Culture results may not be optimal due to an inadequate volume of blood received in culture bottles Performed at [place] [address], [place], [place] [address]    Culture   Final    NO GROWTH 2 DAYS Performed at [place] [address], [place], [place] [address]    Report Status PENDING  Incomplete  Culture, blood (Routine x 2)     Status: None (Preliminary result)   Collection Time: 12/03/18  8:21 AM   Specimen: BLOOD  Result Value Ref Range Status   Specimen Description   Final    BLOOD LEFT ANTECUBITAL Performed at [facility] [address], [place], [place] [address]    Special Requests   Final    BOTTLES DRAWN AEROBIC AND ANAEROBIC Blood Culture adequate volume Performed at [facility] [address] [place], [place] [address]    Culture   Final    NO GROWTH 2 DAYS Performed at [facility] [address], [place], [place] [address]    Report Status PENDING  Incomplete  Urine culture     Status: None   Collection Time: 12/03/18 12:01 PM   Specimen: In/Out Cath Urine  Result Value Ref Range Status   Specimen Description   Final    IN/OUT CATH URINE Performed at [place] [address], [place], [place] [address]    Special Requests   Final    NONE Performed at [facility], [place]  [address], [place], [place] [address]    Culture   Final    NO GROWTH Performed at [facility], [place] [address] [place], [place] [address]    Report Status 12/04/2018 FINAL  Final  MRSA PCR Screening     Status: None   Collection Time: 12/03/18  3:19 PM   Specimen: Nasal Mucosa; Nasopharyngeal  Result Value Ref Range Status   MRSA by PCR NEGATIVE NEGATIVE Final    Comment:        The GeneXpert MRSA Assay (FDA approved for NASAL specimens only), is one component of a comprehensive MRSA colonization surveillance program. It is not intended to diagnose MRSA infection nor to guide or monitor treatment for MRSA infections. Performed at [facility], [place]  [address], [place], [place] [address]   Urine culture     Status: None   Collection Time: 12/03/18  5:34 PM   Specimen: Urine, Random  Result Value Ref Range Status   Specimen Description   Final    URINE, RANDOM Performed at [facility] [address] [place], [place] [address]    Special Requests   Final    NONE Performed at [facility], [place] [address] [place], [place] [address]    Culture   Final    NO GROWTH Performed at [facility], [place] [address] [place],  [address]    Report Status 12/05/2018 FINAL  Final   Time spent: 30 min  SIGNED:   Marylu Lund, MD  [facility] 12/05/2018, 4:31 PM  If 7PM-7AM, please contact night-coverage

## 2018-12-06 ENCOUNTER — Telehealth: Payer: Self-pay | Admitting: Gastroenterology

## 2018-12-06 ENCOUNTER — Encounter (HOSPITAL_COMMUNITY): Payer: Self-pay | Admitting: Gastroenterology

## 2018-12-06 LAB — GLUCOSE, CAPILLARY: Glucose-Capillary: 287 mg/dL — ABNORMAL HIGH (ref 70–99)

## 2018-12-06 LAB — ANA W/REFLEX IF POSITIVE: Anti Nuclear Antibody (ANA): NEGATIVE

## 2018-12-06 NOTE — Telephone Encounter (Signed)
I spoke with the patient's wife. Dr. Havery Moros ordered labs.  He will come one day this week for the labs.  His wife understands to bring him to the basement lab M-F 7:30-5

## 2018-12-06 NOTE — Telephone Encounter (Signed)
Patient might be calling the wrong office.  He has only been seen in the hospital this weekend for EGD from the ER .  I left a message for the wife to call back.  Was to have an abdominal US on 12/03/18.

## 2018-12-07 ENCOUNTER — Telehealth: Payer: Self-pay

## 2018-12-07 ENCOUNTER — Other Ambulatory Visit (INDEPENDENT_AMBULATORY_CARE_PROVIDER_SITE_OTHER): Payer: PPO

## 2018-12-07 DIAGNOSIS — R748 Abnormal levels of other serum enzymes: Secondary | ICD-10-CM | POA: Diagnosis not present

## 2018-12-07 LAB — HEPATIC FUNCTION PANEL
ALT: 177 U/L — ABNORMAL HIGH (ref 0–53)
AST: 107 U/L — ABNORMAL HIGH (ref 0–37)
Albumin: 3.2 g/dL — ABNORMAL LOW (ref 3.5–5.2)
Alkaline Phosphatase: 498 U/L — ABNORMAL HIGH (ref 39–117)
Bilirubin, Direct: 0.1 mg/dL (ref 0.0–0.3)
Total Bilirubin: 0.6 mg/dL (ref 0.2–1.2)
Total Protein: 6.8 g/dL (ref 6.0–8.3)

## 2018-12-07 LAB — MITOCHONDRIAL ANTIBODIES: Mitochondrial M2 Ab, IgG: 23.4 Units — ABNORMAL HIGH (ref 0.0–20.0)

## 2018-12-07 LAB — IGG: IgG (Immunoglobin G), Serum: 813 mg/dL (ref 603–1613)

## 2018-12-07 LAB — ANTI-SMOOTH MUSCLE ANTIBODY, IGG: F-Actin IgG: 8 Units (ref 0–19)

## 2018-12-07 NOTE — Telephone Encounter (Signed)
-----   Message from Yetta Flock, MD sent at 12/05/2018  1:51 PM EDT ----- Regarding: follow up Hi [name],  This patient needs office follow up with me in 4 weeks or so. Can see Nevin Bloodgood if I have no openings, she knows him well. I have also ordered LFTs for him to have done at our office Tues / Wed if you can remind him to go to the lab. Thanks

## 2018-12-07 NOTE — Telephone Encounter (Signed)
Patient came into our lab today and had LFTs drawn. Left message on patient's voice mail to please call back to schedule an office visit with Tye Savoy. (in 3-4 weeks).

## 2018-12-08 ENCOUNTER — Other Ambulatory Visit: Payer: Self-pay

## 2018-12-08 ENCOUNTER — Telehealth: Payer: Self-pay | Admitting: Family Medicine

## 2018-12-08 DIAGNOSIS — R748 Abnormal levels of other serum enzymes: Secondary | ICD-10-CM

## 2018-12-08 LAB — HEPATITIS C VRS RNA DETECT BY PCR-QUAL: Hepatitis C Vrs RNA by PCR-Qual: NEGATIVE

## 2018-12-08 LAB — CULTURE, BLOOD (ROUTINE X 2)
Culture: NO GROWTH
Culture: NO GROWTH
Special Requests: ADEQUATE

## 2018-12-08 NOTE — Telephone Encounter (Signed)
noted 

## 2018-12-08 NOTE — Telephone Encounter (Signed)
Please see message. °

## 2018-12-08 NOTE — Telephone Encounter (Signed)
[name], pt with [facility], states that he has been unable to reach patient by telephone and also patient will not answer door for him. [name] states he will try again, just calling as FYI.      [name] may be contacted with any questions.  # [phone]

## 2018-12-10 ENCOUNTER — Other Ambulatory Visit (INDEPENDENT_AMBULATORY_CARE_PROVIDER_SITE_OTHER): Payer: PPO

## 2018-12-10 ENCOUNTER — Other Ambulatory Visit: Payer: Self-pay

## 2018-12-10 DIAGNOSIS — R748 Abnormal levels of other serum enzymes: Secondary | ICD-10-CM | POA: Diagnosis not present

## 2018-12-10 LAB — HEPATIC FUNCTION PANEL
ALT: 80 U/L — ABNORMAL HIGH (ref 0–53)
AST: 48 U/L — ABNORMAL HIGH (ref 0–37)
Albumin: 3.4 g/dL — ABNORMAL LOW (ref 3.5–5.2)
Alkaline Phosphatase: 444 U/L — ABNORMAL HIGH (ref 39–117)
Bilirubin, Direct: 0 mg/dL (ref 0.0–0.3)
Total Bilirubin: 0.5 mg/dL (ref 0.2–1.2)
Total Protein: 7 g/dL (ref 6.0–8.3)

## 2018-12-10 LAB — HEMOCHROMATOSIS DNA-PCR(C282Y,H63D)

## 2018-12-20 ENCOUNTER — Other Ambulatory Visit (INDEPENDENT_AMBULATORY_CARE_PROVIDER_SITE_OTHER): Payer: PPO

## 2018-12-20 ENCOUNTER — Encounter: Payer: Self-pay | Admitting: Physician Assistant

## 2018-12-20 ENCOUNTER — Ambulatory Visit: Payer: PPO | Admitting: Physician Assistant

## 2018-12-20 VITALS — BP 100/58 | HR 100 | Temp 97.8°F | Ht 76.0 in | Wt 240.4 lb

## 2018-12-20 DIAGNOSIS — R112 Nausea with vomiting, unspecified: Secondary | ICD-10-CM | POA: Diagnosis not present

## 2018-12-20 DIAGNOSIS — K219 Gastro-esophageal reflux disease without esophagitis: Secondary | ICD-10-CM | POA: Diagnosis not present

## 2018-12-20 DIAGNOSIS — R748 Abnormal levels of other serum enzymes: Secondary | ICD-10-CM | POA: Diagnosis not present

## 2018-12-20 LAB — HEPATIC FUNCTION PANEL
ALT: 148 U/L — ABNORMAL HIGH (ref 0–53)
AST: 110 U/L — ABNORMAL HIGH (ref 0–37)
Albumin: 3.9 g/dL (ref 3.5–5.2)
Alkaline Phosphatase: 372 U/L — ABNORMAL HIGH (ref 39–117)
Bilirubin, Direct: 0.1 mg/dL (ref 0.0–0.3)
Total Bilirubin: 0.7 mg/dL (ref 0.2–1.2)
Total Protein: 8.2 g/dL (ref 6.0–8.3)

## 2018-12-20 MED ORDER — PANTOPRAZOLE SODIUM 40 MG PO TBEC: 40.0000 mg | DELAYED_RELEASE_TABLET | Freq: Every day | ORAL | 3 refills | Status: DC

## 2018-12-20 MED ORDER — METOCLOPRAMIDE HCL 5 MG PO TABS: 5.0000 mg | ORAL_TABLET | Freq: Three times a day (TID) | ORAL | 1 refills | Status: DC

## 2018-12-20 NOTE — Progress Notes (Signed)
Chief Complaint: Follow-up hospitalization with elevated liver enzymes  HPI:    [name] is a 68 year old male with a past medical history as listed below including fatty liver, cholecystectomy and others, admitted to the hospital 12/03/2018-12/05/2018 for aspiration pneumonia, was also found to have elevated liver enzymes and iron deficiency anemia.  Patient had nausea and vomiting for which our service was consulted, he presents clinic today for follow-up after recent hospitalization.    12/04/2018 EGD for nausea and vomiting with Dr. Adela Lank with esophageal plaques consistent with candidiasis, erythematous mucosa in the stomach, small amount of residual food in the stomach, was thought patient may have developed gastroparesis in the setting of worsening diabetes.  He was started on Reglan 5 mg before meals and fluconazole.    Patient also had further evaluation of LFTs.  These were chronically elevated and presumed related to fatty liver.  He had an acute hep panel which was negative and ultrasound with any clear abnormalities.  He was thought possibly a drug effect.  His ferritin was greater than 1000 and labs were sent for hemochromatosis gene testing, though this is likely just from his pneumonia.    12/10/2018 hepatic function panel with alkaline phosphatase 448 (498 previously), AST 48 (107 previously), ALT 80 (177 previously), these were noted to improved over the past 3 days.  Was thought possibly they represent a drug reaction from antibiotics.  A repeat was 9/23 but patient never came in and it was completed today just prior to time of office visit.    Today, the patient tells me that overall he is feeling much better than when he was hospitalized.  He has been able to eat some solid foods and is doing pretty good other than feeling still slightly on the tired side.  Just recently he has started with some nausea occasionally off and on over the past day or so and is worried that this  may lead to vomiting again.  Tells me he ran out of his Reglan and Pantoprazole today/yesterday.  Denies any new complaints or concerns.    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    Denies abdominal pain, fever or chills.  Past Medical History:  Diagnosis Date  . Arthritis    oa--and ra.  s/p right knee replacement on Jan 28, 2011--plans left knee replacement on 03/04/11.  also severe lower back pain-previous lumbar fusions, also pain in both hips, shoulders   . Asthma   . Diabetes mellitus   . Gallstone pancreatitis   . GERD (gastroesophageal reflux disease)   . Hepatitis    pancreatitis 2010  . Hyperlipidemia   . Hypertension    eccho 6/10, EKG 1/11, clearence Dr Caryl Never from 11/28/10 on chart  . Peripheral vascular disease (HCC)    states after arthroscopy left and right was given blood thinners- had symptoms of blood clot but wasnt diagnosed  . Pneumonia    2010  . Sleep apnea    study years ago/ no cpap/ states mild    Past Surgical History:  Procedure Laterality Date  . BACK SURGERY     hx of multiple back surgeries including lumbar fusion S1-L3  . BIOPSY  12/04/2018   Procedure: BIOPSY;  Surgeon: Benancio Deeds, MD;  Location: [facility];  Service: Gastroenterology;;  . CHOLECYSTECTOMY    . COLON SURGERY     removal of part of small intestine 1979  . ELBOW SURGERY     bilateral  . ESOPHAGOGASTRODUODENOSCOPY (  EGD) WITH PROPOFOL N/A 12/04/2018   Procedure: ESOPHAGOGASTRODUODENOSCOPY (EGD) WITH PROPOFOL;  Surgeon: Yetta Flock, MD;  Location: [facility];  Service: Gastroenterology;  Laterality: N/A;  . SHOULDER SURGERY     bilateral   . TOTAL KNEE ARTHROPLASTY  01/28/2011   Procedure: TOTAL KNEE ARTHROPLASTY;  Surgeon: Mauri Pole;  Location: [facility];  Service: Orthopedics;  Laterality: Right;  femoral nerve block preop.  Marland Kitchen TOTAL KNEE ARTHROPLASTY  03/04/2011   Procedure: TOTAL KNEE ARTHROPLASTY;  Surgeon: Mauri Pole;  Location: [facility];  Service: Orthopedics;  Laterality: Left;  . TUBAL LIGATION      Current Outpatient Medications  Medication Sig Dispense Refill  . amLODipine (NORVASC) 10 MG tablet Take 1 tablet (10 mg total) by mouth daily. 90 tablet 3  . azelastine (ASTELIN) 0.1 % nasal spray USE 2 SPRAYS IN EACH NOSTRIL TWICE A DAY AS NEEDED FOR ALLERGIES (Patient taking differently: Place 2 sprays into both nostrils 2 (two) times daily as needed for allergies. ) 30 mL 0  . docusate sodium (COLACE) 100 MG capsule Take 100 mg by mouth 3 (three) times daily as needed. Constipation     . fexofenadine-pseudoephedrine (ALLEGRA-D 12 HOUR) 60-120 MG per tablet Take 1 tablet by mouth 2 (two) times daily. (Patient not taking: Reported on 12/03/2018) 60 tablet 6  . glucose blood (ONE TOUCH ULTRA TEST) test strip USE TO CHECK BLOOD SUGAR IN THE AM AND PM AS DIRECTED.  Dx E11.9 200 each 1  . HYDROcodone-acetaminophen (NORCO) 10-325 MG per tablet Take 1 tablet by mouth every 6 (six) hours as needed for moderate pain or severe pain.     . Insulin Glargine (LANTUS) 100 UNIT/ML Solostar Pen Inject 60 Units into the skin 2 (two) times daily. 15 mL 0  . Insulin Pen Needle 31G X 5 MM MISC Use as directed with Basaglar Kwikpen 100 each 1  . Insulin Pen Needle 31G X 5 MM MISC 1 Device by Does not apply route 2 (two) times daily. For use with insulin pen 100 each 0  . lisinopril (ZESTRIL) 40 MG tablet TAKE 1 TABLET BY MOUTH EVERY DAY IN THE MORNING (Patient taking differently: Take 40 mg by mouth daily. ) 90 tablet 1  . methocarbamol (ROBAXIN) 500 MG tablet Take 500 mg by mouth every 8 (eight) hours as needed.  1  . metoCLOPramide (REGLAN) 5 MG tablet Take 1 tablet (5 mg total) by mouth 3 (three) times daily before meals. 90 tablet 0  . metoprolol succinate (TOPROL-XL) 100 MG 24 hr tablet TAKE 1 TABLET BY MOUTH EVERY MORNING (Patient taking differently: Take 100 mg by mouth daily. ) 90 tablet 2  . morphine (MS CONTIN) 30 MG 12 hr tablet Take  30 mg by mouth every 12 (twelve) hours.   0  . NOVOLOG 100 UNIT/ML injection INJECT 3 UNITS 3 TIMES A DAY **ALSO USE SLIDING SCALE** (Patient taking differently: Inject 3-30 Units into the skin 3 (three) times daily with meals. Sliding scale instructions.) 60 mL 2  . pantoprazole (PROTONIX) 40 MG tablet Take 1 tablet (40 mg total) by mouth daily. 30 tablet 0  . polyethylene glycol (MIRALAX / GLYCOLAX) packet Take 17 g by mouth daily as needed. Constipation     . pregabalin (LYRICA) 50 MG capsule Take 50 mg by mouth at bedtime as needed.      No current facility-administered medications for this visit.     Allergies as of 12/20/2018 - Review Complete 12/04/2018  Allergen Reaction Noted  . Cortisone Itching 08/18/2017    No family history on file.  Social History   Socioeconomic History  . Marital status: Married    Spouse name: Not on file  . Number of children: Not on file  . Years of education: Not on file  . Highest education level: Not on file  Occupational History  . Not on file  Social Needs  . Financial resource strain: Not on file  . Food insecurity    Worry: Not on file    Inability: Not on file  . Transportation needs    Medical: Not on file    Non-medical: Not on file  Tobacco Use  . Smoking status: Former Smoker    Types: Cigars  . Smokeless tobacco: Former Neurosurgeon    Quit date: 01/22/1991  Substance and Sexual Activity  . Alcohol use: No  . Drug use: No  . Sexual activity: Not on file  Lifestyle  . Physical activity    Days per week: Not on file    Minutes per session: Not on file  . Stress: Not on file  Relationships  . Social Musician on phone: Not on file    Gets together: Not on file    Attends religious service: Not on file    Active member of club or organization: Not on file    Attends meetings of clubs or organizations: Not on file    Relationship status: Not on file  . Intimate partner violence    Fear of current or ex partner:  Not on file    Emotionally abused: Not on file    Physically abused: Not on file    Forced sexual activity: Not on file  Other Topics Concern  . Not on file  Social History Narrative  . Not on file    Review of Systems:    Constitutional: No fever or chills Cardiovascular: No chest pain Respiratory: No SOB  Gastrointestinal: See HPI and otherwise negative   Physical Exam:  Vital signs: BP (!) 100/58   Pulse 100   Temp 97.8 F (36.6 C)   Ht  (1.93 m)   Wt 240 lb 6.4 oz (109 kg)   BMI 29.26 kg/m   Constitutional:   Pleasant Caucasian male appears to be in NAD, Well developed, Well nourished, alert and cooperative Respiratory: Respirations even and unlabored. Lungs clear to auscultation bilaterally.   No wheezes, crackles, or rhonchi.  Cardiovascular: Normal S1, S2. No MRG. Regular rate and rhythm. No peripheral edema, cyanosis or pallor.  Gastrointestinal:  Soft, nondistended, mild epigastric ttp. No rebound or guarding. Normal bowel sounds. No appreciable masses or hepatomegaly. Psychiatric: Demonstrates good judgement and reason without abnormal affect or behaviors.  RELEVANT LABS AND IMAGING: CBC    Component Value Date/Time   WBC 13.4 (H) 12/04/2018 0104   RBC 4.19 (L) 12/04/2018 0104   HGB 12.6 (L) 12/04/2018 0104   HCT 36.2 (L) 12/04/2018 0104   PLT 404 (H) 12/04/2018 0104   MCV 86.4 12/04/2018 0104   MCH 30.1 12/04/2018 0104   MCHC 34.8 12/04/2018 0104   RDW 11.5 12/04/2018 0104   LYMPHSABS 0.9 12/03/2018 0816   MONOABS 1.1 (H) 12/03/2018 0816   EOSABS 0.0 12/03/2018 0816   BASOSABS 0.0 12/03/2018 0816    CMP     Component Value Date/Time   NA 133 (L) 12/05/2018 0559   K 3.9 12/05/2018 0559   CL 94 (L) 12/05/2018 0559  CO2 25 12/05/2018 0559   GLUCOSE 230 (H) 12/05/2018 0559   BUN 11 12/05/2018 0559   CREATININE 0.94 12/05/2018 0559   CALCIUM 8.0 (L) 12/05/2018 0559   PROT 7.0 12/10/2018 0752   ALBUMIN 3.4 (L) 12/10/2018 0752   AST 48 (H)  12/10/2018 0752   ALT 80 (H) 12/10/2018 0752   ALKPHOS 444 (H) 12/10/2018 0752   BILITOT 0.5 12/10/2018 0752   GFRNONAA >60 12/05/2018 0559   GFRAA >60 12/05/2018 0559    Assessment: 1.  Elevated LFTs: These are improving, patient had repeat labs just prior to his visit which were not back yet at time of his visit today 2.  Nausea and vomiting: Much improved on Reglan and Pantoprazole, thought due to gastroparesis 3.  Screening for colorectal cancer: Has never had a colonoscopy would but would like to wait at least another 2 months to feel better  Plan: 1.  Refilled Reglan 5 mg 3 times daily before meals #90 with 5 refills 2.  Refilled Pantoprazole 40 mg daily #90 with 3 refills 3.  Patient told me he would like to come in for his colonoscopy as he has never had one but would like to wait 2 months from now. 4.  Told the patient to call us if his nausea worsens and is not getting any better after he is back on his medications. 5.  Patient was set a follow-up visit with Dr. Adela LankArmbruster in 2 months.  At that time he can be scheduled for his screening colonoscopy.  Hyacinth MeekerJennifer Alyss Granato, PA-C [facility] 12/20/2018, 2:33 PM  Cc: [name], Michael W, MD

## 2018-12-20 NOTE — Patient Instructions (Signed)
We have sent the following medications to your pharmacy for you to pick up at your convenience: Reglan and Pantoprazole  Thank you for choosing me and [facility].  Dennison Bulla

## 2018-12-21 ENCOUNTER — Other Ambulatory Visit: Payer: Self-pay

## 2018-12-21 ENCOUNTER — Ambulatory Visit (INDEPENDENT_AMBULATORY_CARE_PROVIDER_SITE_OTHER): Payer: PPO | Admitting: Family Medicine

## 2018-12-21 ENCOUNTER — Encounter: Payer: Self-pay | Admitting: Family Medicine

## 2018-12-21 VITALS — BP 120/60 | HR 96 | Temp 98.1°F | Wt 245.0 lb

## 2018-12-21 DIAGNOSIS — R748 Abnormal levels of other serum enzymes: Secondary | ICD-10-CM | POA: Diagnosis not present

## 2018-12-21 DIAGNOSIS — R112 Nausea with vomiting, unspecified: Secondary | ICD-10-CM

## 2018-12-21 DIAGNOSIS — E1140 Type 2 diabetes mellitus with diabetic neuropathy, unspecified: Secondary | ICD-10-CM | POA: Diagnosis not present

## 2018-12-21 DIAGNOSIS — E1165 Type 2 diabetes mellitus with hyperglycemia: Secondary | ICD-10-CM | POA: Diagnosis not present

## 2018-12-21 DIAGNOSIS — D509 Iron deficiency anemia, unspecified: Secondary | ICD-10-CM | POA: Diagnosis not present

## 2018-12-21 DIAGNOSIS — I10 Essential (primary) hypertension: Secondary | ICD-10-CM

## 2018-12-21 DIAGNOSIS — J690 Pneumonitis due to inhalation of food and vomit: Secondary | ICD-10-CM

## 2018-12-21 DIAGNOSIS — IMO0002 Reserved for concepts with insufficient information to code with codable children: Secondary | ICD-10-CM

## 2018-12-21 NOTE — Patient Instructions (Signed)
Let's plan on 2 week follow up and will give flu vaccine and get CXR then.

## 2018-12-21 NOTE — Progress Notes (Signed)
Subjective:     Patient ID: [name]., male   DOB: [birth year], 68 y.o.   MRN: [id number]  HPI   [name] is here for hospital follow-up.  He has chronic problems including hypertension, type 2 diabetes, history of gallstone pancreatitis, fatty liver, peripheral neuropathy, chronic back pain, hyperlipidemia.  He had been actually scheduled to see [place] here on 11 September.  When our nurse went out to check him in she noted that he looked very weak and reported several days of vomiting and diarrhea.  He also had some vital sign instability and at that point we advised him to go straight to the ER.  Patient seems somewhat confused today and somewhat sedated which he relates to his chronic pain medicines which he gets through chronic pain management.  He is accompanied by his wife  Patient was diagnosed with right upper lobe pneumonia and this was felt to be likely aspiration pneumonia.  He was treated with broad-spectrum antibiotics and discharged on Augmentin.  Presenting white count 16,000.  O2 is weaned to room air.  No cough at this time. No fever.   No dyspnea.    Persistent nausea and vomiting several days prior to admission.  GI consulted.  EGD showed Candida esophagitis.Marland Kitchen  He was treated with fluconazole and also started on Reglan and PPI.  No nausea or vomiting at this time and no dysphagia.  Diabetic ketoacidosis with hyponatremia and metabolic acidosis with anion gap.  Improved with fluids.  A1c 9.2% which is high for him.  Discharged on Lantus 60 units twice daily and continues on sliding scale coverage.  He states his blood sugars are much improved since discharge  Hypertension has been stable.  He is on chronic pain management including OxyContin per pain management  Elevated liver functions on admission.  Limited ultrasound showed fatty liver changes.  Prior cholecystectomy.  He had multiple labs including hepatitis C antibody, anti-smooth muscle antibody, antimitochondrial  antibody, ANA, hemochromatosis panel.  Just saw GI yesterday and had repeat liver panel and still has persistent transaminases and alkaline phosphatase.  His albumin has improved from 2.4-3.9.  No further nausea or vomiting.  Denies any abdominal pain currently  Patient had iron deficiency by iron studies.  His ferritin was elevated in the setting of acute infection.  He has never had colonoscopy.  EGD as above.  Reportedly in process of setting up colonoscopy with GI  Past Medical History:  Diagnosis Date  . Arthritis    oa--and ra.  s/p right knee replacement on Jan 28, 2011--plans left knee replacement on 03/04/11.  also severe lower back pain-previous lumbar fusions, also pain in both hips, shoulders   . Aspiration pneumonia (HCC)   . Asthma   . Diabetes mellitus   . Gallstone pancreatitis   . GERD (gastroesophageal reflux disease)   . Hepatitis    pancreatitis 2010  . Hyperlipidemia   . Hypertension    eccho 6/10, EKG 1/11, clearence Dr Caryl Never from 11/28/10 on chart  . Peripheral vascular disease (HCC)    states after arthroscopy left and right was given blood thinners- had symptoms of blood clot but wasnt diagnosed  . Pneumonia    2010  . Sleep apnea    study years ago/ no cpap/ states mild   Past Surgical History:  Procedure Laterality Date  . BACK SURGERY     hx of multiple back surgeries including lumbar fusion S1-L3  . BIOPSY  12/04/2018   Procedure: BIOPSY;  Surgeon: Benancio Deeds, MD;  Location: [facility];  Service: Gastroenterology;;  . Beaulieu Callas    . COLON SURGERY     removal of part of small intestine 1979  . ELBOW SURGERY     3 tendon repairs on right; 5 tendon repairs on left  . ESOPHAGOGASTRODUODENOSCOPY (EGD) WITH PROPOFOL N/A 12/04/2018   Procedure: ESOPHAGOGASTRODUODENOSCOPY (EGD) WITH PROPOFOL;  Surgeon: Benancio Deeds, MD;  Location: [facility];  Service: Gastroenterology;  Laterality: N/A;  . SHOULDER SURGERY     left x 2, right x 3   . TOTAL KNEE ARTHROPLASTY  01/28/2011   Procedure: TOTAL KNEE ARTHROPLASTY;  Surgeon: Shelda Pal;  Location: [facility];  Service: Orthopedics;  Laterality: Right;  femoral nerve block preop.  Marland Kitchen TOTAL KNEE ARTHROPLASTY  03/04/2011   Procedure: TOTAL KNEE ARTHROPLASTY;  Surgeon: Shelda Pal;  Location: [facility];  Service: Orthopedics;  Laterality: Left;    reports that he has never smoked. He quit smokeless tobacco use about 27 years ago.  His smokeless tobacco use included snuff. He reports that he does not drink alcohol or use drugs. family history is not on file. Allergies  Allergen Reactions  . Cortisone Itching    Hives and causes blood sugar to elevate      Review of Systems  Constitutional: Positive for fatigue. Negative for chills and fever.  HENT: Negative for trouble swallowing.   Respiratory: Negative for cough and shortness of breath.   Cardiovascular: Negative for chest pain and leg swelling.  Gastrointestinal: Negative for abdominal pain.  Genitourinary: Negative for dysuria.  Musculoskeletal: Positive for back pain.  Neurological: Negative for dizziness and syncope.       Objective:   Physical Exam Constitutional:      Appearance: Normal appearance.  Cardiovascular:     Rate and Rhythm: Normal rate and regular rhythm.  Pulmonary:     Effort: Pulmonary effort is normal.     Breath sounds: Normal breath sounds.  Abdominal:     Palpations: Abdomen is soft.     Tenderness: There is no abdominal tenderness.  Musculoskeletal:     Right lower leg: No edema.     Left lower leg: No edema.  Neurological:     General: No focal deficit present.     Mental Status: He is alert.        Assessment:     #1 recent right upper lobe pneumonia.  Question of aspiration pneumonia.  Improved following treatment with Augmentin  #2 recent nausea and vomiting.  Suspected gastroparesis.  Amylase and lipase were not checked.  Symptoms improved on Reglan with no extraparametal  side effects currently  #3 recent esophageal candidiasis.  Dysphagia improved following fluconazole  #4 recent poorly controlled diabetes with A1c 9.2%  #5 chronic back pain followed by pain management  #6 hypertension stable and at goal  #7 elevated liver enzymes.  Followed by GI.  Multiple recent studies as above unrevealing.  History of known fatty liver issues.  #8 iron deficiency.  Recent EGD as above.  No history of colonoscopy    Plan:     -Patient needs flu vaccine but he wishes to wait until couple more weeks of recovery before getting that -Recommend follow-up in 2 weeks and repeat chest x-ray then to document clearing -We do recommend he consider colonoscopy and he is in process of getting that set up with GI -Monitor blood sugars closely Follow-up immediately for any recurrent nausea or vomiting, dyspnea, or other  concerns  Kristian Covey MD [facility]

## 2018-12-21 NOTE — Progress Notes (Signed)
Agree with the assessment and plan with the following thoughts: - would continue therapy for treatment of suspected gastroparesis if it has been helping - unclear what is driving the elevation in liver enzymes. He had a rise in AP after antibiotic use, unsure if related to that. Repeat labs show AP slightly decreased but ALT back up to mid 100s. His prior serologic workup negative other than equivocal AMA but PBC seems unlikely. I would like to repeat his LFTs in 2 weeks and see where they trend. If they do not normalize or continue to rise then will recommend a liver biopsy to further evaluate. I will touch base with [name] to coordinate follow up labs in 2 weeks. Thanks

## 2018-12-23 NOTE — Progress Notes (Signed)
Unable to reach patient by phone, wife finally left message to please sent result to My Chart. Done

## 2018-12-28 ENCOUNTER — Ambulatory Visit: Payer: PPO | Admitting: Nurse Practitioner

## 2019-01-03 ENCOUNTER — Other Ambulatory Visit: Payer: Self-pay

## 2019-01-03 ENCOUNTER — Telehealth: Payer: Self-pay

## 2019-01-03 DIAGNOSIS — R748 Abnormal levels of other serum enzymes: Secondary | ICD-10-CM

## 2019-01-03 NOTE — Telephone Encounter (Signed)
Called patient and left message to please come in for 2 week F/U labs that [facility] PA would like him to have done. And to please call back if any questions about this

## 2019-01-03 NOTE — Telephone Encounter (Signed)
-----   Message from Hughie Closs, RN sent at 12/22/2018  8:28 AM EDT ----- Put order in Epic and call pt. For 2 weeks F/U LFTs =01/05/19

## 2019-01-07 ENCOUNTER — Other Ambulatory Visit: Payer: Self-pay

## 2019-01-07 ENCOUNTER — Telehealth: Payer: Self-pay | Admitting: Gastroenterology

## 2019-01-07 ENCOUNTER — Other Ambulatory Visit (INDEPENDENT_AMBULATORY_CARE_PROVIDER_SITE_OTHER): Payer: PPO

## 2019-01-07 DIAGNOSIS — S53402D Unspecified sprain of left elbow, subsequent encounter: Secondary | ICD-10-CM | POA: Diagnosis not present

## 2019-01-07 DIAGNOSIS — G894 Chronic pain syndrome: Secondary | ICD-10-CM | POA: Diagnosis not present

## 2019-01-07 DIAGNOSIS — R748 Abnormal levels of other serum enzymes: Secondary | ICD-10-CM | POA: Diagnosis not present

## 2019-01-07 DIAGNOSIS — Z79891 Long term (current) use of opiate analgesic: Secondary | ICD-10-CM | POA: Diagnosis not present

## 2019-01-07 DIAGNOSIS — M961 Postlaminectomy syndrome, not elsewhere classified: Secondary | ICD-10-CM | POA: Diagnosis not present

## 2019-01-07 LAB — HEPATIC FUNCTION PANEL
ALT: 19 U/L (ref 0–53)
AST: 10 U/L (ref 0–37)
Albumin: 3.8 g/dL (ref 3.5–5.2)
Alkaline Phosphatase: 159 U/L — ABNORMAL HIGH (ref 39–117)
Bilirubin, Direct: 0.1 mg/dL (ref 0.0–0.3)
Total Bilirubin: 0.4 mg/dL (ref 0.2–1.2)
Total Protein: 6.7 g/dL (ref 6.0–8.3)

## 2019-01-07 NOTE — Telephone Encounter (Signed)
Left message to please call back again

## 2019-01-12 ENCOUNTER — Other Ambulatory Visit: Payer: Self-pay

## 2019-01-12 ENCOUNTER — Ambulatory Visit (INDEPENDENT_AMBULATORY_CARE_PROVIDER_SITE_OTHER): Payer: PPO | Admitting: Family Medicine

## 2019-01-12 ENCOUNTER — Encounter: Payer: Self-pay | Admitting: Family Medicine

## 2019-01-12 ENCOUNTER — Ambulatory Visit (INDEPENDENT_AMBULATORY_CARE_PROVIDER_SITE_OTHER): Payer: PPO

## 2019-01-12 VITALS — BP 136/82 | HR 75 | Temp 98.6°F | Ht 76.0 in | Wt 251.3 lb

## 2019-01-12 DIAGNOSIS — B3781 Candidal esophagitis: Secondary | ICD-10-CM | POA: Diagnosis not present

## 2019-01-12 DIAGNOSIS — E1140 Type 2 diabetes mellitus with diabetic neuropathy, unspecified: Secondary | ICD-10-CM

## 2019-01-12 DIAGNOSIS — J189 Pneumonia, unspecified organism: Secondary | ICD-10-CM | POA: Diagnosis not present

## 2019-01-12 DIAGNOSIS — R748 Abnormal levels of other serum enzymes: Secondary | ICD-10-CM

## 2019-01-12 DIAGNOSIS — E1165 Type 2 diabetes mellitus with hyperglycemia: Secondary | ICD-10-CM

## 2019-01-12 DIAGNOSIS — D509 Iron deficiency anemia, unspecified: Secondary | ICD-10-CM | POA: Diagnosis not present

## 2019-01-12 DIAGNOSIS — IMO0002 Reserved for concepts with insufficient information to code with codable children: Secondary | ICD-10-CM

## 2019-01-12 NOTE — Progress Notes (Signed)
Subjective:     Patient ID: [name]., male   DOB: 03-25-1950, 68 y.o.   MRN: [id number]  HPI [name] is here for follow-up from recent hospitalization for right upper lobe pneumonia.  Refer to note from 12/21/2018 for details.  He had right upper lobe pneumonia treated with Augmentin and he is doing much better at this time.  No cough.  No fever.  No dyspnea.  He feels he is finally getting back to baseline.  He also had some recent nausea and vomiting with suspected gastroparesis.  Those symptoms are controlled.  He had Candida esophagitis and that resolved symptomatically with fluconazole.  His diabetes was poorly controlled with A1c 9.2% but he had been very poorly compliant with diet leading up to his admission.  He is done much better since then in reviewing his home blood sugars.  He states that his pain management specialist took him off hydrocodone recently in view of recent elevated liver enzymes.  He had follow-up just 5 days ago and AST and ALT are back to normal and alkaline phosphatase is improving dramatically.  He did have iron deficiency and EGD was unremarkable.  He has colonoscopy scheduled for early December.  He still needs flu vaccine  Past Medical History:  Diagnosis Date  . Arthritis    oa--and ra.  s/p right knee replacement on Jan 28, 2011--plans left knee replacement on 03/04/11.  also severe lower back pain-previous lumbar fusions, also pain in both hips, shoulders   . Aspiration pneumonia ([place])   . Asthma   . Diabetes mellitus   . Gallstone pancreatitis   . GERD (gastroesophageal reflux disease)   . Hepatitis    pancreatitis 2010  . Hyperlipidemia   . Hypertension    eccho 6/10, EKG 1/11, clearence Dr [name] from 11/28/10 on chart  . Peripheral vascular disease ([place])    states after arthroscopy left and right was given blood thinners- had symptoms of blood clot but wasnt diagnosed  . Pneumonia    2010  . Sleep apnea    study years ago/ no cpap/  states mild   Past Surgical History:  Procedure Laterality Date  . BACK SURGERY     hx of multiple back surgeries including lumbar fusion S1-L3  . BIOPSY  12/04/2018   Procedure: BIOPSY;  Surgeon: Yetta Flock, MD;  [facility]: [facility];  Service: Gastroenterology;;  . CHOLECYSTECTOMY    . COLON SURGERY     removal of part of small intestine 1979  . ELBOW SURGERY     3 tendon repairs on right; 5 tendon repairs on left  . ESOPHAGOGASTRODUODENOSCOPY (EGD) WITH PROPOFOL N/A 12/04/2018   Procedure: ESOPHAGOGASTRODUODENOSCOPY (EGD) WITH PROPOFOL;  Surgeon: Yetta Flock, MD;  [facility];  Service: Gastroenterology;  Laterality: N/A;  . SHOULDER SURGERY     left x 2, right x 3  . TOTAL KNEE ARTHROPLASTY  01/28/2011   Procedure: TOTAL KNEE ARTHROPLASTY;  Surgeon: Mauri Pole;  [facility]: [facility];  Service: Orthopedics;  Laterality: Right;  femoral nerve block preop.  Marland Kitchen TOTAL KNEE ARTHROPLASTY  03/04/2011   Procedure: TOTAL KNEE ARTHROPLASTY;  Surgeon: Mauri Pole;  [facility];  Service: Orthopedics;  Laterality: Left;    reports that he has never smoked. He quit smokeless tobacco use about 27 years ago.  His smokeless tobacco use included snuff. He reports that he does not drink alcohol or use drugs. family history is not on file. Allergies  Allergen  Reactions  . Cortisone Itching    Hives and causes blood sugar to elevate     Review of Systems  Constitutional: Negative for chills and fever.  Respiratory: Negative for cough and shortness of breath.   Cardiovascular: Negative for chest pain and leg swelling.  Gastrointestinal: Negative for abdominal pain, nausea and vomiting.  Genitourinary: Negative for dysuria.  Neurological: Negative for dizziness and weakness.  Hematological: Negative for adenopathy.       Objective:   Physical Exam Vitals signs reviewed.  Constitutional:      Appearance: Normal appearance.  Cardiovascular:     Rate and  Rhythm: Normal rate and regular rhythm.  Pulmonary:     Effort: Pulmonary effort is normal.     Breath sounds: Normal breath sounds.  Musculoskeletal:     Right lower leg: No edema.     Left lower leg: No edema.  Neurological:     Mental Status: He is alert.        Assessment:     #1 recent right upper lobe pneumonia-possibly aspiration.  Clinically improved  #2 recent nausea and vomiting possibly related to gastroparesis-resolved  #3 recent Candida esophagitis improved following fluconazole  #4 iron deficiency anemia.  Recent EGD unremarkable.  Colonoscopy pending  #5 elevated liver enzymes which are resolving  #6 poorly controlled type 2 diabetes with neuropathy.  Home blood sugars are improving     Plan:     -Flu vaccine given -We will plan 58-month follow-up and recheck A1c then -Encouraged to keep colonoscopy in December -Repeat chest x-ray to verify resolution of right upper lobe infiltrate and no underlying masses -Follow-up immediately for any fever, dyspnea, or other concerns  Kristian Covey MD [facility]

## 2019-03-04 ENCOUNTER — Other Ambulatory Visit (INDEPENDENT_AMBULATORY_CARE_PROVIDER_SITE_OTHER): Payer: PPO

## 2019-03-04 DIAGNOSIS — R748 Abnormal levels of other serum enzymes: Secondary | ICD-10-CM

## 2019-03-04 DIAGNOSIS — M961 Postlaminectomy syndrome, not elsewhere classified: Secondary | ICD-10-CM | POA: Diagnosis not present

## 2019-03-04 DIAGNOSIS — G894 Chronic pain syndrome: Secondary | ICD-10-CM | POA: Diagnosis not present

## 2019-03-04 DIAGNOSIS — Z79891 Long term (current) use of opiate analgesic: Secondary | ICD-10-CM | POA: Diagnosis not present

## 2019-03-04 DIAGNOSIS — S53402D Unspecified sprain of left elbow, subsequent encounter: Secondary | ICD-10-CM | POA: Diagnosis not present

## 2019-03-04 LAB — HEPATIC FUNCTION PANEL
ALT: 203 U/L — ABNORMAL HIGH (ref 0–53)
AST: 215 U/L — ABNORMAL HIGH (ref 0–37)
Albumin: 4.1 g/dL (ref 3.5–5.2)
Alkaline Phosphatase: 178 U/L — ABNORMAL HIGH (ref 39–117)
Bilirubin, Direct: 0.1 mg/dL (ref 0.0–0.3)
Total Bilirubin: 0.7 mg/dL (ref 0.2–1.2)
Total Protein: 6.7 g/dL (ref 6.0–8.3)

## 2019-03-08 ENCOUNTER — Telehealth: Payer: Self-pay

## 2019-03-08 ENCOUNTER — Other Ambulatory Visit: Payer: Self-pay

## 2019-03-08 DIAGNOSIS — R748 Abnormal levels of other serum enzymes: Secondary | ICD-10-CM

## 2019-03-08 NOTE — Telephone Encounter (Signed)
Left message to please call back. °

## 2019-03-11 ENCOUNTER — Other Ambulatory Visit (INDEPENDENT_AMBULATORY_CARE_PROVIDER_SITE_OTHER): Payer: PPO

## 2019-03-11 ENCOUNTER — Telehealth: Payer: Self-pay | Admitting: Gastroenterology

## 2019-03-11 DIAGNOSIS — R748 Abnormal levels of other serum enzymes: Secondary | ICD-10-CM | POA: Diagnosis not present

## 2019-03-11 LAB — HEPATIC FUNCTION PANEL
ALT: 114 U/L — ABNORMAL HIGH (ref 0–53)
AST: 52 U/L — ABNORMAL HIGH (ref 0–37)
Albumin: 4.2 g/dL (ref 3.5–5.2)
Alkaline Phosphatase: 157 U/L — ABNORMAL HIGH (ref 39–117)
Bilirubin, Direct: 0.1 mg/dL (ref 0.0–0.3)
Total Bilirubin: 0.5 mg/dL (ref 0.2–1.2)
Total Protein: 6.6 g/dL (ref 6.0–8.3)

## 2019-03-11 NOTE — Telephone Encounter (Signed)
[name] can you please let the patient know that his LFTs are improved from previous but not yet normal and remain elevated. I have previously recommended an MRI/MRCP to further evaluate and hopefully they are willing at this time. If negative and his enzymes remain elevated will then need to consider a liver biopsy. Can you help schedule an MRI/MRCP, and if they decline for some reason please let me know, thanks

## 2019-03-12 ENCOUNTER — Other Ambulatory Visit: Payer: Self-pay | Admitting: Family Medicine

## 2019-03-14 ENCOUNTER — Other Ambulatory Visit: Payer: Self-pay

## 2019-03-14 DIAGNOSIS — R748 Abnormal levels of other serum enzymes: Secondary | ICD-10-CM

## 2019-03-14 NOTE — Telephone Encounter (Signed)
See result note.  

## 2019-03-21 ENCOUNTER — Ambulatory Visit (HOSPITAL_COMMUNITY)
Admission: RE | Admit: 2019-03-21 | Discharge: 2019-03-21 | Disposition: A | Discharge status: Home / Self Care | Payer: PPO | Source: Ambulatory Visit | Attending: Gastroenterology | Admitting: Gastroenterology

## 2019-03-21 ENCOUNTER — Other Ambulatory Visit: Payer: Self-pay | Admitting: Gastroenterology

## 2019-03-21 ENCOUNTER — Other Ambulatory Visit: Payer: Self-pay

## 2019-03-21 DIAGNOSIS — K838 Other specified diseases of biliary tract: Secondary | ICD-10-CM | POA: Diagnosis not present

## 2019-03-21 DIAGNOSIS — R748 Abnormal levels of other serum enzymes: Secondary | ICD-10-CM

## 2019-03-21 DIAGNOSIS — R932 Abnormal findings on diagnostic imaging of liver and biliary tract: Secondary | ICD-10-CM | POA: Diagnosis not present

## 2019-03-21 DIAGNOSIS — R7401 Elevation of levels of liver transaminase levels: Secondary | ICD-10-CM

## 2019-03-21 MED ORDER — GADOBUTROL 1 MMOL/ML IV SOLN
10.0000 mL | Freq: Once | INTRAVENOUS | Status: AC | PRN
  Administered 2019-03-21: 10 mL via INTRAVENOUS

## 2019-03-22 ENCOUNTER — Other Ambulatory Visit: Payer: Self-pay

## 2019-03-22 DIAGNOSIS — R7401 Elevation of levels of liver transaminase levels: Secondary | ICD-10-CM

## 2019-03-22 DIAGNOSIS — K838 Other specified diseases of biliary tract: Secondary | ICD-10-CM

## 2019-03-22 DIAGNOSIS — R112 Nausea with vomiting, unspecified: Secondary | ICD-10-CM

## 2019-03-22 DIAGNOSIS — R748 Abnormal levels of other serum enzymes: Secondary | ICD-10-CM

## 2019-03-28 ENCOUNTER — Telehealth: Payer: Self-pay | Admitting: Gastroenterology

## 2019-03-29 NOTE — Telephone Encounter (Signed)
Sorry to hear this. This is a very important test for him. Patty could you follow up with him in a few weeks to discuss rescheduling if he hasn't already done so in the interim? Thanks

## 2019-03-29 NOTE — Telephone Encounter (Signed)
The pt called to cancel the procedure for 1/11. She has no ride and wants to call back to reschedule. I did try to talk with the pt to work out a way for her to keep procedure.  FYI Dr Meridee Score

## 2019-03-29 NOTE — Telephone Encounter (Signed)
Patty, No worries. Please let Dr. Adela Lank and I know when she decides to reschedule. Thank you. GM

## 2019-03-31 ENCOUNTER — Other Ambulatory Visit (HOSPITAL_COMMUNITY): Payer: PPO

## 2019-04-03 ENCOUNTER — Other Ambulatory Visit: Payer: Self-pay | Admitting: Physician Assistant

## 2019-04-04 ENCOUNTER — Encounter (HOSPITAL_COMMUNITY): Admission: RE | Payer: Self-pay | Source: Home / Self Care

## 2019-04-04 ENCOUNTER — Ambulatory Visit (HOSPITAL_COMMUNITY): Admission: RE | Admit: 2019-04-04 | Payer: PPO | Source: Home / Self Care | Admitting: Gastroenterology

## 2019-04-04 SURGERY — UPPER ENDOSCOPIC ULTRASOUND (EUS) RADIAL
Anesthesia: Monitor Anesthesia Care

## 2019-04-05 ENCOUNTER — Encounter: Payer: PPO | Admitting: Gastroenterology

## 2019-04-06 ENCOUNTER — Ambulatory Visit: Payer: PPO | Admitting: Family Medicine

## 2019-04-15 DIAGNOSIS — G894 Chronic pain syndrome: Secondary | ICD-10-CM | POA: Diagnosis not present

## 2019-04-15 DIAGNOSIS — M961 Postlaminectomy syndrome, not elsewhere classified: Secondary | ICD-10-CM | POA: Diagnosis not present

## 2019-04-15 DIAGNOSIS — S53402D Unspecified sprain of left elbow, subsequent encounter: Secondary | ICD-10-CM | POA: Diagnosis not present

## 2019-04-15 DIAGNOSIS — Z79891 Long term (current) use of opiate analgesic: Secondary | ICD-10-CM | POA: Diagnosis not present

## 2019-04-20 ENCOUNTER — Other Ambulatory Visit: Payer: Self-pay

## 2019-04-20 ENCOUNTER — Encounter: Payer: Self-pay | Admitting: Family Medicine

## 2019-04-20 ENCOUNTER — Ambulatory Visit (INDEPENDENT_AMBULATORY_CARE_PROVIDER_SITE_OTHER): Payer: PPO | Admitting: Family Medicine

## 2019-04-20 VITALS — BP 128/72 | HR 70 | Temp 98.5°F | Ht 76.0 in | Wt 261.8 lb

## 2019-04-20 DIAGNOSIS — I10 Essential (primary) hypertension: Secondary | ICD-10-CM | POA: Diagnosis not present

## 2019-04-20 DIAGNOSIS — R748 Abnormal levels of other serum enzymes: Secondary | ICD-10-CM | POA: Diagnosis not present

## 2019-04-20 DIAGNOSIS — E1140 Type 2 diabetes mellitus with diabetic neuropathy, unspecified: Secondary | ICD-10-CM

## 2019-04-20 DIAGNOSIS — E785 Hyperlipidemia, unspecified: Secondary | ICD-10-CM | POA: Diagnosis not present

## 2019-04-20 DIAGNOSIS — IMO0002 Reserved for concepts with insufficient information to code with codable children: Secondary | ICD-10-CM

## 2019-04-20 DIAGNOSIS — E1165 Type 2 diabetes mellitus with hyperglycemia: Secondary | ICD-10-CM | POA: Diagnosis not present

## 2019-04-20 LAB — BASIC METABOLIC PANEL
BUN: 17 mg/dL (ref 6–23)
CO2: 27 mEq/L (ref 19–32)
Calcium: 9.1 mg/dL (ref 8.4–10.5)
Chloride: 101 mEq/L (ref 96–112)
Creatinine, Ser: 1.06 mg/dL (ref 0.40–1.50)
GFR: 69.84 mL/min (ref >60.00)
Glucose, Bld: 424 mg/dL — ABNORMAL HIGH (ref 70–99)
Potassium: 4.5 mEq/L (ref 3.5–5.1)
Sodium: 136 mEq/L (ref 135–145)

## 2019-04-20 LAB — POCT GLYCOSYLATED HEMOGLOBIN (HGB A1C): Hemoglobin A1C: 8.8 % — AB (ref 4.0–5.6)

## 2019-04-20 LAB — LIPID PANEL
Cholesterol: 201 mg/dL — ABNORMAL HIGH (ref 0–200)
HDL: 52.2 mg/dL (ref >39.00)
LDL Cholesterol: 120 mg/dL — ABNORMAL HIGH (ref 0–99)
NonHDL: 148.89
Total CHOL/HDL Ratio: 4
Triglycerides: 143 mg/dL (ref 0.0–149.0)
VLDL: 28.6 mg/dL (ref 0.0–40.0)

## 2019-04-20 LAB — HEPATIC FUNCTION PANEL
ALT: 51 U/L (ref 0–53)
AST: 29 U/L (ref 0–37)
Albumin: 4.3 g/dL (ref 3.5–5.2)
Alkaline Phosphatase: 131 U/L — ABNORMAL HIGH (ref 39–117)
Bilirubin, Direct: 0.2 mg/dL (ref 0.0–0.3)
Total Bilirubin: 0.8 mg/dL (ref 0.2–1.2)
Total Protein: 6.5 g/dL (ref 6.0–8.3)

## 2019-04-20 MED ORDER — JARDIANCE 10 MG PO TABS: 10.0000 mg | ORAL_TABLET | Freq: Every day | ORAL | 5 refills | Status: DC

## 2019-04-20 MED ORDER — ACCU-CHEK GUIDE VI STRP: ORAL_STRIP | 3 refills | Status: DC

## 2019-04-20 NOTE — Patient Instructions (Signed)
DIABETIC SLIDING SCALE (II)  Check blood sugars three times daily prior to meals:  IF BLOOD SUGAR IS:   LESS THAN 100 (NO INSULIN)   100-140 (3 UNITS)   141-180 (6 UNITS)   181-220 (9 UNITS)   221-260 (12 UNITS)   261-300 (15 UNITS)   301-340 (18 UNITS)   MORE THAN 341 (21 UNITS)

## 2019-04-20 NOTE — Progress Notes (Signed)
Subjective:     Patient ID: [name]., male   DOB: 1950-09-21, 69 y.o.   MRN: [id number]  HPI [name] seen for medical follow-up.  He has elevated liver transaminases and has been undergoing work-up for that.  He had been scheduled for colonoscopy but had scheduling conflict and that is being rescheduled.  There have been consideration for possible liver biopsy.  He had multiple labs and also recent MRI of the liver and that was unrevealing.  He has past history of pancreatitis.  He has hypertension, hyperlipidemia currently not treated with statin, history of chronic back pain followed by pain management.  He had recent change from hydrocodone to morphine.  In reviewing medications he is not aware of taking Protonix currently.  His other medicines are as listed.  His Humalog had been listed as 100 units 3 times daily were not sure where that came from.  He is currently taking Humalog inconsistently and by sliding scale  Past Medical History:  Diagnosis Date  . Arthritis    oa--and ra.  s/p right knee replacement on Jan 28, 2011--plans left knee replacement on 03/04/11.  also severe lower back pain-previous lumbar fusions, also pain in both hips, shoulders   . Aspiration pneumonia ([facility])   . Asthma   . Diabetes mellitus   . Gallstone pancreatitis   . GERD (gastroesophageal reflux disease)   . Hepatitis    pancreatitis 2010  . Hyperlipidemia   . Hypertension    eccho 6/10, EKG 1/11, clearence Dr Caryl Never from 11/28/10 on chart  . Peripheral vascular disease ([facility])    states after arthroscopy left and right was given blood thinners- had symptoms of blood clot but wasnt diagnosed  . Pneumonia    2010  . Sleep apnea    study years ago/ no cpap/ states mild   Past Surgical History:  Procedure Laterality Date  . BACK SURGERY     hx of multiple back surgeries including lumbar fusion S1-L3  . BIOPSY  12/04/2018   Procedure: BIOPSY;  Surgeon: Benancio Deeds, MD;   [facility];  Service: Gastroenterology;;  . CHOLECYSTECTOMY    . COLON SURGERY     removal of part of small intestine 1979  . ELBOW SURGERY     3 tendon repairs on right; 5 tendon repairs on left  . ESOPHAGOGASTRODUODENOSCOPY (EGD) WITH PROPOFOL N/A 12/04/2018   Procedure: ESOPHAGOGASTRODUODENOSCOPY (EGD) WITH PROPOFOL;  Surgeon: Benancio Deeds, MD;  [facility];  Service: Gastroenterology;  Laterality: N/A;  . SHOULDER SURGERY     left x 2, right x 3  . TOTAL KNEE ARTHROPLASTY  01/28/2011   Procedure: TOTAL KNEE ARTHROPLASTY;  Surgeon: Shelda Pal;  [facility];  Service: Orthopedics;  Laterality: Right;  femoral nerve block preop.  Marland Kitchen TOTAL KNEE ARTHROPLASTY  03/04/2011   Procedure: TOTAL KNEE ARTHROPLASTY;  Surgeon: Shelda Pal;  [facility];  Service: Orthopedics;  Laterality: Left;    reports that he has never smoked. He quit smokeless tobacco use about 28 years ago.  His smokeless tobacco use included snuff. He reports that he does not drink alcohol or use drugs. family history is not on file. Allergies  Allergen Reactions  . Cortisone Itching    Hives and causes blood sugar to elevate     Review of Systems  Constitutional: Negative for fatigue and unexpected weight change.  Eyes: Negative for visual disturbance.  Respiratory: Negative for cough, chest tightness and shortness  of breath.   Cardiovascular: Negative for chest pain, palpitations and leg swelling.  Gastrointestinal: Negative for abdominal pain.  Endocrine: Negative for polydipsia and polyuria.  Musculoskeletal: Positive for back pain.  Neurological: Negative for dizziness, syncope, weakness, light-headedness and headaches.       Objective:   Physical Exam Constitutional:      Appearance: He is well-developed.  HENT:     Right Ear: External ear normal.     Left Ear: External ear normal.  Eyes:     Pupils: Pupils are equal, round, and reactive to light.  Neck:      Thyroid: No thyromegaly.  Cardiovascular:     Rate and Rhythm: Normal rate and regular rhythm.  Pulmonary:     Effort: Pulmonary effort is normal. No respiratory distress.     Breath sounds: Normal breath sounds. No wheezing or rales.  Musculoskeletal:     Cervical back: Neck supple.  Skin:    Comments: Right foot reveals some subungual hematoma under the first and second toenails but no signs of secondary infection.  Nails intact.  Thickening of the right great toenail consistent with probable onychomycosis  Neurological:     Mental Status: He is alert and oriented to person, place, and time.        Assessment:     #1 type 2 diabetes poorly controlled with A1c today up to 8.8%.  He is not a good candidate for Metformin because of his liver situation and cannot take GLP-1 medications because of his pancreatitis history.  He does have normal renal function.  Already on high-dose basal insulin  #2 elevated liver transaminases currently being worked up by GI #3 hypertension stable and at goal #4 history of dyslipidemia.  He is not a great candidate for statin at this point with elevated transaminases    Plan:     -Consider addition of Jardiance 10 mg once daily -We discussed Humalog.  We have given him sliding scale to use with that -Continue follow-up with GI regarding his transaminase elevations and work-up -3-month follow-up. -Recheck labs with lipids, hepatic, basic metabolic panel  [name] MD [facility] at [facility]

## 2019-04-22 ENCOUNTER — Other Ambulatory Visit: Payer: Self-pay

## 2019-04-22 MED ORDER — ONETOUCH ULTRA 2 W/DEVICE KIT: PACK | 1 refills | Status: AC

## 2019-04-22 MED ORDER — ONETOUCH ULTRASOFT LANCETS MISC: 3 refills | Status: AC

## 2019-04-22 MED ORDER — ONETOUCH ULTRA VI STRP: ORAL_STRIP | 3 refills | Status: AC

## 2019-05-24 ENCOUNTER — Other Ambulatory Visit: Payer: Self-pay | Admitting: Pharmacy Technician

## 2019-05-24 NOTE — Patient Outreach (Addendum)
[facility]) Care Management  05/24/2019  [name] 1950/08/28 [id number]   Incoming call from patient's wife [name] in which she left me a voicemail message requesting help with re enrollment with [employer].In the message she informed she needed a supply of Basaglar until she can get the re re nrollment application mailed back in.  Attempted to call patient back but unfortunately they did not answer the phone, HIPAA compliant voicemail was left.  In basket message sent to [facility] RPh [name] for assistance with obtaining Basaglar if [name] offers an immediate supply voucher and to discuss re enrollment process.  [name] P. [name], CPhT [facility] [employer] [employer]  [phone]

## 2019-05-25 ENCOUNTER — Other Ambulatory Visit: Payer: Self-pay | Admitting: Pharmacist

## 2019-05-25 NOTE — Patient Outreach (Signed)
[facility]) Care Management  05/25/2019  [name] 05/04/1950 [id number]   Patient's wife contacted Pattricia Boss and requested a call back about reapplying for medication assistance for his insulin for 2021.   Since the patient has [employer] PPO and [facility] is no longer contracted for Care Management or Pharmacy Services, an email was sent to:  [email or web address]   [facility] Um department and Allie Bossier) so the patient can be outreached.  [name], PharmD, BCACP [facility] Clinical Pharmacist [phone]

## 2019-06-01 ENCOUNTER — Other Ambulatory Visit: Payer: Self-pay | Admitting: Pharmacist

## 2019-06-01 NOTE — Patient Outreach (Signed)
[facility]) Care Management  06/01/2019  [name] 1950-08-24 [id number]   Patient's wife, [name], called and wanted to know how to reapply the patient for Basaglar through patient assistance.  On 05/25/2019, an email was sent to the [facility] UM team and [name] requesting the patient be outreached.  An email was sent to [name] today. [name] sent the email to [name] who emailed me and said "[name]" from their team had outreached the patient several times unsuccessfully and left messages.  [name] suggested the patient call the [employer] team at [phone] and ask for [facility].  This information was given to [name].  [name], PharmD, BCACP [facility] Clinical Pharmacist [phone]

## 2019-06-10 DIAGNOSIS — E1142 Type 2 diabetes mellitus with diabetic polyneuropathy: Secondary | ICD-10-CM | POA: Diagnosis not present

## 2019-06-10 DIAGNOSIS — G894 Chronic pain syndrome: Secondary | ICD-10-CM | POA: Diagnosis not present

## 2019-06-10 DIAGNOSIS — M961 Postlaminectomy syndrome, not elsewhere classified: Secondary | ICD-10-CM | POA: Diagnosis not present

## 2019-06-10 DIAGNOSIS — Z79891 Long term (current) use of opiate analgesic: Secondary | ICD-10-CM | POA: Diagnosis not present

## 2019-06-22 ENCOUNTER — Other Ambulatory Visit: Payer: Self-pay | Admitting: Family Medicine

## 2019-07-11 ENCOUNTER — Other Ambulatory Visit: Payer: Self-pay | Admitting: *Deleted

## 2019-07-11 NOTE — Patient Outreach (Signed)
[facility]) Care Management Chronic Special Needs Program    07/11/2019  Name: [name]., DOB: [birth year]  MRN: [id number]   [name] is enrolled in a chronic special needs plan for Diabetes.  Health risk assessment not completed by client.  Interdisciplinary care plan created from information in electronic medical record.  Client also has history of HLD.  RN care manager will send introductory letter with interdisciplinary care plan to primary care provider and client, along with education materials.  Assigned RN care manager will follow up in 3-4 months.  Goals    . Client understands the importance of follow-up with providers by attending scheduled visits     Review of medical record indicates client has completed  4 office visits with primary care provider in 2020 and one visit in 2021 Call and schedule a yearly physical and follow up visit as recommended by your health care provider.     Marland Kitchen HEMOGLOBIN A1C < 7.0     Per medical record review Hgb AIC completed on 04/20/19  8.8 Diabetes self management actions as follows- Continue to keep your follow up appointments with your provider and have lab work completed as recommended. Monitor glucose (blood sugar) per health care provider recommendation. Check feet daily Visit health care provider every 3-6 months as directed. It is important to have your Hgb AIC checked every 3-6 months (every 6 months if you are at goal and every 3 months if you are not at goal). Eye exam yearly Carbohydrate controlled meal planning Take diabetes medications as prescribed by health care provider Physical activity RN care manager mailed EMMI education article to client "Diabetes care checklist"  "diabetes and diet"  "diabetes meal planning"     . Maintain timely refills of diabetic medication as prescribed within the year .     Take medications as prescribed. Follow up with your health care provider if you have  any questions. Please contact your assigned RN care manager if you have difficulty obtaining medications.       . Obtain annual  Lipid Profile, LDL-C     Per medical record review, Lipid profile completed on 04/20/19 LDL= 120 The goal for LDL is less than 70mg /dl as you are at high risk for complications. Try to avoid saturated fats, trans-fats and eat more fiber. Continue to follow up with your health care provider for any needed lab work.     . Obtain Annual Eye (retinal)  Exam      Per medical record review, client had eye exam in 2018 Plan to have dilated eye exam every year Plan to schedule your eye exam yearly     . Obtain Annual Foot Exam     Your doctor should check your feet at least once a year. Plan to schedule a foot exam with your health care provider once every year. Check your skin and feet daily for cuts, bruises, redness, blisters or sore.     . Obtain annual screen for micro albuminuria (urine) , nephropathy (kidney problems)     Per medical record review, unable to determine when microalbuminuria completed Continue to obtain yearly physicals and lab checks as recommended by your health care provider. It is important for your health care provider to check your urine for protein at least once every year.     . Obtain Hemoglobin A1C at least 2 times per year     AIC completed 04/20/19 Continue to follow up with health care  provider for any needed/ recommended lab work    . Visit Primary Care Provider or Endocrinologist at least 2 times per year      Client followed up with primary care provider for 4 visits in 2020 and one visit in 2021 Client saw gastrointestinal specialist 2 times in 2020 Client saw pain management provider 7 times in 2020 and one time in 2021      PLAN Outreach client by telephone in 3-4 months  Jacqlyn Larsen [facility] [place] Coordinator [phone]

## 2019-07-20 ENCOUNTER — Other Ambulatory Visit: Payer: Self-pay

## 2019-07-20 ENCOUNTER — Ambulatory Visit (INDEPENDENT_AMBULATORY_CARE_PROVIDER_SITE_OTHER): Payer: HMO | Admitting: Family Medicine

## 2019-07-20 ENCOUNTER — Encounter: Payer: Self-pay | Admitting: Family Medicine

## 2019-07-20 VITALS — BP 126/74 | HR 63 | Temp 97.9°F | Wt 259.6 lb

## 2019-07-20 DIAGNOSIS — IMO0002 Reserved for concepts with insufficient information to code with codable children: Secondary | ICD-10-CM

## 2019-07-20 DIAGNOSIS — K760 Fatty (change of) liver, not elsewhere classified: Secondary | ICD-10-CM | POA: Diagnosis not present

## 2019-07-20 DIAGNOSIS — E1140 Type 2 diabetes mellitus with diabetic neuropathy, unspecified: Secondary | ICD-10-CM | POA: Diagnosis not present

## 2019-07-20 DIAGNOSIS — I10 Essential (primary) hypertension: Secondary | ICD-10-CM | POA: Diagnosis not present

## 2019-07-20 DIAGNOSIS — E1165 Type 2 diabetes mellitus with hyperglycemia: Secondary | ICD-10-CM | POA: Diagnosis not present

## 2019-07-20 DIAGNOSIS — E785 Hyperlipidemia, unspecified: Secondary | ICD-10-CM

## 2019-07-20 LAB — POCT GLYCOSYLATED HEMOGLOBIN (HGB A1C): Hemoglobin A1C: 7.2 % — AB (ref 4.0–5.6)

## 2019-07-20 MED ORDER — ROSUVASTATIN CALCIUM 10 MG PO TABS: 10.0000 mg | ORAL_TABLET | Freq: Every day | ORAL | 3 refills | Status: DC

## 2019-07-20 NOTE — Patient Instructions (Signed)
A1C greatly improved at 7.2%!  Let's go ahead and start the Crestor 10 mg daily  Will check liver panel and cholesterol at follow up.

## 2019-07-20 NOTE — Progress Notes (Signed)
Subjective:     Patient ID: [name]., male   DOB: Dec 29, 1950, 69 y.o.   MRN: [id number]  HPI   Teo has history of type 2 diabetes, fatty liver changes, hyperlipidemia, chronic back pain which is followed by pain management.  Blood sugar was out of control last visit with A1c 8.8%.  We were able to get him on Jardiance 10 mg daily.  He is tolerating well.  Blood sugars much improved.  Fasting blood sugars have averaged 68 over the past several days.  No hypoglycemic symptoms.  A1c today greatly improved at 7.2%  He has history of fatty liver.  He had recent elevated liver transaminases.  These had trended downward and basically were normal with exception of mildly elevated alkaline phosphatase at last visit.  He is currently not on statin.  No history of myalgias with statin therapy.  Hypertension which has been stable.  No headaches.  No dizziness.  No chest pains.  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 Arthritis    oa--and ra.  s/p right knee replacement on Jan 28, 2011--plans left knee replacement on 03/04/11.  also severe lower back pain-previous lumbar fusions, also pain in both hips, shoulders   . Aspiration pneumonia ([place])   . Asthma   . Diabetes mellitus   . Gallstone pancreatitis   . GERD (gastroesophageal reflux disease)   . Hepatitis    pancreatitis 2010  . Hyperlipidemia   . Hypertension    eccho 6/10, EKG 1/11, clearence Dr Elease Hashimoto from 11/28/10 on chart  . Peripheral vascular disease ([place])    states after arthroscopy left and right was given blood thinners- had symptoms of blood clot but wasnt diagnosed  . Pneumonia    2010  . Sleep apnea    study years ago/ no cpap/ states mild   Past Surgical History:  Procedure Laterality Date  . BACK SURGERY     hx of multiple back  surgeries including lumbar fusion S1-L3  . BIOPSY  12/04/2018   Procedure: BIOPSY;  Surgeon: Yetta Flock, MD;  [facility];  Service: Gastroenterology;;  . CHOLECYSTECTOMY    . COLON SURGERY     removal of part of small intestine 1979  . ELBOW SURGERY     3 tendon repairs on right; 5 tendon repairs on left  . ESOPHAGOGASTRODUODENOSCOPY (EGD) WITH PROPOFOL N/A 12/04/2018   Procedure: ESOPHAGOGASTRODUODENOSCOPY (EGD) WITH PROPOFOL;  Surgeon: Yetta Flock, MD;  [facility];  Service: Gastroenterology;  Laterality: N/A;  . SHOULDER SURGERY     left x 2, right x 3  . TOTAL KNEE ARTHROPLASTY  01/28/2011   Procedure: TOTAL KNEE ARTHROPLASTY;  Surgeon: Mauri Pole;  [facility];  Service: Orthopedics;  Laterality: Right;  femoral nerve block preop.  Marland Kitchen TOTAL KNEE ARTHROPLASTY  03/04/2011   Procedure: TOTAL KNEE ARTHROPLASTY;  Surgeon: Mauri Pole;  [facility];  Service: Orthopedics;  Laterality: Left;    reports that he has never smoked. He quit smokeless tobacco use about 28 years ago.  His smokeless tobacco use included snuff. He reports that he does not drink alcohol or use drugs. family history is not on file. Allergies  Allergen Reactions  . Cortisone Itching    Hives and causes blood sugar  to elevate     Review of Systems  Constitutional: Negative for appetite change, fatigue and unexpected weight change.  Eyes: Negative for visual disturbance.  Respiratory: Negative for cough, chest tightness and shortness of breath.   Cardiovascular: Negative for chest pain, palpitations and leg swelling.  Gastrointestinal: Negative for abdominal pain.  Endocrine: Negative for polydipsia and polyuria.  Genitourinary: Negative for enuresis.  Neurological: Negative for dizziness, syncope, weakness, light-headedness and headaches.       Objective:   Physical Exam Constitutional:      Appearance: He is well-developed.  Eyes:     Pupils: Pupils  are equal, round, and reactive to light.  Neck:     Thyroid: No thyromegaly.  Cardiovascular:     Rate and Rhythm: Normal rate and regular rhythm.  Pulmonary:     Effort: Pulmonary effort is normal. No respiratory distress.     Breath sounds: Normal breath sounds. No wheezing or rales.  Musculoskeletal:     Cervical back: Neck supple.     Right lower leg: No edema.     Left lower leg: No edema.  Neurological:     Mental Status: He is alert and oriented to person, place, and time.        Assessment:     #1 type 2 diabetes improved with A1c 7.2% following recent addition of Jardiance.  He is also tighten up his diet.  #2 dyslipidemia.  Goal LDL less than 70.  Recent LDL cholesterol 120.  Currently not on statin  #3 hypertension stable and well-controlled  #4 history of elevated liver transaminases.  These had trended downward and were basically back to normal with regard to AST and ALT at last visit.    Plan:     -Continue low glycemic diet  -Start Crestor 10 mg daily and recheck lipid and hepatic panel with 32-month follow-up  -He is encouraged to lose some weight.  -Set up repeat eye exam  Kristian Covey MD [facility] at [facility]

## 2019-07-22 ENCOUNTER — Telehealth: Payer: Self-pay | Admitting: Family Medicine

## 2019-07-22 NOTE — Telephone Encounter (Signed)
Morrie Sheldon from [employer] needs to know a confirmed diagnoses  Of either diabetes or chronic heart condition. She stated they faxed over a sheet on 4/22 regarding this information and have not heard back. I asked her if she will re-fax, I will keep a look out and will place it in correct folder.     Phone: [phone]

## 2019-07-25 NOTE — Telephone Encounter (Signed)
Called [facility] to advise of update. A representative was advised that pt has Type 2 diabetes and no known chronic heart disease per Dr.Burchette. the representative stated that she will give the message to [name] but this is all they need for right now and also stated if something is needed they will fax it.

## 2019-07-25 NOTE — Telephone Encounter (Signed)
I have not seen any forms.  He definitely has type 2 diabetes.  No chronic heart disease

## 2019-07-25 NOTE — Telephone Encounter (Signed)
Please advise if you have filled this out

## 2019-08-05 DIAGNOSIS — E1142 Type 2 diabetes mellitus with diabetic polyneuropathy: Secondary | ICD-10-CM | POA: Diagnosis not present

## 2019-08-05 DIAGNOSIS — G894 Chronic pain syndrome: Secondary | ICD-10-CM | POA: Diagnosis not present

## 2019-08-05 DIAGNOSIS — M961 Postlaminectomy syndrome, not elsewhere classified: Secondary | ICD-10-CM | POA: Diagnosis not present

## 2019-08-05 DIAGNOSIS — Z79891 Long term (current) use of opiate analgesic: Secondary | ICD-10-CM | POA: Diagnosis not present

## 2019-08-27 ENCOUNTER — Other Ambulatory Visit: Payer: Self-pay | Admitting: Family Medicine

## 2019-09-09 ENCOUNTER — Other Ambulatory Visit: Payer: Self-pay | Admitting: Family Medicine

## 2019-09-20 ENCOUNTER — Other Ambulatory Visit: Payer: Self-pay | Admitting: Family Medicine

## 2019-09-27 ENCOUNTER — Ambulatory Visit (INDEPENDENT_AMBULATORY_CARE_PROVIDER_SITE_OTHER): Payer: HMO | Admitting: Family Medicine

## 2019-09-27 ENCOUNTER — Encounter: Payer: Self-pay | Admitting: Family Medicine

## 2019-09-27 ENCOUNTER — Other Ambulatory Visit: Payer: Self-pay

## 2019-09-27 VITALS — BP 110/68 | HR 63 | Temp 97.8°F | Wt 253.8 lb

## 2019-09-27 DIAGNOSIS — E785 Hyperlipidemia, unspecified: Secondary | ICD-10-CM

## 2019-09-27 DIAGNOSIS — M25511 Pain in right shoulder: Secondary | ICD-10-CM | POA: Diagnosis not present

## 2019-09-27 DIAGNOSIS — E1165 Type 2 diabetes mellitus with hyperglycemia: Secondary | ICD-10-CM | POA: Diagnosis not present

## 2019-09-27 DIAGNOSIS — E1140 Type 2 diabetes mellitus with diabetic neuropathy, unspecified: Secondary | ICD-10-CM

## 2019-09-27 DIAGNOSIS — IMO0002 Reserved for concepts with insufficient information to code with codable children: Secondary | ICD-10-CM

## 2019-09-27 LAB — POCT GLYCOSYLATED HEMOGLOBIN (HGB A1C): Hemoglobin A1C: 7.9 % — AB (ref 4.0–5.6)

## 2019-09-27 NOTE — Progress Notes (Signed)
Established Patient Office Visit  Subjective:  Patient ID: Michael Hanshaw., male    DOB: 1950/09/20  Age: 69 y.o. MRN: [id number]  CC:  Chief Complaint  Patient presents with  . Diabetes  . Shoulder Pain    right    HPI Michael B Soule Jr. presents for medical follow-up.  We had started Jardiance recently and his last A1c was 7.2%.  He has lost another 6 pounds.  He attributes this to increased activity with gardening.  He has new issue of right shoulder pain.  Has had chronic shoulder difficulties in the past has had prior surgery years ago for complicated compound right clavicle fracture.  3 weeks ago his dog tripped him and he fell landing on the right shoulder.  Has had pain with basically abduction and internal rotation since then.  He can reach above head but has great difficulty doing so secondary to pain.  Blood pressures been stable.  Had recent LDL cholesterol that was up and we started back Crestor 10 mg daily which she is tolerating without side effects.  Past Medical History:  Diagnosis Date  . Arthritis    oa--and ra.  s/p right knee replacement on Jan 28, 2011--plans left knee replacement on 03/04/11.  also severe lower back pain-previous lumbar fusions, also pain in both hips, shoulders   . Aspiration pneumonia ([place])   . Asthma   . Diabetes mellitus   . Gallstone pancreatitis   . GERD (gastroesophageal reflux disease)   . Hepatitis    pancreatitis 2010  . Hyperlipidemia   . Hypertension    eccho 6/10, EKG 1/11, clearence Dr Elease Hashimoto from 11/28/10 on chart  . Peripheral vascular disease ([place])    states after arthroscopy left and right was given blood thinners- had symptoms of blood clot but wasnt diagnosed  . Pneumonia    2010  . Sleep apnea    study years ago/ no cpap/ states mild    Past Surgical History:  Procedure Laterality Date  . BACK SURGERY     hx of multiple back surgeries including lumbar fusion S1-L3  . BIOPSY  12/04/2018    Procedure: BIOPSY;  Surgeon: Yetta Flock, MD;  Location: [facility];  Service: Gastroenterology;;  . CHOLECYSTECTOMY    . COLON SURGERY     removal of part of small intestine 1979  . ELBOW SURGERY     3 tendon repairs on right; 5 tendon repairs on left  . ESOPHAGOGASTRODUODENOSCOPY (EGD) WITH PROPOFOL N/A 12/04/2018   Procedure: ESOPHAGOGASTRODUODENOSCOPY (EGD) WITH PROPOFOL;  Surgeon: Yetta Flock, MD;  Location: [facility];  Service: Gastroenterology;  Laterality: N/A;  . SHOULDER SURGERY     left x 2, right x 3  . TOTAL KNEE ARTHROPLASTY  01/28/2011   Procedure: TOTAL KNEE ARTHROPLASTY;  Surgeon: Mauri Pole;  Location: [facility];  Service: Orthopedics;  Laterality: Right;  femoral nerve block preop.  Marland Kitchen TOTAL KNEE ARTHROPLASTY  03/04/2011   Procedure: TOTAL KNEE ARTHROPLASTY;  Surgeon: Mauri Pole;  Location: [facility];  Service: Orthopedics;  Laterality: Left;    Family History  Problem Relation Age of Onset  . Colon cancer Neg Hx   . Esophageal cancer Neg Hx   . Pancreatic cancer Neg Hx   . Stomach cancer Neg Hx   . Liver disease Neg Hx     Social History   Socioeconomic History  . Marital status: Married    Spouse name: Not on file  . Number  of children: Not on file  . Years of education: Not on file  . Highest education level: Not on file  Occupational History  . Not on file  Tobacco Use  . Smoking status: Never Smoker  . Smokeless tobacco: Former Systems developer    Types: Snuff  Vaping Use  . Vaping Use: Never used  Substance and Sexual Activity  . Alcohol use: No  . Drug use: No  . Sexual activity: Not on file  Other Topics Concern  . Not on file  Social History Narrative  . Not on file   Social Determinants of Health   Financial Resource Strain:   . Difficulty of Paying Living Expenses:   Food Insecurity:   . Worried About Charity fundraiser in the Last Year:   . Arboriculturist in the Last Year:   Transportation Needs:   . Lexicographer (Medical):   Marland Kitchen Lack of Transportation (Non-Medical):   Physical Activity:   . Days of Exercise per Week:   . Minutes of Exercise per Session:   Stress:   . Feeling of Stress :   Social Connections:   . Frequency of Communication with Friends and Family:   . Frequency of Social Gatherings with Friends and Family:   . Attends Religious Services:   . Active Member of Clubs or Organizations:   . Attends Archivist Meetings:   Marland Kitchen Marital Status:   Intimate Partner Violence:   . Fear of Current or Ex-Partner:   . Emotionally Abused:   Marland Kitchen Physically Abused:   . Sexually Abused:     Outpatient Medications Prior to Visit  Medication Sig Dispense Refill  . amLODipine (NORVASC) 10 MG tablet TAKE 1 TABLET BY MOUTH EVERY DAY 90 tablet 1  . azelastine (ASTELIN) 0.1 % nasal spray USE 2 SPRAYS IN EACH NOSTRIL TWICE A DAY AS NEEDED FOR ALLERGIES (Patient taking differently: Place 2 sprays into both nostrils 2 (two) times daily as needed for allergies. ) 30 mL 0  . Blood Glucose Monitoring Suppl (ONE TOUCH ULTRA 2) w/Device KIT Use as directed to check blood glucose two times daily. Dx Code E11.65 1 kit 1  . docusate sodium (COLACE) 100 MG capsule Take 100 mg by mouth 3 (three) times daily as needed. Constipation     . empagliflozin (JARDIANCE) 10 MG TABS tablet Take 10 mg by mouth daily before breakfast. 30 tablet 5  . fexofenadine-pseudoephedrine (ALLEGRA-D 12 HOUR) 60-120 MG per tablet Take 1 tablet by mouth 2 (two) times daily. 60 tablet 6  . glucose blood (ONETOUCH ULTRA) test strip Use as instructed to test blood glucose two times daily. Dx Code E11.65 200 each 3  . insulin lispro (HUMALOG) 100 UNIT/ML injection Inject into the skin 3 (three) times daily before meals. Take three times dailhy prior to meals as per sliding scale.    . Insulin Pen Needle 31G X 5 MM MISC Use as directed with Basaglar Kwikpen 100 each 1  . Insulin Pen Needle 31G X 5 MM MISC 1 Device by Does not  apply route 2 (two) times daily. For use with insulin pen 100 each 0  . Lancets (ONETOUCH ULTRASOFT) lancets Use as instructed to check blood glucose two times daily. Dx Code E11.65 200 each 3  . LANTUS SOLOSTAR 100 UNIT/ML Solostar Pen INJECT 60 UNITS INTO THE SKIN TWO TIMES A DAY 15 mL 4  . lisinopril (ZESTRIL) 40 MG tablet TAKE 1 TABLET BY MOUTH EVERY  DAY 90 tablet 1  . methocarbamol (ROBAXIN) 500 MG tablet Take 500 mg by mouth every 8 (eight) hours as needed.    . metoprolol succinate (TOPROL-XL) 100 MG 24 hr tablet TAKE 1 TABLET BY MOUTH EVERY MORNING 90 tablet 2  . morphine (MS CONTIN) 30 MG 12 hr tablet Take 30 mg by mouth every 12 (twelve) hours.   0  . pantoprazole (PROTONIX) 40 MG tablet TAKE 1 TABLET BY MOUTH EVERY DAY 90 tablet 1  . polyethylene glycol (MIRALAX / GLYCOLAX) packet Take 17 g by mouth daily as needed. Constipation     . rosuvastatin (CRESTOR) 10 MG tablet Take 1 tablet (10 mg total) by mouth daily. 90 tablet 3  . metoCLOPramide (REGLAN) 5 MG tablet Take 1 tablet (5 mg total) by mouth 3 (three) times daily before meals. 90 tablet 1   No facility-administered medications prior to visit.    Allergies  Allergen Reactions  . Cortisone Itching    Hives and causes blood sugar to elevate    ROS Review of Systems  Constitutional: Negative for fatigue.  Eyes: Negative for visual disturbance.  Respiratory: Negative for cough, chest tightness and shortness of breath.   Cardiovascular: Negative for chest pain, palpitations and leg swelling.  Neurological: Negative for dizziness, syncope, weakness, light-headedness and headaches.      Objective:    Physical Exam Constitutional:      Appearance: He is well-developed.  HENT:     Right Ear: External ear normal.     Left Ear: External ear normal.  Eyes:     Pupils: Pupils are equal, round, and reactive to light.  Neck:     Thyroid: No thyromegaly.  Cardiovascular:     Rate and Rhythm: Normal rate and regular  rhythm.  Pulmonary:     Effort: Pulmonary effort is normal. No respiratory distress.     Breath sounds: Normal breath sounds. No wheezing or rales.  Musculoskeletal:     Cervical back: Neck supple.     Comments: Very restricted range of motion right shoulder.  He has some tenderness around the acromion region.  No clavicle tenderness.  No AC tenderness.  Neurological:     Mental Status: He is alert and oriented to person, place, and time.     BP 110/68 (BP Location: Left Arm, Patient Position: Sitting, Cuff Size: Large)   Pulse 63   Temp 97.8 F (36.6 C) (Temporal)   Wt 253 lb 12.8 oz (115.1 kg)   SpO2 97%   BMI 30.89 kg/m  Wt Readings from Last 3 Encounters:  09/27/19 253 lb 12.8 oz (115.1 kg)  07/20/19 259 lb 9.6 oz (117.8 kg)  04/20/19 261 lb 12.8 oz (118.8 kg)     Health Maintenance Due  Topic Date Due  . COLONOSCOPY  Never done  . OPHTHALMOLOGY EXAM  04/23/2017    There are no preventive care reminders to display for this patient.  Lab Results  Component Value Date   TSH 4.64 05/30/2010   Lab Results  Component Value Date   WBC 13.4 (H) 12/04/2018   HGB 12.6 (L) 12/04/2018   HCT 36.2 (L) 12/04/2018   MCV 86.4 12/04/2018   PLT 404 (H) 12/04/2018   Lab Results  Component Value Date   NA 136 04/20/2019   K 4.5 04/20/2019   CO2 27 04/20/2019   GLUCOSE 424 (H) 04/20/2019   BUN 17 04/20/2019   CREATININE 1.06 04/20/2019   BILITOT 0.8 04/20/2019   ALKPHOS 131 (  H) 04/20/2019   AST 29 04/20/2019   ALT 51 04/20/2019   PROT 6.5 04/20/2019   ALBUMIN 4.3 04/20/2019   CALCIUM 9.1 04/20/2019   ANIONGAP 14 12/05/2018   GFR 69.84 04/20/2019   Lab Results  Component Value Date   CHOL 201 (H) 04/20/2019   Lab Results  Component Value Date   HDL 52.20 04/20/2019   Lab Results  Component Value Date   LDLCALC 120 (H) 04/20/2019   Lab Results  Component Value Date   TRIG 143.0 04/20/2019   Lab Results  Component Value Date   CHOLHDL 4 04/20/2019    Lab Results  Component Value Date   HGBA1C 7.9 (A) 09/27/2019      Assessment & Plan:   Problem List Items Addressed This Visit      Unprioritized   Type 2 diabetes, uncontrolled, with neuropathy ([place]) - Primary   Relevant Orders   POCT glycosylated hemoglobin (Hb A1C) (Completed)   Hyperlipidemia   Relevant Orders   Lipid panel   Hepatic function panel    Other Visit Diagnoses    Acute pain of right shoulder       Relevant Orders   Ambulatory referral to Sports Medicine    We recommend a sports medicine referral regarding his acute right shoulder pain following fall.  He has very limited range of motion currently secondary to pain and increased risk of adhesive capsulitis  His A1c today is 7.9% which is surprising since he is continue to lose a few more pounds in weight and has been more active..  We discussed options and he would like to give this 3 more months of lifestyle management and reassess before increasing medications  No orders of the defined types were placed in this encounter.   Follow-up: Return in about 3 months (around 12/28/2019).    Carolann Littler, MD

## 2019-09-27 NOTE — Patient Instructions (Signed)
I will set up sports medicine referral.   

## 2019-09-30 DIAGNOSIS — G894 Chronic pain syndrome: Secondary | ICD-10-CM | POA: Diagnosis not present

## 2019-09-30 DIAGNOSIS — E1142 Type 2 diabetes mellitus with diabetic polyneuropathy: Secondary | ICD-10-CM | POA: Diagnosis not present

## 2019-09-30 DIAGNOSIS — Z79891 Long term (current) use of opiate analgesic: Secondary | ICD-10-CM | POA: Diagnosis not present

## 2019-09-30 DIAGNOSIS — M961 Postlaminectomy syndrome, not elsewhere classified: Secondary | ICD-10-CM | POA: Diagnosis not present

## 2019-10-05 ENCOUNTER — Other Ambulatory Visit: Payer: Self-pay | Admitting: *Deleted

## 2019-10-05 NOTE — Patient Outreach (Signed)
  [facility] Care Management Chronic Special Needs Program    10/05/2019  Name: [name]., DOB: 1951/02/04  MRN: [id number]   [name] is enrolled in a chronic special needs plan for Diabetes.  Outreach call to client for initial telephone assessment, no answer to telephone.  PLAN Outreach client within 2-3 weeks  Irving Shows [facility], BSN [facility] RN Care Coordinator, CSNP [phone]

## 2019-10-13 ENCOUNTER — Other Ambulatory Visit: Payer: Self-pay | Admitting: *Deleted

## 2019-10-13 NOTE — Patient Outreach (Signed)
  [facility]) Care Management Chronic Special Needs Program    10/13/2019  Name: Chetan Mehring., DOB: 1951/01/03  MRN: [id number]   Mr. Derris Millan is enrolled in a chronic special needs plan for Diabetes.  Outreach call to client for initial telephone outreach/  2nd attempt, no answer to telephone.  PLAN Outreach client within 2-3 weeks  Irving Shows [facility], BSN [facility] RN Care Coordinator, CSNP [phone]

## 2019-10-21 ENCOUNTER — Ambulatory Visit: Payer: HMO | Admitting: *Deleted

## 2019-10-25 ENCOUNTER — Other Ambulatory Visit: Payer: Self-pay | Admitting: *Deleted

## 2019-10-25 NOTE — Patient Outreach (Signed)
  [facility]) Care Management Chronic Special Needs Program    10/25/2019  Name: Michael Desroches., DOB: 1950-05-22  MRN: [id number]   [name] is enrolled in a chronic special needs plan for Diabetes.  Outreach call to client for initial telephone assessment/ 3rd attempt, no answer to telephone.  Client does not have a health risk assessment on file.  PLAN Outreach in 6 months (obtain HRA) Individualized care plan due 10/24/20  Irving Shows [facility], BSN [facility] RN Care Coordinator, CSNP [phone]

## 2019-11-16 ENCOUNTER — Other Ambulatory Visit: Payer: Self-pay | Admitting: Family Medicine

## 2019-12-02 DIAGNOSIS — M25511 Pain in right shoulder: Secondary | ICD-10-CM | POA: Diagnosis not present

## 2019-12-02 DIAGNOSIS — M961 Postlaminectomy syndrome, not elsewhere classified: Secondary | ICD-10-CM | POA: Diagnosis not present

## 2019-12-02 DIAGNOSIS — Z79891 Long term (current) use of opiate analgesic: Secondary | ICD-10-CM | POA: Diagnosis not present

## 2019-12-02 DIAGNOSIS — G894 Chronic pain syndrome: Secondary | ICD-10-CM | POA: Diagnosis not present

## 2019-12-05 ENCOUNTER — Other Ambulatory Visit (HOSPITAL_COMMUNITY): Payer: Self-pay | Admitting: Anesthesiology

## 2019-12-05 ENCOUNTER — Other Ambulatory Visit: Payer: Self-pay | Admitting: Anesthesiology

## 2019-12-05 DIAGNOSIS — M25519 Pain in unspecified shoulder: Secondary | ICD-10-CM

## 2019-12-14 ENCOUNTER — Ambulatory Visit (HOSPITAL_COMMUNITY)
Admission: RE | Admit: 2019-12-14 | Discharge: 2019-12-14 | Disposition: A | Discharge status: Home / Self Care | Payer: HMO | Source: Ambulatory Visit | Attending: Anesthesiology | Admitting: Anesthesiology

## 2019-12-14 ENCOUNTER — Other Ambulatory Visit: Payer: Self-pay

## 2019-12-14 DIAGNOSIS — M25519 Pain in unspecified shoulder: Secondary | ICD-10-CM | POA: Insufficient documentation

## 2019-12-14 DIAGNOSIS — M25511 Pain in right shoulder: Secondary | ICD-10-CM | POA: Diagnosis not present

## 2019-12-28 ENCOUNTER — Ambulatory Visit: Payer: HMO | Admitting: Family Medicine

## 2020-01-11 ENCOUNTER — Other Ambulatory Visit: Payer: Self-pay | Admitting: Family Medicine

## 2020-01-11 ENCOUNTER — Ambulatory Visit: Payer: HMO | Admitting: Family Medicine

## 2020-01-30 DIAGNOSIS — G894 Chronic pain syndrome: Secondary | ICD-10-CM | POA: Diagnosis not present

## 2020-01-30 DIAGNOSIS — M961 Postlaminectomy syndrome, not elsewhere classified: Secondary | ICD-10-CM | POA: Diagnosis not present

## 2020-01-30 DIAGNOSIS — Z79891 Long term (current) use of opiate analgesic: Secondary | ICD-10-CM | POA: Diagnosis not present

## 2020-01-30 DIAGNOSIS — M25511 Pain in right shoulder: Secondary | ICD-10-CM | POA: Diagnosis not present

## 2020-02-03 ENCOUNTER — Ambulatory Visit (INDEPENDENT_AMBULATORY_CARE_PROVIDER_SITE_OTHER): Payer: HMO | Admitting: Family Medicine

## 2020-02-03 ENCOUNTER — Other Ambulatory Visit: Payer: Self-pay

## 2020-02-03 ENCOUNTER — Encounter: Payer: Self-pay | Admitting: Family Medicine

## 2020-02-03 VITALS — BP 124/80 | HR 67 | Temp 98.2°F | Ht 76.0 in | Wt 252.6 lb

## 2020-02-03 DIAGNOSIS — E1140 Type 2 diabetes mellitus with diabetic neuropathy, unspecified: Secondary | ICD-10-CM

## 2020-02-03 DIAGNOSIS — Z23 Encounter for immunization: Secondary | ICD-10-CM | POA: Diagnosis not present

## 2020-02-03 DIAGNOSIS — E785 Hyperlipidemia, unspecified: Secondary | ICD-10-CM

## 2020-02-03 DIAGNOSIS — I10 Essential (primary) hypertension: Secondary | ICD-10-CM | POA: Diagnosis not present

## 2020-02-03 DIAGNOSIS — E1165 Type 2 diabetes mellitus with hyperglycemia: Secondary | ICD-10-CM | POA: Diagnosis not present

## 2020-02-03 DIAGNOSIS — IMO0002 Reserved for concepts with insufficient information to code with codable children: Secondary | ICD-10-CM

## 2020-02-03 LAB — HEPATIC FUNCTION PANEL
ALT: 125 U/L — ABNORMAL HIGH (ref 0–53)
AST: 120 U/L — ABNORMAL HIGH (ref 0–37)
Albumin: 4.2 g/dL (ref 3.5–5.2)
Alkaline Phosphatase: 121 U/L — ABNORMAL HIGH (ref 39–117)
Bilirubin, Direct: 0.2 mg/dL (ref 0.0–0.3)
Total Bilirubin: 0.9 mg/dL (ref 0.2–1.2)
Total Protein: 6.4 g/dL (ref 6.0–8.3)

## 2020-02-03 LAB — LIPID PANEL
Cholesterol: 117 mg/dL (ref 0–200)
HDL: 52.5 mg/dL (ref >39.00)
LDL Cholesterol: 36 mg/dL (ref 0–99)
NonHDL: 64.88
Total CHOL/HDL Ratio: 2
Triglycerides: 143 mg/dL (ref 0.0–149.0)
VLDL: 28.6 mg/dL (ref 0.0–40.0)

## 2020-02-03 LAB — POCT GLYCOSYLATED HEMOGLOBIN (HGB A1C)
HbA1c POC (<> result, manual entry): 7.8 % (ref 4.0–5.6)
HbA1c, POC (controlled diabetic range): 7.8 % — AB (ref 0.0–7.0)
HbA1c, POC (prediabetic range): 7.8 % — AB (ref 5.7–6.4)
Hemoglobin A1C: 7.8 % — AB (ref 4.0–5.6)

## 2020-02-03 NOTE — Progress Notes (Signed)
Established Patient Office Visit  Subjective:  Patient ID: [name]., male    DOB: [birth year]  Age: 69 y.o. MRN: [id number]  CC:  Chief Complaint  Patient presents with   Follow-up    3 month follow up    HPI [name] presents for physical exam.  He has chronic problems including history of type 2 diabetes, hyperlipidemia, hypertension.  Medications reviewed.  He hopes his blood sugar control be improved.  Last A1c 7.9%.  He has been more diligent with diet.  We had added Jardiance last year to his regimen.  He is currently on lisinopril and amlodipine for hypertension.  Blood pressures been stable.  No recent dizziness.  No chest pains.  We had a Crestor to his regimen last summer.  He needs follow-up lipids and hepatic panel.  Also needs flu vaccine.  He is overdue for eye exam and plans to set that up soon  Past Medical History:  Diagnosis Date   Arthritis    oa--and ra.  s/p right knee replacement on Jan 28, 2011--plans left knee replacement on 03/04/11.  also severe lower back pain-previous lumbar fusions, also pain in both hips, shoulders    Aspiration pneumonia ([facility])    Asthma    Diabetes mellitus    Gallstone pancreatitis    GERD (gastroesophageal reflux disease)    Hepatitis    pancreatitis 2010   Hyperlipidemia    Hypertension    eccho 6/10, EKG 1/11, clearence Dr Elease Hashimoto from 11/28/10 on chart   Peripheral vascular disease ([place])    states after arthroscopy left and right was given blood thinners- had symptoms of blood clot but wasnt diagnosed   Pneumonia    2010   Sleep apnea    study years ago/ no cpap/ states mild    Past Surgical History:  Procedure Laterality Date   BACK SURGERY     hx of multiple back surgeries including lumbar fusion S1-L3   BIOPSY  12/04/2018   Procedure: BIOPSY;  Surgeon: Yetta Flock, MD;  Location: [facility];  Service: Gastroenterology;;   CHOLECYSTECTOMY     COLON SURGERY      removal of part of small intestine 1979   ELBOW SURGERY     3 tendon repairs on right; 5 tendon repairs on left   ESOPHAGOGASTRODUODENOSCOPY (EGD) WITH PROPOFOL N/A 12/04/2018   Procedure: ESOPHAGOGASTRODUODENOSCOPY (EGD) WITH PROPOFOL;  Surgeon: Yetta Flock, MD;  [facility]: [facility];  Service: Gastroenterology;  Laterality: N/A;   SHOULDER SURGERY     left x 2, right x 3   TOTAL KNEE ARTHROPLASTY  01/28/2011   Procedure: TOTAL KNEE ARTHROPLASTY;  Surgeon: Mauri Pole;  Location: [facility];  Service: Orthopedics;  Laterality: Right;  femoral nerve block preop.   TOTAL KNEE ARTHROPLASTY  03/04/2011   Procedure: TOTAL KNEE ARTHROPLASTY;  Surgeon: Mauri Pole;  [facility];  Service: Orthopedics;  Laterality: Left;    Family History  Problem Relation Age of Onset   Colon cancer Neg Hx    Esophageal cancer Neg Hx    Pancreatic cancer Neg Hx    Stomach cancer Neg Hx    Liver disease Neg Hx     Social History   Socioeconomic History   Marital status: Married    Spouse name: Not on file   Number of children: Not on file   Years of education: Not on file   Highest education level: Not on file  Occupational  History   Not on file  Tobacco Use   Smoking status: Never Smoker   Smokeless tobacco: Former Systems developer    Types: Snuff  Vaping Use   Vaping Use: Never used  Substance and Sexual Activity   Alcohol use: No   Drug use: No   Sexual activity: Not on file  Other Topics Concern   Not on file  Social History Narrative   Not on file   Social Determinants of Health   Financial Resource Strain:    Difficulty of Paying Living Expenses: Not on file  Food Insecurity:    Worried About Charity fundraiser in the Last Year: Not on file   [employer] of Food in the Last Year: Not on file  Transportation Needs:    Lack of Transportation (Medical): Not on file   Lack of Transportation (Non-Medical): Not on file  Physical Activity:    Days of  Exercise per Week: Not on file   Minutes of Exercise per Session: Not on file  Stress:    Feeling of Stress : Not on file  Social Connections:    Frequency of Communication with Friends and Family: Not on file   Frequency of Social Gatherings with Friends and Family: Not on file   Attends Religious Services: Not on file   Active Member of [place] or Organizations: Not on file   Attends Archivist Meetings: Not on file   Marital Status: Not on file  Intimate Partner Violence:    Fear of Current or Ex-Partner: Not on file   Emotionally Abused: Not on file   Physically Abused: Not on file   Sexually Abused: Not on file    Outpatient Medications Prior to Visit  Medication Sig Dispense Refill   amLODipine (NORVASC) 10 MG tablet TAKE 1 TABLET BY MOUTH EVERY DAY 90 tablet 1   azelastine (ASTELIN) 0.1 % nasal spray USE 2 SPRAYS IN EACH NOSTRIL TWICE A DAY AS NEEDED FOR ALLERGIES (Patient taking differently: Place 2 sprays into both nostrils 2 (two) times daily as needed for allergies. ) 30 mL 0   Blood Glucose Monitoring Suppl (ONE TOUCH ULTRA 2) w/Device KIT Use as directed to check blood glucose two times daily. Dx Code E11.65 1 kit 1   docusate sodium (COLACE) 100 MG capsule Take 100 mg by mouth 3 (three) times daily as needed. Constipation      fexofenadine-pseudoephedrine (ALLEGRA-D 12 HOUR) 60-120 MG per tablet Take 1 tablet by mouth 2 (two) times daily. 60 tablet 6   glucose blood (ONETOUCH ULTRA) test strip Use as instructed to test blood glucose two times daily. Dx Code E11.65 200 each 3   insulin lispro (HUMALOG) 100 UNIT/ML injection Inject into the skin 3 (three) times daily before meals. Take three times dailhy prior to meals as per sliding scale.     Insulin Pen Needle 31G X 5 MM MISC Use as directed with Basaglar Kwikpen 100 each 1   Insulin Pen Needle 31G X 5 MM MISC 1 Device by Does not apply route 2 (two) times daily. For use with insulin pen 100  each 0   JARDIANCE 10 MG TABS tablet TAKE 1 TABLET BY MOUTH DAILY BEFORE BREAKFAST. 30 tablet 5   Lancets (ONETOUCH ULTRASOFT) lancets Use as instructed to check blood glucose two times daily. Dx Code E11.65 200 each 3   LANTUS SOLOSTAR 100 UNIT/ML Solostar Pen INJECT 60 UNITS INTO THE SKIN TWICE DAILY 120 mL 2   lisinopril (  ZESTRIL) 40 MG tablet TAKE 1 TABLET BY MOUTH EVERY DAY 90 tablet 1   methocarbamol (ROBAXIN) 500 MG tablet Take 500 mg by mouth every 8 (eight) hours as needed.     metoprolol succinate (TOPROL-XL) 100 MG 24 hr tablet TAKE 1 TABLET BY MOUTH EVERY MORNING 90 tablet 2   morphine (MS CONTIN) 30 MG 12 hr tablet Take 30 mg by mouth every 12 (twelve) hours.   0   pantoprazole (PROTONIX) 40 MG tablet TAKE 1 TABLET BY MOUTH EVERY DAY 90 tablet 1   polyethylene glycol (MIRALAX / GLYCOLAX) packet Take 17 g by mouth daily as needed. Constipation      rosuvastatin (CRESTOR) 10 MG tablet Take 1 tablet (10 mg total) by mouth daily. 90 tablet 3   metoCLOPramide (REGLAN) 5 MG tablet Take 1 tablet (5 mg total) by mouth 3 (three) times daily before meals. 90 tablet 1   No facility-administered medications prior to visit.    Allergies  Allergen Reactions   Cortisone Itching    Hives and causes blood sugar to elevate    ROS Review of Systems  Constitutional: Negative for fatigue and unexpected weight change.  Eyes: Negative for visual disturbance.  Respiratory: Negative for cough, chest tightness and shortness of breath.   Cardiovascular: Negative for chest pain, palpitations and leg swelling.  Endocrine: Negative for polydipsia and polyuria.  Neurological: Negative for dizziness, syncope, weakness, light-headedness and headaches.      Objective:    Physical Exam Vitals reviewed.  Constitutional:      Appearance: Normal appearance.  Cardiovascular:     Rate and Rhythm: Normal rate and regular rhythm.  Pulmonary:     Effort: Pulmonary effort is normal.      Breath sounds: Normal breath sounds.  Musculoskeletal:     Right lower leg: No edema.     Left lower leg: No edema.  Neurological:     Mental Status: He is alert.     BP 124/80 (BP Location: Left Arm, Patient Position: Sitting, Cuff Size: Normal)    Pulse 67    Temp 98.2 F (36.8 C) (Oral)    Ht _0  (1.93 m)    Wt 252 lb 9.6 oz (114.6 kg)    SpO2 98%    BMI 30.75 kg/m  Wt Readings from Last 3 Encounters:  02/03/20 252 lb 9.6 oz (114.6 kg)  09/27/19 253 lb 12.8 oz (115.1 kg)  07/20/19 259 lb 9.6 oz (117.8 kg)     Health Maintenance Due  Topic Date Due   COLONOSCOPY  Never done   OPHTHALMOLOGY EXAM  04/23/2017   INFLUENZA VACCINE  10/23/2019    There are no preventive care reminders to display for this patient.  Lab Results  Component Value Date   TSH 4.64 05/30/2010   Lab Results  Component Value Date   WBC 13.4 (H) 12/04/2018   HGB 12.6 (L) 12/04/2018   HCT 36.2 (L) 12/04/2018   MCV 86.4 12/04/2018   PLT 404 (H) 12/04/2018   Lab Results  Component Value Date   NA 136 04/20/2019   K 4.5 04/20/2019   CO2 27 04/20/2019   GLUCOSE 424 (H) 04/20/2019   BUN 17 04/20/2019   CREATININE 1.06 04/20/2019   BILITOT 0.8 04/20/2019   ALKPHOS 131 (H) 04/20/2019   AST 29 04/20/2019   ALT 51 04/20/2019   PROT 6.5 04/20/2019   ALBUMIN 4.3 04/20/2019   CALCIUM 9.1 04/20/2019   ANIONGAP 14 12/05/2018   GFR  69.84 04/20/2019   Lab Results  Component Value Date   CHOL 201 (H) 04/20/2019   Lab Results  Component Value Date   HDL 52.20 04/20/2019   Lab Results  Component Value Date   LDLCALC 120 (H) 04/20/2019   Lab Results  Component Value Date   TRIG 143.0 04/20/2019   Lab Results  Component Value Date   CHOLHDL 4 04/20/2019   Lab Results  Component Value Date   HGBA1C 7.9 (A) 09/27/2019      Assessment & Plan:   Problem List Items Addressed This Visit      Unprioritized   Type 2 diabetes, uncontrolled, with neuropathy ([place])   Relevant Orders    POC HgB A1c   Hyperlipidemia   Relevant Orders   Lipid panel   Hepatic function panel   Essential hypertension    Other Visit Diagnoses    Need for influenza vaccination    -  Primary   Relevant Orders   Flu Vaccine QUAD High Dose([facility])    A1c minimally improved to 7.8% -We discussed options including possibly increasing Jardiance to 25 mg but he declines at this time.  He would like to do this 3 more months and reassess  -Check lipid and hepatic panel today with recent addition of Crestor last year  -Set up diabetic eye exam  -Flu vaccine given  -Continue lisinopril 40 mg daily and amlodipine 10 mg daily for hypertension.  His blood pressure is stable today.  No orders of the defined types were placed in this encounter.   Follow-up: Return in about 3 months (around 05/05/2020).    Carolann Littler, MD

## 2020-02-03 NOTE — Patient Instructions (Signed)
Set up diabetic eye exam soon  A1C slightly improved to 7.8%.

## 2020-02-13 ENCOUNTER — Other Ambulatory Visit: Payer: Self-pay | Admitting: Family Medicine

## 2020-03-06 ENCOUNTER — Other Ambulatory Visit: Payer: Self-pay | Admitting: Family Medicine

## 2020-03-08 ENCOUNTER — Other Ambulatory Visit: Payer: Self-pay | Admitting: *Deleted

## 2020-03-08 NOTE — Patient Outreach (Signed)
  [facility]) Care Management Chronic Special Needs Program    03/08/2020  Name: Michael Sturdivant., DOB: 03-24-1951  MRN: [id number]   [name] is enrolled in a chronic special needs plan for Diabetes.  [facility] care management will continue to provide services for this member through 03/23/20.  [facility] [employer] care management team will assume care 03/24/20.   Irving Shows [facility], BSN [facility] RN Care Coordinator, CSNP [phone]

## 2020-03-09 ENCOUNTER — Telehealth: Payer: Self-pay | Admitting: Family Medicine

## 2020-03-09 NOTE — Progress Notes (Signed)
°  Chronic Care Management   Outreach Note  03/09/2020 Name: [name]. MRN: [id number] DOB: 1950-10-09  Referred by: Kristian Covey, MD Reason for referral : No chief complaint on file.   An unsuccessful telephone outreach was attempted today. The patient was referred to the pharmacist for assistance with care management and care coordination.   Follow Up Plan:   Carley Perdue [employer] Scheduler

## 2020-03-15 ENCOUNTER — Telehealth: Payer: Self-pay | Admitting: Family Medicine

## 2020-03-15 NOTE — Telephone Encounter (Signed)
Left message for patient to call back and schedule Medicare Annual Wellness Visit (AWV) either virtually or in office.   Last AWV no information please schedule at anytime with [facility] Nurse Health Advisor 1 or 2   This should be a 45 minute visit. 

## 2020-03-22 ENCOUNTER — Other Ambulatory Visit: Payer: Self-pay | Admitting: Family Medicine

## 2020-03-27 DIAGNOSIS — Z79891 Long term (current) use of opiate analgesic: Secondary | ICD-10-CM | POA: Diagnosis not present

## 2020-03-27 DIAGNOSIS — M961 Postlaminectomy syndrome, not elsewhere classified: Secondary | ICD-10-CM | POA: Diagnosis not present

## 2020-03-27 DIAGNOSIS — M25511 Pain in right shoulder: Secondary | ICD-10-CM | POA: Diagnosis not present

## 2020-03-27 DIAGNOSIS — G894 Chronic pain syndrome: Secondary | ICD-10-CM | POA: Diagnosis not present

## 2020-03-29 ENCOUNTER — Other Ambulatory Visit: Payer: Self-pay | Admitting: *Deleted

## 2020-05-04 ENCOUNTER — Ambulatory Visit: Payer: HMO | Admitting: Family Medicine

## 2020-05-18 ENCOUNTER — Telehealth: Payer: Self-pay | Admitting: Family Medicine

## 2020-05-18 NOTE — Progress Notes (Signed)
  Chronic Care Management   Note  05/18/2020 Name: [name]. MRN: [id number] DOB: [birth year]  [name]. is a 70 y.o. year old male who is a primary care patient of Burchette, Elberta Fortis, MD. I reached out to [name]. by phone today in response to a referral sent by [name]'s PCP, Burchette, Elberta Fortis, MD.   [name] was given information about Chronic Care Management services today including:  1. CCM service includes personalized support from designated clinical staff supervised by his physician, including individualized plan of care and coordination with other care providers 2. 24/7 contact phone numbers for assistance for urgent and routine care needs. 3. Service will only be billed when office clinical staff spend 20 minutes or more in a month to coordinate care. 4. Only one practitioner may furnish and bill the service in a calendar month. 5. The patient may stop CCM services at any time (effective at the end of the month) by phone call to the office staff.   Patient agreed to services and verbal consent obtained.   Follow up plan:   Carley Perdue [employer] Scheduler

## 2020-05-18 NOTE — Telephone Encounter (Signed)
Left message for patient to call back and schedule Medicare Annual Wellness Visit (AWV) either virtually or in office. No detailed message left   AWVI  please schedule at anytime with [facility] Nurse Health Advisor 1 or 2   This should be a 45 minute visit. 

## 2020-07-02 ENCOUNTER — Encounter: Payer: Self-pay | Admitting: Family Medicine

## 2020-07-02 ENCOUNTER — Ambulatory Visit (INDEPENDENT_AMBULATORY_CARE_PROVIDER_SITE_OTHER): Payer: HMO | Admitting: Family Medicine

## 2020-07-02 ENCOUNTER — Other Ambulatory Visit: Payer: Self-pay

## 2020-07-02 VITALS — BP 130/60 | HR 70 | Temp 97.6°F | Wt 267.2 lb

## 2020-07-02 DIAGNOSIS — E1165 Type 2 diabetes mellitus with hyperglycemia: Secondary | ICD-10-CM | POA: Diagnosis not present

## 2020-07-02 DIAGNOSIS — E1140 Type 2 diabetes mellitus with diabetic neuropathy, unspecified: Secondary | ICD-10-CM

## 2020-07-02 DIAGNOSIS — IMO0002 Reserved for concepts with insufficient information to code with codable children: Secondary | ICD-10-CM

## 2020-07-02 DIAGNOSIS — I10 Essential (primary) hypertension: Secondary | ICD-10-CM | POA: Diagnosis not present

## 2020-07-02 DIAGNOSIS — E785 Hyperlipidemia, unspecified: Secondary | ICD-10-CM

## 2020-07-02 LAB — POCT GLYCOSYLATED HEMOGLOBIN (HGB A1C): Hemoglobin A1C: 7.2 % — AB (ref 4.0–5.6)

## 2020-07-02 NOTE — Progress Notes (Signed)
Established Patient Office Visit  Subjective:  Patient ID: Michael Petrelli., male    DOB: 1951/02/03  Age: 70 y.o. MRN: [id number]  CC:  Chief Complaint  Patient presents with  . Follow-up    diabetes     Michael B Kooy Jr. presents for medical follow-up.  He has type 2 diabetes.   Last A1c 7.8%.  We started Jardiance but he had yeast balanitis.  This cleared up with over-the-counter yeast medication.  He has not been checking blood sugars recently.  He is actually gained a few pounds.  Not very active physically right now.  Limited by chronic pain.  He is followed by pain management and in process of trying to taper down his morphine.  His blood pressures been very stable.  He takes amlodipine 10 mg daily.  Also takes lisinopril 40 mg daily and Toprol-XL 100 mg daily.  He is on Crestor 10 mg daily and lipids were checked last fall and stable  Past Medical History:  Diagnosis Date  . Arthritis    oa--and ra.  s/p right knee replacement on Jan 28, 2011--plans left knee replacement on 03/04/11.  also severe lower back pain-previous lumbar fusions, also pain in both hips, shoulders   . Aspiration pneumonia ([place])   . Asthma   . Diabetes mellitus   . Gallstone pancreatitis   . GERD (gastroesophageal reflux disease)   . Hepatitis    pancreatitis 2010  . Hyperlipidemia   . Hypertension    eccho 6/10, EKG 1/11, clearence Dr Elease Hashimoto from 11/28/10 on chart  . Peripheral vascular disease ([place])    states after arthroscopy left and right was given blood thinners- had symptoms of blood clot but wasnt diagnosed  . Pneumonia    2010  . Sleep apnea    study years ago/ no cpap/ states mild    Past Surgical History:  Procedure Laterality Date  . BACK SURGERY     hx of multiple back surgeries including lumbar fusion S1-L3  . BIOPSY  12/04/2018   Procedure: BIOPSY;  Surgeon: Yetta Flock, MD;  Location: [facility];  Service: Gastroenterology;;  . CHOLECYSTECTOMY    .  COLON SURGERY     removal of part of small intestine 1979  . ELBOW SURGERY     3 tendon repairs on right; 5 tendon repairs on left  . ESOPHAGOGASTRODUODENOSCOPY (EGD) WITH PROPOFOL N/A 12/04/2018   Procedure: ESOPHAGOGASTRODUODENOSCOPY (EGD) WITH PROPOFOL;  Surgeon: Yetta Flock, MD;  Location: [facility];  Service: Gastroenterology;  Laterality: N/A;  . SHOULDER SURGERY     left x 2, right x 3  . TOTAL KNEE ARTHROPLASTY  01/28/2011   Procedure: TOTAL KNEE ARTHROPLASTY;  Surgeon: Mauri Pole;  Location: [facility] ORS;  Service: Orthopedics;  Laterality: Right;  femoral nerve block preop.  Marland Kitchen TOTAL KNEE ARTHROPLASTY  03/04/2011   Procedure: TOTAL KNEE ARTHROPLASTY;  Surgeon: Mauri Pole;  Location: [facility];  Service: Orthopedics;  Laterality: Left;    Family History  Problem Relation Age of Onset  . Colon cancer Neg Hx   . Esophageal cancer Neg Hx   . Pancreatic cancer Neg Hx   . Stomach cancer Neg Hx   . Liver disease Neg Hx     Social History   Socioeconomic History  . Marital status: Married    Spouse name: Not on file  . Number of children: Not on file  . Years of education: Not on file  . Highest education  level: Not on file  Occupational History  . Not on file  Tobacco Use  . Smoking status: Never Smoker  . Smokeless tobacco: Former Systems developer    Types: Snuff  Vaping Use  . Vaping Use: Never used  Substance and Sexual Activity  . Alcohol use: No  . Drug use: No  . Sexual activity: Not on file  Other Topics Concern  . Not on file  Social History Narrative  . Not on file   Social Determinants of Health   Financial Resource Strain: Not on file  Food Insecurity: Not on file  Transportation Needs: Not on file  Physical Activity: Not on file  Stress: Not on file  Social Connections: Not on file  Intimate Partner Violence: Not on file    Outpatient Medications Prior to Visit  Medication Sig Dispense Refill  . amLODipine (NORVASC) 10 MG tablet TAKE 1 TABLET BY  MOUTH EVERY DAY 90 tablet 1  . azelastine (ASTELIN) 0.1 % nasal spray USE 2 SPRAYS IN EACH NOSTRIL TWICE A DAY AS NEEDED FOR ALLERGIES (Patient taking differently: Place 2 sprays into both nostrils 2 (two) times daily as needed for allergies.) 30 mL 0  . B-D UF III MINI PEN NEEDLES 31G X 5 MM MISC USE TWICE A DAY WITH INSULIN PEN 100 each 0  . Blood Glucose Monitoring Suppl (ONE TOUCH ULTRA 2) w/Device KIT Use as directed to check blood glucose two times daily. Dx Code E11.65 1 kit 1  . docusate sodium (COLACE) 100 MG capsule Take 100 mg by mouth 3 (three) times daily as needed. Constipation     . fexofenadine-pseudoephedrine (ALLEGRA-D 12 HOUR) 60-120 MG per tablet Take 1 tablet by mouth 2 (two) times daily. 60 tablet 6  . glucose blood (ONETOUCH ULTRA) test strip Use as instructed to test blood glucose two times daily. Dx Code E11.65 200 each 3  . insulin lispro (HUMALOG) 100 UNIT/ML injection Inject into the skin 3 (three) times daily before meals. Take three times dailhy prior to meals as per sliding scale.    . Insulin Pen Needle 31G X 5 MM MISC Use as directed with Basaglar Kwikpen 100 each 1  . JARDIANCE 10 MG TABS tablet TAKE 1 TABLET BY MOUTH DAILY BEFORE BREAKFAST. 30 tablet 5  . Lancets (ONETOUCH ULTRASOFT) lancets Use as instructed to check blood glucose two times daily. Dx Code E11.65 200 each 3  . LANTUS SOLOSTAR 100 UNIT/ML Solostar Pen INJECT 60 UNITS INTO THE SKIN TWICE DAILY 120 mL 2  . lisinopril (ZESTRIL) 40 MG tablet TAKE 1 TABLET BY MOUTH EVERY DAY 90 tablet 1  . methocarbamol (ROBAXIN) 500 MG tablet Take 500 mg by mouth every 8 (eight) hours as needed.    . metoprolol succinate (TOPROL-XL) 100 MG 24 hr tablet TAKE 1 TABLET BY MOUTH EVERY MORNING 90 tablet 2  . morphine (MS CONTIN) 30 MG 12 hr tablet Take 30 mg by mouth every 12 (twelve) hours.   0  . pantoprazole (PROTONIX) 40 MG tablet TAKE 1 TABLET BY MOUTH EVERY DAY 90 tablet 1  . polyethylene glycol (MIRALAX / GLYCOLAX)  packet Take 17 g by mouth daily as needed. Constipation     . rosuvastatin (CRESTOR) 10 MG tablet Take 1 tablet (10 mg total) by mouth daily. 90 tablet 3  . metoCLOPramide (REGLAN) 5 MG tablet Take 1 tablet (5 mg total) by mouth 3 (three) times daily before meals. 90 tablet 1   No facility-administered medications prior to visit.  Allergies  Allergen Reactions  . Cortisone Itching    Hives and causes blood sugar to elevate  . Jardiance [Empagliflozin] Other (See Comments)    Yeast balanitis    ROS Review of Systems  Constitutional: Negative for fatigue and unexpected weight change.  Eyes: Negative for visual disturbance.  Respiratory: Negative for cough, chest tightness and shortness of breath.   Cardiovascular: Negative for chest pain, palpitations and leg swelling.  Endocrine: Negative for polydipsia and polyuria.  Musculoskeletal: Positive for back pain.  Neurological: Negative for dizziness, syncope, weakness, light-headedness and headaches.      Objective:    Physical Exam Constitutional:      General: He is not in acute distress.    Appearance: He is well-developed.  HENT:     Head: Normocephalic and atraumatic.     Right Ear: External ear normal.     Left Ear: External ear normal.  Eyes:     Conjunctiva/sclera: Conjunctivae normal.     Pupils: Pupils are equal, round, and reactive to light.  Neck:     Thyroid: No thyromegaly.  Cardiovascular:     Rate and Rhythm: Normal rate and regular rhythm.     Heart sounds: Normal heart sounds. No murmur heard.   Pulmonary:     Effort: No respiratory distress.     Breath sounds: No wheezing or rales.  Abdominal:     General: Bowel sounds are normal. There is no distension.     Palpations: Abdomen is soft. There is no mass.     Tenderness: There is no abdominal tenderness. There is no guarding or rebound.  Musculoskeletal:     Cervical back: Normal range of motion and neck supple.  Lymphadenopathy:     Cervical:  No cervical adenopathy.  Skin:    Findings: No rash.  Neurological:     Mental Status: He is alert and oriented to person, place, and time.     Cranial Nerves: No cranial nerve deficit.     Deep Tendon Reflexes: Reflexes normal.     BP 130/60 (BP Location: Left Arm, Patient Position: Sitting, Cuff Size: Normal)   Pulse 70   Temp 97.6 F (36.4 C) (Oral)   Wt 267 lb 3.2 oz (121.2 kg)   SpO2 98%   BMI 32.52 kg/m  Wt Readings from Last 3 Encounters:  07/02/20 267 lb 3.2 oz (121.2 kg)  02/03/20 252 lb 9.6 oz (114.6 kg)  09/27/19 253 lb 12.8 oz (115.1 kg)     Health Maintenance Due  Topic Date Due  . COLONOSCOPY (Pts 45-71yr Insurance coverage will need to be confirmed)  Never done  . OPHTHALMOLOGY EXAM  04/23/2017  . COVID-19 Vaccine (3 - Booster for Pfizer series) 02/22/2020  . PNA vac Low Risk Adult (2 of 2 - PPSV23) 05/07/2020    There are no preventive care reminders to display for this patient.  Lab Results  Component Value Date   TSH 4.64 05/30/2010   Lab Results  Component Value Date   WBC 13.4 (H) 12/04/2018   HGB 12.6 (L) 12/04/2018   HCT 36.2 (L) 12/04/2018   MCV 86.4 12/04/2018   PLT 404 (H) 12/04/2018   Lab Results  Component Value Date   NA 136 04/20/2019   K 4.5 04/20/2019   CO2 27 04/20/2019   GLUCOSE 424 (H) 04/20/2019   BUN 17 04/20/2019   CREATININE 1.06 04/20/2019   BILITOT 0.9 02/03/2020   ALKPHOS 121 (H) 02/03/2020   AST 120 (H) 02/03/2020  ALT 125 (H) 02/03/2020   PROT 6.4 02/03/2020   ALBUMIN 4.2 02/03/2020   CALCIUM 9.1 04/20/2019   ANIONGAP 14 12/05/2018   GFR 69.84 04/20/2019   Lab Results  Component Value Date   CHOL 117 02/03/2020   Lab Results  Component Value Date   HDL 52.50 02/03/2020   Lab Results  Component Value Date   LDLCALC 36 02/03/2020   Lab Results  Component Value Date   TRIG 143.0 02/03/2020   Lab Results  Component Value Date   CHOLHDL 2 02/03/2020   Lab Results  Component Value Date    HGBA1C 7.2 (A) 07/02/2020      Assessment & Plan:   #1 type 2 diabetes slightly improved with A1c 7.2%.  Did not tolerate Jardiance secondary to yeast balanitis.  Fortunately, he has been out of bring this down with dietary change himself -Plan 84-monthfollow-up and recheck A1c then -Overdue for eye exam and he was reminded about this -Also needs 1 more Pneumovax but he declines at this time  #2 hypertension stable and at goal -Continue medications above with amlodipine, lisinopril, metoprolol  #3 dyslipidemia -Continue Crestor 20 mg daily  No orders of the defined types were placed in this encounter.   Follow-up: Return in about 3 months (around 10/01/2020).    BCarolann Littler MD

## 2020-07-02 NOTE — Patient Instructions (Signed)
Type 2 Diabetes Mellitus, Diagnosis, Adult Type 2 diabetes (type 2 diabetes mellitus) is a long-term, or chronic, disease. In type 2 diabetes, one or both of these problems may be present:  The pancreas does not make enough of a hormone called insulin.  Cells in the body do not respond properly to insulin that the body makes (insulin resistance). Normally, insulin allows blood sugar (glucose) to enter cells in the body. The cells use glucose for energy. Insulin resistance or lack of insulin causes excess glucose to build up in the blood instead of going into cells. This causes high blood glucose (hyperglycemia).  What are the causes? The exact cause of type 2 diabetes is not known. What increases the risk? The following factors may make you more likely to develop this condition:  Having a family member with type 2 diabetes.  Being overweight or obese.  Being inactive (sedentary).  Having been diagnosed with insulin resistance.  Having a history of prediabetes, diabetes when you were pregnant (gestational diabetes), or polycystic ovary syndrome (PCOS). What are the signs or symptoms? In the early stage of this condition, you may not have symptoms. Symptoms develop slowly and may include:  Increased thirst or hunger.  Increased urination.  Unexplained weight loss.  Tiredness (fatigue) or weakness.  Vision changes, such as blurry vision.  Dark patches on the skin. How is this diagnosed? This condition is diagnosed based on your symptoms, your medical history, a physical exam, and your blood glucose level. Your blood glucose may be checked with one or more of the following blood tests:  A fasting blood glucose (FBG) test. You will not be allowed to eat (you will fast) for 8 hours or longer before a blood sample is taken.  A random blood glucose test. This test checks blood glucose at any time of day regardless of when you ate.  An A1C (hemoglobin A1C) blood test. This test  provides information about blood glucose levels over the previous 2-3 months.  An oral glucose tolerance test (OGTT). This test measures your blood glucose at two times: ? After fasting. This is your baseline blood glucose level. ? Two hours after drinking a beverage that contains glucose. You may be diagnosed with type 2 diabetes if:  Your fasting blood glucose level is 126 mg/dL (7.0 mmol/L) or higher.  Your random blood glucose level is 200 mg/dL (11.1 mmol/L) or higher.  Your A1C level is 6.5% or higher.  Your oral glucose tolerance test result is higher than 200 mg/dL (11.1 mmol/L). These blood tests may be repeated to confirm your diagnosis.   How is this treated? Your treatment may be managed by a specialist called an endocrinologist. Type 2 diabetes may be treated by following instructions from your health care provider about:  Making dietary and lifestyle changes. These may include: ? Following a personalized nutrition plan that is developed by a registered dietitian. ? Exercising regularly. ? Finding ways to manage stress.  Checking your blood glucose level as often as told.  Taking diabetes medicines or insulin daily. This helps to keep your blood glucose levels in the healthy range.  Taking medicines to help prevent complications from diabetes. Medicines may include: ? Aspirin. ? Medicine to lower cholesterol. ? Medicine to control blood pressure. Your health care provider will set treatment goals for you. Your goals will be based on your age, other medical conditions you have, and how you respond to diabetes treatment. Generally, the goal of treatment is to maintain the   following blood glucose levels:  Before meals: 80-130 mg/dL (4.4-7.2 mmol/L).  After meals: below 180 mg/dL (10 mmol/L).  A1C level: less than 7%. Follow these instructions at home: Questions to ask your health care provider Consider asking the following questions:  Should I meet with a certified  diabetes care and education specialist?  What diabetes medicines do I need, and when should I take them?  What equipment will I need to manage my diabetes at home?  How often do I need to check my blood glucose?  Where can I find a support group for people with diabetes?  What number can I call if I have questions?  When is my next appointment? General instructions  Take over-the-counter and prescription medicines only as told by your health care provider.  Keep all follow-up visits as told by your health care provider. This is important. Where to find more information  American Diabetes Association (ADA): www.diabetes.org  American Association of Diabetes Care and Education Specialists (ADCES): www.diabeteseducator.org  International Diabetes Federation (IDF): www.idf.org Contact a health care provider if:  Your blood glucose is at or above 240 mg/dL (13.3 mmol/L) for 2 days in a row.  You have been sick or have had a fever for 2 days or longer, and you are not getting better.  You have any of the following problems for more than 6 hours: ? You cannot eat or drink. ? You have nausea and vomiting. ? You have diarrhea. Get help right away if:  You have severe hypoglycemia. This means your blood glucose is lower than 54 mg/dL (3.0 mmol/L).  You become confused or you have trouble thinking clearly.  You have difficulty breathing.  You have moderate or large ketone levels in your urine. These symptoms may represent a serious problem that is an emergency. Do not wait to see if the symptoms will go away. Get medical help right away. Call your local emergency services (911 in the [place] Do not drive yourself to the hospital. Summary  Type 2 diabetes (type 2 diabetes mellitus) is a long-term, or chronic, disease. In type 2 diabetes, the pancreas does not make enough of a hormone called insulin, or cells in the body do not respond properly to insulin that the body makes (insulin  resistance).  This condition is treated by making dietary and lifestyle changes and taking diabetes medicines or insulin.  Your health care provider will set treatment goals for you. Your goals will be based on your age, other medical conditions you have, and how you respond to diabetes treatment.  Keep all follow-up visits as told by your health care provider. This is important. This information is not intended to replace advice given to you by your health care provider. Make sure you discuss any questions you have with your health care provider. Document Revised: 10/05/2019 Document Reviewed: 10/05/2019 Elsevier Patient Education  2021 Elsevier Inc.  

## 2020-07-12 ENCOUNTER — Other Ambulatory Visit: Payer: Self-pay | Admitting: Family Medicine

## 2020-07-12 NOTE — Telephone Encounter (Signed)
Pharmacy due to insurance has requested an alternative.

## 2020-07-12 NOTE — Telephone Encounter (Signed)
Make sure he has no hx of statin intolerance.  If not, start Lipitor 20 mg po qd in place of Crestor and take Crestor off med list.         Per Dr. Caryl Never    LVM for call back about above message

## 2020-07-16 ENCOUNTER — Other Ambulatory Visit: Payer: Self-pay

## 2020-07-16 MED ORDER — ATORVASTATIN CALCIUM 20 MG PO TABS: 20.0000 mg | ORAL_TABLET | Freq: Every day | ORAL | 3 refills | Status: DC

## 2020-07-16 NOTE — Telephone Encounter (Signed)
Spoke with patient, no history of statin intolerance.   History of med intolerance was only to [facility].  Rosuvastain has been discontinued, Lipitor 20mg  will be sent to pharmacy in place of.

## 2020-07-16 NOTE — Addendum Note (Signed)
Addended by: Kristian Covey on: 07/16/2020 05:04 PM   Modules accepted: Orders

## 2020-07-16 NOTE — Telephone Encounter (Signed)
Spoke with the pharmacist Rosanne Ashing at [facility].  He checked and it appears that the medication Atorvastatin 20 mg, the  insurance should pay for the medication.    I have tried to send it through again,  It will not go through for me.  I'm not understanding why.

## 2020-07-16 NOTE — Telephone Encounter (Signed)
Dr. Caryl Never when Lipitor 20 mg was choose, a message popped up saying,  "this medication is not on insurance formulary, please choose an alternative."   Please advise this.   Thanks

## 2020-07-18 DIAGNOSIS — Z79891 Long term (current) use of opiate analgesic: Secondary | ICD-10-CM | POA: Diagnosis not present

## 2020-07-18 DIAGNOSIS — G894 Chronic pain syndrome: Secondary | ICD-10-CM | POA: Diagnosis not present

## 2020-07-18 DIAGNOSIS — M961 Postlaminectomy syndrome, not elsewhere classified: Secondary | ICD-10-CM | POA: Diagnosis not present

## 2020-07-18 DIAGNOSIS — M25511 Pain in right shoulder: Secondary | ICD-10-CM | POA: Diagnosis not present

## 2020-07-24 ENCOUNTER — Telehealth: Payer: Self-pay | Admitting: Pharmacist

## 2020-07-24 NOTE — Chronic Care Management (AMB) (Addendum)
Chronic Care Management Pharmacy Assistant   Name: [name].  MRN: [id number] DOB: [birth year]  [name]. is an 70 y.o. year old male who presents for his initial CCM visit with the clinical pharmacist.  Reason for Encounter: Chart Prep for CPP visit for 07/26/20.   Conditions to be addressed/monitored: HTN, HLD, DMII and Anemia Iron deficiency  Primary concerns for visit include: Hypertension, Anemia, Hyperlipidemia and type 2 diabetes.   Recent office visits:  02/03/20 [name] MD (PCP) - no medication changes. Follow up in 3 months.   07/02/20 [name] MD (PCP) - no medication changes. Follow up in 3 months.    Recent consult visits:  None.   Hospital visits:  None in previous 6 months   Fill History:  ATORVASTATIN 20 MG TABLET 07/16/2020 90   JARDIANCE 10 MG TABLET 04/10/2020 30   ONETOUCH ULTRA TEST STRIP 12/27/2019 90   LANTUS SOLOSTAR 100 UNIT/ML 06/26/2020 90   LISINOPRIL 40 MG TABLET 06/25/2020 90   METHOCARBAMOL 500 MG TABLET 07/04/2020 30   METOCLOPRAMIDE 5 MG TABLET 02/01/2019 30   METOPROLOL SUCC ER 100 MG TAB 06/25/2020 90   morphine 15 mg tablet extended release 07/18/2020 30    Medications: Outpatient Encounter Medications as of 07/24/2020  Medication Sig   amLODipine (NORVASC) 10 MG tablet TAKE 1 TABLET BY MOUTH EVERY DAY   atorvastatin (LIPITOR) 20 MG tablet Take 1 tablet (20 mg total) by mouth daily.   azelastine (ASTELIN) 0.1 % nasal spray USE 2 SPRAYS IN EACH NOSTRIL TWICE A DAY AS NEEDED FOR ALLERGIES (Patient taking differently: Place 2 sprays into both nostrils 2 (two) times daily as needed for allergies.)   B-D UF III MINI PEN NEEDLES 31G X 5 MM MISC USE TWICE A DAY WITH INSULIN PEN   Blood Glucose Monitoring Suppl (ONE TOUCH ULTRA 2) w/Device KIT Use as directed to check blood glucose two times daily. Dx Code E11.65   docusate sodium (COLACE) 100 MG capsule Take 100 mg by mouth 3 (three)  times daily as needed. Constipation    fexofenadine-pseudoephedrine (ALLEGRA-D 12 HOUR) 60-120 MG per tablet Take 1 tablet by mouth 2 (two) times daily.   glucose blood (ONETOUCH ULTRA) test strip Use as instructed to test blood glucose two times daily. Dx Code E11.65   insulin lispro (HUMALOG) 100 UNIT/ML injection Inject into the skin 3 (three) times daily before meals. Take three times dailhy prior to meals as per sliding scale.   Insulin Pen Needle 31G X 5 MM MISC Use as directed with Basaglar Kwikpen   JARDIANCE 10 MG TABS tablet TAKE 1 TABLET BY MOUTH DAILY BEFORE BREAKFAST.   Lancets (ONETOUCH ULTRASOFT) lancets Use as instructed to check blood glucose two times daily. Dx Code E11.65   LANTUS SOLOSTAR 100 UNIT/ML Solostar Pen INJECT 60 UNITS INTO THE SKIN TWICE DAILY   lisinopril (ZESTRIL) 40 MG tablet TAKE 1 TABLET BY MOUTH EVERY DAY   methocarbamol (ROBAXIN) 500 MG tablet Take 500 mg by mouth every 8 (eight) hours as needed.   metoCLOPramide (REGLAN) 5 MG tablet Take 1 tablet (5 mg total) by mouth 3 (three) times daily before meals.   metoprolol succinate (TOPROL-XL) 100 MG 24 hr tablet TAKE 1 TABLET BY MOUTH EVERY MORNING   morphine (MS CONTIN) 30 MG 12 hr tablet Take 30 mg by mouth every 12 (twelve) hours.    pantoprazole (PROTONIX) 40 MG tablet TAKE 1 TABLET BY MOUTH EVERY  DAY   polyethylene glycol (MIRALAX / GLYCOLAX) packet Take 17 g by mouth daily as needed. Constipation    No facility-administered encounter medications on file as of 07/24/2020.     Unable to reach patient.    Star Rating Drugs:  Atorvastatin 72m - [facility]/20 [facility] Jardiance 121m- [facility]/10/20 [facility] Lisinopril 4059m- 06/25/20 [facility] [phone]

## 2020-07-25 NOTE — Progress Notes (Deleted)
Chronic Care Management Pharmacy Note  07/25/2020 Name:  [name]. MRN:  [id number] DOB:  1950-12-31  Subjective: Philbert Riser. is an 70 y.o. year old male who is a primary patient of Burchette, Alinda Sierras, MD.  The CCM team was consulted for assistance with disease management and care coordination needs.    Engaged with patient by telephone for initial visit in response to provider referral for pharmacy case management and/or care coordination services.   Consent to Services:  The patient was given the following information about Chronic Care Management services today, agreed to services, and gave verbal consent: 1. CCM service includes personalized support from designated clinical staff supervised by the primary care provider, including individualized plan of care and coordination with other care providers 2. 24/7 contact phone numbers for assistance for urgent and routine care needs. 3. Service will only be billed when office clinical staff spend 20 minutes or more in a month to coordinate care. 4. Only one practitioner may furnish and bill the service in a calendar month. 5.The patient may stop CCM services at any time (effective at the end of the month) by phone call to the office staff. 6. The patient will be responsible for cost sharing (co-pay) of up to 20% of the service fee (after annual deductible is met). Patient agreed to services and consent obtained.  Patient Care Team: [name], MD as PCP - General Viona Gilmore, [facility] as Pharmacist (Pharmacist)  Recent office visits: 07/02/20 [name] MD (PCP): Patient presented for DM follow up. Stopped Jardiance due to yeast infection. No medication changes. Follow up in 3 months.   02/03/20 [name] MD (PCP): Patient presented for DM follow up. No medication changes. Follow up in 3 months.   Recent consult visits: None  Hospital visits: None in previous 6 months  Objective:  Lab  Results  Component Value Date   CREATININE 1.06 04/20/2019   BUN 17 04/20/2019   GFR 69.84 04/20/2019   GFRNONAA >60 12/05/2018   GFRAA >60 12/05/2018   NA 136 04/20/2019   K 4.5 04/20/2019   CALCIUM 9.1 04/20/2019   CO2 27 04/20/2019   GLUCOSE 424 (H) 04/20/2019    Lab Results  Component Value Date/Time   HGBA1C 7.2 (A) 07/02/2020 08:14 AM   HGBA1C 7.8 (A) 02/03/2020 07:40 AM   HGBA1C 7.8 02/03/2020 07:40 AM   HGBA1C 7.8 (A) 02/03/2020 07:40 AM   HGBA1C 7.8 (A) 02/03/2020 07:40 AM   HGBA1C 9.2 (H) 12/04/2018 01:04 AM   HGBA1C 9.4 (H) 12/03/2018 01:24 PM   GFR 69.84 04/20/2019 07:54 AM   GFR 76.17 01/27/2018 07:42 AM   MICROALBUR 0.4 06/14/2009 08:27 AM    Last diabetic Eye exam:  Lab Results  Component Value Date/Time   HMDIABEYEEXA No Retinopathy 04/23/2016 12:00 AM    Last diabetic Foot exam:  Lab Results  Component Value Date/Time   HMDIABFOOTEX normal 07/06/2014 12:00 AM     Lab Results  Component Value Date   CHOL 117 02/03/2020   HDL 52.50 02/03/2020   LDLCALC 36 02/03/2020   LDLDIRECT 102.0 01/27/2018   TRIG 143.0 02/03/2020   CHOLHDL 2 02/03/2020    Hepatic Function Latest Ref Rng & Units 02/03/2020 04/20/2019 03/11/2019  Total Protein 6.0 - 8.3 g/dL 6.4 6.5 6.6  Albumin 3.5 - 5.2 g/dL 4.2 4.3 4.2  AST 0 - 37 U/L 120(H) 29 52(H)  ALT 0 - 53 U/L 125(H) 51 114(H)  Alk Phosphatase 39 - 117 U/L 121(H) 131(H) 157(H)  Total Bilirubin 0.2 - 1.2 mg/dL 0.9 0.8 0.5  Bilirubin, Direct 0.0 - 0.3 mg/dL 0.2 0.2 0.1    Lab Results  Component Value Date/Time   TSH 4.64 05/30/2010 12:36 PM    CBC Latest Ref Rng & Units 12/04/2018 12/03/2018 04/18/2011  WBC 4.0 - 10.5 K/uL 13.4(H) 16.1(H) 8.1  Hemoglobin 13.0 - 17.0 g/dL 12.6(L) 13.9 13.4  Hematocrit 39.0 - 52.0 % 36.2(L) 39.9 39.6  Platelets 150 - 400 K/uL 404(H) 480(H) 321.0    No results found for: VD25OH  Clinical ASCVD: {YES/NO:21197} The ASCVD Risk score Mikey Bussing DC Jr., et al., 2013) failed to calculate  for the following reasons:   The valid total cholesterol range is 130 to 320 mg/dL    Depression screen [facility] 2/9 07/20/2019  Decreased Interest 0  Down, Depressed, Hopeless 0  PHQ - 2 Score 0     ***Other: (CHADS2VASc if Afib, MMRC or CAT for COPD, ACT, DEXA)  Social History   Tobacco Use  Smoking Status Never Smoker  Smokeless Tobacco Former Systems developer  . Types: Snuff   BP Readings from Last 3 Encounters:  07/02/20 130/60  02/03/20 124/80  09/27/19 110/68   Pulse Readings from Last 3 Encounters:  07/02/20 70  02/03/20 67  09/27/19 63   Wt Readings from Last 3 Encounters:  07/02/20 267 lb 3.2 oz (121.2 kg)  02/03/20 252 lb 9.6 oz (114.6 kg)  09/27/19 253 lb 12.8 oz (115.1 kg)   BMI Readings from Last 3 Encounters:  07/02/20 32.52 kg/m  02/03/20 30.75 kg/m  09/27/19 30.89 kg/m    Assessment/Interventions: Review of patient past medical history, allergies, medications, health status, including review of consultants reports, laboratory and other test data, was performed as part of comprehensive evaluation and provision of chronic care management services.   SDOH:  (Social Determinants of Health) assessments and interventions performed: {yes/no:20286}  SDOH Screenings   Alcohol Screen: Not on file  Depression (WUJ8-1): Not on file  Financial Resource Strain: Not on file  Food Insecurity: Not on file  Housing: Not on file  Physical Activity: Not on file  Social Connections: Not on file  Stress: Not on file  Tobacco Use: Medium Risk  . Smoking Tobacco Use: Never Smoker  . Smokeless Tobacco Use: Former Soil scientist Needs: Not on file    [place]  Allergies  Allergen Reactions  . Cortisone Itching    Hives and causes blood sugar to elevate  . Jardiance [Empagliflozin] Other (See Comments)    Yeast balanitis    Medications Reviewed Today    Reviewed by [name], MD (Physician) on 07/02/20 at 0828  Med List Status: <None>  Medication  Order Taking? Sig Documenting Provider Last Dose Status Informant  amLODipine (NORVASC) 10 MG tablet [id number] Yes TAKE 1 TABLET BY MOUTH EVERY DAY Burchette, Alinda Sierras, MD Taking Active   azelastine (ASTELIN) 0.1 % nasal spray [id number] Yes USE 2 SPRAYS IN EACH NOSTRIL TWICE A DAY AS NEEDED FOR ALLERGIES  Patient taking differently: Place 2 sprays into both nostrils 2 (two) times daily as needed for allergies.   [name], MD Taking Active Self  B-D UF III MINI PEN NEEDLES 31G X 5 MM MISC [id number] Yes USE TWICE A DAY WITH INSULIN PEN Burchette, Alinda Sierras, MD Taking Active   Blood Glucose Monitoring Suppl (ONE TOUCH ULTRA 2) w/Device KIT [id number] Yes Use as directed to check blood glucose  two times daily. Dx Code E11.65 [name], MD Taking Active   docusate sodium (COLACE) 100 MG capsule [id number] Yes Take 100 mg by mouth 3 (three) times daily as needed. Constipation  [provider] Taking Active Self  fexofenadine-pseudoephedrine (ALLEGRA-D 12 HOUR) 60-120 MG per tablet [id number] Yes Take 1 tablet by mouth 2 (two) times daily. [name], MD Taking Active Self  glucose blood (ONETOUCH ULTRA) test strip [id number] Yes Use as instructed to test blood glucose two times daily. Dx Code D35.70 [name], MD Taking Active   insulin lispro (HUMALOG) 100 UNIT/ML injection [id number] Yes Inject into the skin 3 (three) times daily before meals. Take three times dailhy prior to meals as per sliding scale. [provider] Taking Active   Insulin Pen Needle 31G X 5 MM MISC [id number] Yes Use as directed with Basaglar Darnelle Bos, MD Taking Active Self  JARDIANCE 10 MG TABS tablet [id number] Yes TAKE 1 TABLET BY MOUTH DAILY BEFORE BREAKFAST. [name], MD Taking Active   Lancets [facility] ULTRASOFT) lancets [id number] Yes Use as instructed to check blood glucose two times daily. Dx Code E11.65 [name], MD Taking Active   LANTUS  SOLOSTAR 100 UNIT/ML Solostar Pen [id number] Yes INJECT 60 UNITS INTO THE SKIN TWICE DAILY Burchette, Alinda Sierras, MD Taking Active   lisinopril (ZESTRIL) 40 MG tablet [id number] Yes TAKE 1 TABLET BY MOUTH EVERY DAY Burchette, Alinda Sierras, MD Taking Active   methocarbamol (ROBAXIN) 500 MG tablet [id number] Yes Take 500 mg by mouth every 8 (eight) hours as needed. [provider] Taking Active   metoCLOPramide (REGLAN) 5 MG tablet [id number]  Take 1 tablet (5 mg total) by mouth 3 (three) times daily before meals. [place] Erp, [place]  Expired 01/19/19 2359   metoprolol succinate (TOPROL-XL) 100 MG 24 hr tablet [id number] Yes TAKE 1 TABLET BY MOUTH EVERY MORNING Burchette, Alinda Sierras, MD Taking Active   morphine (MS CONTIN) 30 MG 12 hr tablet [id number] Yes Take 30 mg by mouth every 12 (twelve) hours.  [provider] Taking Active Self  pantoprazole (PROTONIX) 40 MG tablet [id number] Yes TAKE 1 TABLET BY MOUTH EVERY DAY Armbruster, Carlota Raspberry, MD Taking Active   polyethylene glycol [facility] / GLYCOLAX) packet [id number] Yes Take 17 g by mouth daily as needed. Constipation  [provider] Taking Active Self  rosuvastatin (CRESTOR) 10 MG tablet [id number] Yes Take 1 tablet (10 mg total) by mouth daily. [name], MD Taking Active           Patient Active Problem List   Diagnosis Date Noted  . Candida esophagitis ([place]) 01/12/2019  . Aspiration into airway 12/03/2018  . Aspiration pneumonia ([place]) 12/03/2018  . DKA (diabetic ketoacidoses) 12/03/2018  . Nausea and vomiting   . Elevated liver enzymes   . Fatty liver 07/27/2018  . Type 2 diabetes, uncontrolled, with neuropathy ([place]) 02/02/2015  . Obesity (BMI 30-39.9) 03/03/2013  . Onychomycosis 10/01/2012  . S/P left total knee replacement 03/05/2011  . Herpes simplex type 2 infection 11/28/2010  . ERECTILE DYSFUNCTION, ORGANIC 04/10/2010  . GALLSTONE PANCREATITIS 01/31/2010  . Hyperlipidemia 04/24/2009  . ANEMIA-IRON  DEFICIENCY 09/29/2008  . Essential hypertension 09/29/2008  . ANEMIA, HX OF 09/29/2008    Immunization History  Administered Date(s) Administered  . Fluad Quad(high Dose 65+) 02/03/2020  . Influenza Split 01/17/2011  . Influenza Whole 01/26/2009, 01/02/2010  . Influenza, High Dose Seasonal PF 01/27/2018  . Influenza, Seasonal, Injecte,  Preservative Fre 02/24/2012  . Influenza,inj,Quad PF,6+ Mos 03/03/2013, 02/06/2014, 02/02/2015, 01/30/2016, 12/31/2016  . PFIZER(Purple Top)SARS-COV-2 Vaccination 07/23/2019, 08/23/2019, 03/10/2020  . Pneumococcal Conjugate-13 01/27/2018  . Pneumococcal Polysaccharide-23 05/08/2015  . Tdap 02/15/2013    Conditions to be addressed/monitored:  Hypertension, Hyperlipidemia, Diabetes, GERD, Allergic Rhinitis and Pain  There are no care plans that you recently modified to display for this patient.  Current Barriers:  . {pharmacybarriers:24917}  Pharmacist Clinical Goal(s):  Marland Kitchen Patient will {PHARMACYGOALCHOICES:24921} through collaboration with PharmD and provider.   Interventions: . 1:1 collaboration with [name], MD regarding development and update of comprehensive plan of care as evidenced by provider attestation and co-signature . Inter-disciplinary care team collaboration (see longitudinal plan of care) . Comprehensive medication review performed; medication list updated in electronic medical record  BP Readings from Last 3 Encounters:  07/02/20 130/60  02/03/20 124/80  09/27/19 110/68    Hypertension (BP goal <130/80) -Controlled -Current treatment: . Amlodipine 10 mg 1 tablet daily . Lisinopril 40 mg 1 tablet daily . Metoprolol succinate 100 mg 1 tablet daily -Medications previously tried: ***  -Current home readings: *** -Current dietary habits: *** -Current exercise habits: *** -{ACTIONS;DENIES/REPORTS:21021675::"Denies"} hypotensive/hypertensive symptoms -Educated on {CCM BP Counseling:25124} -Counseled to monitor BP  at home ***, document, and provide log at future appointments -{CCMPHARMDINTERVENTION:25122}  The ASCVD Risk score Mikey Bussing DC Jr., et al., 2013) failed to calculate for the following reasons:   The valid total cholesterol range is 130 to 320 mg/dL  Lab Results  Component Value Date   CHOL 117 02/03/2020   HDL 52.50 02/03/2020   LDLCALC 36 02/03/2020   LDLDIRECT 102.0 01/27/2018   TRIG 143.0 02/03/2020   CHOLHDL 2 02/03/2020     Hyperlipidemia: (LDL goal < 70) -Controlled -Current treatment: . Atorvastatin 20 mg 1 tablet daily -Medications previously tried: ***  -Current dietary patterns: *** -Current exercise habits: *** -Educated on {CCM HLD Counseling:25126} -{CCMPHARMDINTERVENTION:25122}  Diabetes (A1c goal <7%) -Uncontrolled -Current medications: . Lantus 100 mg/unit Inject 60 units twice daily . Humalog inject three times daily - sliding scale? -Medications previously tried: Jardiance (yeast infection) -Current home glucose readings . fasting glucose: *** . post prandial glucose: *** -{ACTIONS;DENIES/REPORTS:21021675::"Denies"} hypoglycemic/hyperglycemic symptoms -Current meal patterns:  . breakfast: ***  . lunch: ***  . dinner: *** . snacks: *** . drinks: *** -Current exercise: *** -Educated on {CCM DM COUNSELING:25123} -Counseled to check feet daily and get yearly eye exams -{CCMPHARMDINTERVENTION:25122}  GERD (Goal: ***) -{US controlled/uncontrolled:25276} -Current treatment  . Pantoprazole 40 mg 1 tablet daily . Metoclopramide? -Medications previously tried: ***  -{CCMPHARMDINTERVENTION:25122}   Allergic rhinitis (Goal: ***) -{US controlled/uncontrolled:25276} -Current treatment  . Allegra-D twice daily - taking? . Azelastine 0.1% nasal spray 2 sprays daily as needed -Medications previously tried: ***  -{CCMPHARMDINTERVENTION:25122}  Pain/muscle spasms (Goal: ***) -{US controlled/uncontrolled:25276} -Current treatment  . Methocarbamol 500 mg 1  tablet every 8 hours as needed . MS Contin 30 mg 1 tablet every 12 hours -Medications previously tried: ***  -{CCMPHARMDINTERVENTION:25122}  Vaccines   Reviewed and discussed patient's vaccination history.    Immunization History  Administered Date(s) Administered  . Fluad Quad(high Dose 65+) 02/03/2020  . Influenza Split 01/17/2011  . Influenza Whole 01/26/2009, 01/02/2010  . Influenza, High Dose Seasonal PF 01/27/2018  . Influenza, Seasonal, Injecte, Preservative Fre 02/24/2012  . Influenza,inj,Quad PF,6+ Mos 03/03/2013, 02/06/2014, 02/02/2015, 01/30/2016, 12/31/2016  . PFIZER(Purple Top)SARS-COV-2 Vaccination 07/23/2019, 08/23/2019, 03/10/2020  . Pneumococcal Conjugate-13 01/27/2018  . Pneumococcal Polysaccharide-23 05/08/2015  . Tdap 02/15/2013  Plan  Recommended patient receive *** vaccine in *** office.    Health Maintenance -Vaccine gaps: shingles, Pneumovax 23 -Current therapy:  . Docusate 100 mg 1 capsule as needed . Miralax packet as needed -Educated on {ccm supplement counseling:25128} -{CCM Patient satisfied:25129} -{CCMPHARMDINTERVENTION:25122}   Patient Goals/Self-Care Activities . Patient will:  - {pharmacypatientgoals:24919}  Follow Up Plan: {CM FOLLOW UP NOBS:96283}    Medication Assistance: {MEDASSISTANCEINFO:25044}  Patient's preferred pharmacy is:  [id number] [place] [place] - [address] [place]Phone: [phone]: [phone] Uses pill box? {Yes or If no, why not?:20788} Pt endorses ***% compliance  We discussed: {Pharmacy options:24294} Patient decided to: {US Pharmacy PCBSW:96759} Care Plan and Follow Up Patient Decision:  {FOLLOWUP:24991}  Plan: {CM FOLLOW UP PFMBW:46659} MJeni Salles PharmD BDeephavenPharmacist LSullyat BLorain

## 2020-07-26 ENCOUNTER — Telehealth: Payer: HMO

## 2020-08-16 ENCOUNTER — Telehealth: Payer: Self-pay | Admitting: Family Medicine

## 2020-08-16 NOTE — Telephone Encounter (Signed)
Left message for patient to call back and schedule Medicare Annual Wellness Visit (AWV) either virtually or in office.   AWV-I PER [place]  12/23/18  please schedule at anytime with [facility] Nurse Health Advisor 1 or 2   This should be a 45 minute visit.

## 2020-08-18 ENCOUNTER — Other Ambulatory Visit: Payer: Self-pay | Admitting: Family Medicine

## 2020-08-21 NOTE — Telephone Encounter (Signed)
Last filled in 2018. Ok to send refills?

## 2020-08-22 MED ORDER — AZELASTINE HCL 0.1 % NA SOLN
NASAL | 2 refills | Status: DC
Start: 2020-08-22 — End: 2020-11-16

## 2020-09-08 ENCOUNTER — Other Ambulatory Visit: Payer: Self-pay | Admitting: Family Medicine

## 2020-09-12 DIAGNOSIS — G894 Chronic pain syndrome: Secondary | ICD-10-CM | POA: Diagnosis not present

## 2020-09-12 DIAGNOSIS — E1142 Type 2 diabetes mellitus with diabetic polyneuropathy: Secondary | ICD-10-CM | POA: Diagnosis not present

## 2020-09-12 DIAGNOSIS — M961 Postlaminectomy syndrome, not elsewhere classified: Secondary | ICD-10-CM | POA: Diagnosis not present

## 2020-09-12 DIAGNOSIS — Z79891 Long term (current) use of opiate analgesic: Secondary | ICD-10-CM | POA: Diagnosis not present

## 2020-09-22 ENCOUNTER — Other Ambulatory Visit: Payer: Self-pay | Admitting: Family Medicine

## 2020-10-02 ENCOUNTER — Other Ambulatory Visit: Payer: Self-pay

## 2020-10-02 ENCOUNTER — Ambulatory Visit (INDEPENDENT_AMBULATORY_CARE_PROVIDER_SITE_OTHER): Payer: HMO | Admitting: Family Medicine

## 2020-10-02 VITALS — BP 150/80 | HR 74 | Temp 97.9°F | Wt 275.2 lb

## 2020-10-02 DIAGNOSIS — IMO0002 Reserved for concepts with insufficient information to code with codable children: Secondary | ICD-10-CM

## 2020-10-02 DIAGNOSIS — Z1211 Encounter for screening for malignant neoplasm of colon: Secondary | ICD-10-CM

## 2020-10-02 DIAGNOSIS — E1140 Type 2 diabetes mellitus with diabetic neuropathy, unspecified: Secondary | ICD-10-CM

## 2020-10-02 DIAGNOSIS — E1165 Type 2 diabetes mellitus with hyperglycemia: Secondary | ICD-10-CM

## 2020-10-02 LAB — POCT GLYCOSYLATED HEMOGLOBIN (HGB A1C): Hemoglobin A1C: 7.7 % — AB (ref 4.0–5.6)

## 2020-10-02 NOTE — Patient Instructions (Signed)
A1C is 7.7%  Try to lose some weight  Let's plan on 3 month follow up  I am setting up colonoscopy referral.

## 2020-10-02 NOTE — Progress Notes (Signed)
Established Patient Office Visit  Subjective:  Patient ID: Michael Cassetta., male    DOB: June 21, 1950  Age: 70 y.o. MRN: [id number]  CC:  Chief Complaint  Patient presents with   Follow-up    HPI Michael B Granato Jr. presents for follow-up regarding type 2 diabetes.  Refer to previous note for details.  We started Jardiance earlier this year but he had severe case of yeast balanitis.  He took himself off and last A1c was 7.2%.  Unfortunately though he has gained 8 pounds since that visit.  Not monitoring blood sugars regularly.  His blood pressures been well controlled by home readings with most readings around 465-681 systolic.  He states that his pain management specialist was able to take him off morphine recently and he feels like he has had some increased pain this week which he thinks may be exacerbating his blood pressure.  No recent headaches or chest pains.  He has history of chronic fatty liver changes and chronic elevated liver transaminases with negative studies previously for infectious hepatitis.  Past Medical History:  Diagnosis Date   Arthritis    oa--and ra.  s/p right knee replacement on Jan 28, 2011--plans left knee replacement on 03/04/11.  also severe lower back pain-previous lumbar fusions, also pain in both hips, shoulders    Aspiration pneumonia (HCC)    Asthma    Diabetes mellitus    Gallstone pancreatitis    GERD (gastroesophageal reflux disease)    Hepatitis    pancreatitis 2010   Hyperlipidemia    Hypertension    eccho 6/10, EKG 1/11, clearence Dr Elease Hashimoto from 11/28/10 on chart   Peripheral vascular disease ([place])    states after arthroscopy left and right was given blood thinners- had symptoms of blood clot but wasnt diagnosed   Pneumonia    2010   Sleep apnea    study years ago/ no cpap/ states mild    Past Surgical History:  Procedure Laterality Date   BACK SURGERY     hx of multiple back surgeries including lumbar fusion S1-L3   BIOPSY   12/04/2018   Procedure: BIOPSY;  Surgeon: Yetta Flock, MD;  Location: [facility];  Service: Gastroenterology;;   CHOLECYSTECTOMY     COLON SURGERY     removal of part of small intestine 1979   ELBOW SURGERY     3 tendon repairs on right; 5 tendon repairs on left   ESOPHAGOGASTRODUODENOSCOPY (EGD) WITH PROPOFOL N/A 12/04/2018   Procedure: ESOPHAGOGASTRODUODENOSCOPY (EGD) WITH PROPOFOL;  Surgeon: Yetta Flock, MD;  Location: [facility];  Service: Gastroenterology;  Laterality: N/A;   SHOULDER SURGERY     left x 2, right x 3   TOTAL KNEE ARTHROPLASTY  01/28/2011   Procedure: TOTAL KNEE ARTHROPLASTY;  Surgeon: Mauri Pole;  Location: [facility];  Service: Orthopedics;  Laterality: Right;  femoral nerve block preop.   TOTAL KNEE ARTHROPLASTY  03/04/2011   Procedure: TOTAL KNEE ARTHROPLASTY;  Surgeon: Mauri Pole;  Location: [facility];  Service: Orthopedics;  Laterality: Left;    Family History  Problem Relation Age of Onset   Colon cancer Neg Hx    Esophageal cancer Neg Hx    Pancreatic cancer Neg Hx    Stomach cancer Neg Hx    Liver disease Neg Hx     Social History   Socioeconomic History   Marital status: Married    Spouse name: Not on file   Number of children: Not on  file   Years of education: Not on file   Highest education level: Not on file  Occupational History   Not on file  Tobacco Use   Smoking status: Never   Smokeless tobacco: Former    Types: Snuff    Quit date: 01/22/1991  Vaping Use   Vaping Use: Never used  Substance and Sexual Activity   Alcohol use: No   Drug use: No   Sexual activity: Not on file  Other Topics Concern   Not on file  Social History Narrative   Not on file   Social Determinants of Health   Financial Resource Strain: Not on file  Food Insecurity: Not on file  Transportation Needs: Not on file  Physical Activity: Not on file  Stress: Not on file  Social Connections: Not on file  Intimate Partner Violence: Not  on file    Outpatient Medications Prior to Visit  Medication Sig Dispense Refill   amLODipine (NORVASC) 10 MG tablet TAKE 1 TABLET BY MOUTH EVERY DAY 90 tablet 1   atorvastatin (LIPITOR) 20 MG tablet Take 1 tablet (20 mg total) by mouth daily. 90 tablet 3   azelastine (ASTELIN) 0.1 % nasal spray Use 2 sprays in each nostril, 2 times a day for allergies. 30 mL 2   B-D UF III MINI PEN NEEDLES 31G X 5 MM MISC USE TWICE A DAY WITH INSULIN PEN 100 each 0   Blood Glucose Monitoring Suppl (ONE TOUCH ULTRA 2) w/Device KIT Use as directed to check blood glucose two times daily. Dx Code E11.65 1 kit 1   docusate sodium (COLACE) 100 MG capsule Take 100 mg by mouth 3 (three) times daily as needed. Constipation      fexofenadine-pseudoephedrine (ALLEGRA-D 12 HOUR) 60-120 MG per tablet Take 1 tablet by mouth 2 (two) times daily. 60 tablet 6   glucose blood (ONETOUCH ULTRA) test strip Use as instructed to test blood glucose two times daily. Dx Code E11.65 200 each 3   insulin lispro (HUMALOG) 100 UNIT/ML injection Inject into the skin 3 (three) times daily before meals. Take three times dailhy prior to meals as per sliding scale.     Insulin Pen Needle 31G X 5 MM MISC Use as directed with Basaglar Kwikpen 100 each 1   Lancets (ONETOUCH ULTRASOFT) lancets Use as instructed to check blood glucose two times daily. Dx Code E11.65 200 each 3   LANTUS SOLOSTAR 100 UNIT/ML Solostar Pen INJECT 60 UNITS INTO THE SKIN TWICE DAILY 120 mL 2   lisinopril (ZESTRIL) 40 MG tablet TAKE 1 TABLET BY MOUTH EVERY DAY 90 tablet 1   methocarbamol (ROBAXIN) 500 MG tablet Take 500 mg by mouth every 8 (eight) hours as needed.     metoprolol succinate (TOPROL-XL) 100 MG 24 hr tablet TAKE 1 TABLET BY MOUTH EVERY MORNING 90 tablet 2   pantoprazole (PROTONIX) 40 MG tablet TAKE 1 TABLET BY MOUTH EVERY DAY 90 tablet 1   polyethylene glycol (MIRALAX / GLYCOLAX) packet Take 17 g by mouth daily as needed. Constipation      JARDIANCE 10 MG  TABS tablet TAKE 1 TABLET BY MOUTH DAILY BEFORE BREAKFAST. 30 tablet 5   morphine (MS CONTIN) 30 MG 12 hr tablet Take 30 mg by mouth every 12 (twelve) hours.   0   metoCLOPramide (REGLAN) 5 MG tablet Take 1 tablet (5 mg total) by mouth 3 (three) times daily before meals. 90 tablet 1   No facility-administered medications prior to visit.  Allergies  Allergen Reactions   Cortisone Itching    Hives and causes blood sugar to elevate   Jardiance [Empagliflozin] Other (See Comments)    Yeast balanitis    ROS Review of Systems  Constitutional:  Negative for fatigue.  Eyes:  Negative for visual disturbance.  Respiratory:  Negative for cough, chest tightness and shortness of breath.   Cardiovascular:  Negative for chest pain, palpitations and leg swelling.  Endocrine: Negative for polydipsia and polyuria.  Musculoskeletal:  Positive for arthralgias and back pain.  Neurological:  Negative for dizziness, syncope, weakness, light-headedness and headaches.     Objective:    Physical Exam Vitals reviewed.  Cardiovascular:     Rate and Rhythm: Normal rate and regular rhythm.  Pulmonary:     Effort: Pulmonary effort is normal.     Breath sounds: Normal breath sounds.  Musculoskeletal:     Comments: Trace edema lower legs bilaterally  Skin:    Comments: Feet are warm to touch.  He has some mild dryness.  No calluses.  No lesions.  He has dystrophic and probably onychomycosis involving the toenails.  Normal sensory function with monofilament throughout  Neurological:     General: No focal deficit present.    BP (!) 150/80 (BP Location: Left Arm, Patient Position: Sitting, Cuff Size: Normal)   Pulse 74   Temp 97.9 F (36.6 C) (Oral)   Wt 275 lb 3.2 oz (124.8 kg)   SpO2 98%   BMI 33.50 kg/m  Wt Readings from Last 3 Encounters:  10/02/20 275 lb 3.2 oz (124.8 kg)  07/02/20 267 lb 3.2 oz (121.2 kg)  02/03/20 252 lb 9.6 oz (114.6 kg)     Health Maintenance Due  Topic Date Due    Zoster Vaccines- Shingrix (1 of 2) Never done   COLONOSCOPY (Pts 45-22yr Insurance coverage will need to be confirmed)  Never done   OPHTHALMOLOGY EXAM  04/23/2017   PNA vac Low Risk Adult (2 of 2 - PPSV23) 05/07/2020   COVID-19 Vaccine (4 - Booster for Pfizer series) 06/08/2020   FOOT EXAM  07/19/2020    There are no preventive care reminders to display for this patient.  Lab Results  Component Value Date   TSH 4.64 05/30/2010   Lab Results  Component Value Date   WBC 13.4 (H) 12/04/2018   HGB 12.6 (L) 12/04/2018   HCT 36.2 (L) 12/04/2018   MCV 86.4 12/04/2018   PLT 404 (H) 12/04/2018   Lab Results  Component Value Date   NA 136 04/20/2019   K 4.5 04/20/2019   CO2 27 04/20/2019   GLUCOSE 424 (H) 04/20/2019   BUN 17 04/20/2019   CREATININE 1.06 04/20/2019   BILITOT 0.9 02/03/2020   ALKPHOS 121 (H) 02/03/2020   AST 120 (H) 02/03/2020   ALT 125 (H) 02/03/2020   PROT 6.4 02/03/2020   ALBUMIN 4.2 02/03/2020   CALCIUM 9.1 04/20/2019   ANIONGAP 14 12/05/2018   GFR 69.84 04/20/2019   Lab Results  Component Value Date   CHOL 117 02/03/2020   Lab Results  Component Value Date   HDL 52.50 02/03/2020   Lab Results  Component Value Date   LDLCALC 36 02/03/2020   Lab Results  Component Value Date   TRIG 143.0 02/03/2020   Lab Results  Component Value Date   CHOLHDL 2 02/03/2020   Lab Results  Component Value Date   HGBA1C 7.2 (A) 07/02/2020      Assessment & Plan:   #1  type 2 diabetes with worsening control with A1c today 7.7% probably reflecting his recent weight gain.  He has been less active physically.  We discussed options.  Could increase his insulin but he would like to give 3 months of lifestyle modification and losing some weight as A1c recently was 7.2% when his weight was down to 267 pounds.  #2 health maintenance.  Patient has never had colonoscopy.  He was scheduled once before but apparently colonoscopy was canceled and he never rescheduled.  He  does agree to letting us set this up at this time.  -Set up 85-monthfollow-up and will plan to check fasting labs that time including comprehensive metabolic panel, lipid panel, CBC   No orders of the defined types were placed in this encounter.   Follow-up: No follow-ups on file.    BCarolann Littler MD

## 2020-10-10 ENCOUNTER — Telehealth: Payer: Self-pay | Admitting: Family Medicine

## 2020-10-10 NOTE — Telephone Encounter (Signed)
Left message for patient to call back and schedule Medicare Annual Wellness Visit (AWV) either virtually or in office.   AWV-I PER [place]  12/23/18  please schedule at anytime with [facility] Nurse Health Advisor 1 or 2   This should be a 45 minute visit. 

## 2020-10-31 ENCOUNTER — Telehealth: Payer: Self-pay | Admitting: Family Medicine

## 2020-10-31 NOTE — Chronic Care Management (AMB) (Signed)
  Chronic Care Management   Outreach Note  10/31/2020 Name: [name]. MRN: [id number] DOB: 1950/04/26  Referred by: Kristian Covey, MD Reason for referral : No chief complaint on file.   An unsuccessful telephone outreach was attempted today. The patient was referred to the pharmacist for assistance with care management and care coordination.   Follow Up Plan: left vm to r/s missed apt  Tatjana Dellinger Upstream Scheduler

## 2020-11-07 ENCOUNTER — Telehealth: Payer: Self-pay

## 2020-11-07 ENCOUNTER — Other Ambulatory Visit: Payer: Self-pay

## 2020-11-07 DIAGNOSIS — G894 Chronic pain syndrome: Secondary | ICD-10-CM | POA: Diagnosis not present

## 2020-11-07 DIAGNOSIS — E1142 Type 2 diabetes mellitus with diabetic polyneuropathy: Secondary | ICD-10-CM | POA: Diagnosis not present

## 2020-11-07 DIAGNOSIS — Z79891 Long term (current) use of opiate analgesic: Secondary | ICD-10-CM | POA: Diagnosis not present

## 2020-11-07 DIAGNOSIS — M961 Postlaminectomy syndrome, not elsewhere classified: Secondary | ICD-10-CM | POA: Diagnosis not present

## 2020-11-07 MED ORDER — LANTUS SOLOSTAR 100 UNIT/ML ~~LOC~~ SOPN: PEN_INJECTOR | SUBCUTANEOUS | 1 refills | Status: DC

## 2020-11-07 NOTE — Telephone Encounter (Signed)
Patient's wife called requesting a refill on LANTUS SOLOSTAR 100 UNIT/ML Solostar Pen

## 2020-11-07 NOTE — Telephone Encounter (Signed)
Refill has been sent to [facility] [address]

## 2020-11-08 ENCOUNTER — Telehealth: Payer: Self-pay | Admitting: Family Medicine

## 2020-11-08 NOTE — Telephone Encounter (Signed)
Left message for patient to call back and schedule Medicare Annual Wellness Visit (AWV) either virtually or in office. Left  my jabber number [phone]    AWV-I PER [place]  12/23/18 ; please schedule at anytime with [facility] Nurse Health Advisor 1 or 2   This should be a 45 minute visit.  

## 2020-11-13 ENCOUNTER — Other Ambulatory Visit: Payer: Self-pay | Admitting: Family Medicine

## 2020-11-16 ENCOUNTER — Other Ambulatory Visit: Payer: Self-pay | Admitting: Family Medicine

## 2020-12-24 ENCOUNTER — Telehealth: Payer: Self-pay | Admitting: Pharmacist

## 2020-12-24 NOTE — Chronic Care Management (AMB) (Signed)
Chronic Care Management Pharmacy Assistant   Name: [name].  MRN: [id number] DOB: [birth year]    Reason for Encounter: Chart review for initial visit with [name] Clinical Pharmacist on 12-26-2020 at 3:30 via telephone.   Conditions to be addressed/monitored: HTN, HLD, DMII, and Anemia  Recent office visits:  10-02-2020 [name], MD - Patient presented for Type 2 diabetes, uncontrolled, with neuropathy and other concerns. Stopped Empagliflozin.  07-02-2020 Burchette, Alinda Sierras, MD - Patient presented for Type 2 diabetes, uncontrolled, with neuropathy and other concerns. No medication changes.  Recent consult visits:  None  Hospital visits:  None in previous 6 months  Medications: Outpatient Encounter Medications as of 12/24/2020  Medication Sig   amLODipine (NORVASC) 10 MG tablet TAKE 1 TABLET BY MOUTH EVERY DAY   atorvastatin (LIPITOR) 20 MG tablet Take 1 tablet (20 mg total) by mouth daily.   Azelastine HCl 137 MCG/SPRAY SOLN USE 2 SPRAYS IN EACH NOSTRIL TWICE DAILY FOR ALLERGIES   B-D UF III MINI PEN NEEDLES 31G X 5 MM MISC USE TWICE A DAY WITH INSULIN PEN   Blood Glucose Monitoring Suppl (ONE TOUCH ULTRA 2) w/Device KIT Use as directed to check blood glucose two times daily. Dx Code E11.65   docusate sodium (COLACE) 100 MG capsule Take 100 mg by mouth 3 (three) times daily as needed. Constipation    fexofenadine-pseudoephedrine (ALLEGRA-D 12 HOUR) 60-120 MG per tablet Take 1 tablet by mouth 2 (two) times daily.   glucose blood (ONETOUCH ULTRA) test strip Use as instructed to test blood glucose two times daily. Dx Code E11.65   insulin glargine (LANTUS SOLOSTAR) 100 UNIT/ML Solostar Pen INJECT 60 UNITS INTO THE SKIN TWICE DAILY   insulin lispro (HUMALOG) 100 UNIT/ML injection Inject into the skin 3 (three) times daily before meals. Take three times dailhy prior to meals as per sliding scale.   Insulin Pen Needle 31G X 5 MM MISC Use as directed with  Basaglar Kwikpen   Lancets (ONETOUCH ULTRASOFT) lancets Use as instructed to check blood glucose two times daily. Dx Code E11.65   lisinopril (ZESTRIL) 40 MG tablet TAKE 1 TABLET BY MOUTH EVERY DAY   methocarbamol (ROBAXIN) 500 MG tablet Take 500 mg by mouth every 8 (eight) hours as needed.   metoprolol succinate (TOPROL-XL) 100 MG 24 hr tablet TAKE 1 TABLET BY MOUTH EVERY MORNING   pantoprazole (PROTONIX) 40 MG tablet TAKE 1 TABLET BY MOUTH EVERY DAY   polyethylene glycol (MIRALAX / GLYCOLAX) packet Take 17 g by mouth daily as needed. Constipation    No facility-administered encounter medications on file as of 12/24/2020.  Fill History :  AMLODIPINE BESYLATE 10 MG TAB 12/12/2020 90 atorvastatin 20 mg tablet 07/18/2020   AZELASTINE 0.1% (137 MCG) SPRY 08/22/2020 90   LANTUS SOLOSTAR 100 UNIT/ML 11/15/2020 90   LISINOPRIL 40 MG TABLET 09/25/2020 90   METHOCARBAMOL 500 MG TABLET 12/09/2020 30   METOPROLOL SUCC ER 100 MG TAB 10/07/2020 90   PANTOPRAZOLE SODIUM 40MG TABLET DELAYED RELEASE 08/15/2019 90    ONETOUCH ULTRA2 GLUCOSE SYST 04/22/2019 1   ONETOUCH ULTRA TEST STRIP 12/27/2019 90   BD UF MINI PEN NEEDLE 5MMX31G 02/14/2020 50     Unable to reach patient to complete initial questions on 10-3 & 10-4  Care Gaps: A1C - 7.7 BP- 150-80 Foot Exam -  Zoster Vaccines - Overdue Colonoscopy - Overdue Eye Exam - Overdue COVID Booster #4 Pfizer - Overdue Flu Vaccine - Overdue AWV -  office aware of need to schedule  Star Rating Drugs: Lisinopril (Zestril ) 40 mg - Last [facility]-25-2020 [facility] Atorvastatin (Lipitor) 20 mg - [facility]  [facility] to [place] she advised  Atorvastatin (Lipitor) 20 mg - [facility]  Notes: 12-25-2020 Attempted to reach patient again to complete IQ's no ans left another msg on machine.  [place] Clinical Pharmacist Assistant [phone]

## 2020-12-26 ENCOUNTER — Telehealth: Payer: HMO

## 2020-12-26 ENCOUNTER — Telehealth: Payer: Self-pay | Admitting: Pharmacist

## 2020-12-26 NOTE — Progress Notes (Deleted)
 Chronic Care Management Pharmacy Note  12/26/2020 Name:  Michael B Fuerst Jr. MRN:  [id number] DOB:  12/17/1950  Summary: ***  Recommendations/Changes made from today's visit: ***  Plan: ***   Subjective: Michael B Luce Jr. is an 70 y.o. year old male who is a primary patient of Burchette, Bruce W, MD.  The CCM team was consulted for assistance with disease management and care coordination needs.    Engaged with patient by telephone for initial visit in response to provider referral for pharmacy case management and/or care coordination services.   Consent to Services:  The patient was given the following information about Chronic Care Management services today, agreed to services, and gave verbal consent: 1. CCM service includes personalized support from designated clinical staff supervised by the primary care provider, including individualized plan of care and coordination with other care providers 2. 24/7 contact phone numbers for assistance for urgent and routine care needs. 3. Service will only be billed when office clinical staff spend 20 minutes or more in a month to coordinate care. 4. Only one practitioner may furnish and bill the service in a calendar month. 5.The patient may stop CCM services at any time (effective at the end of the month) by phone call to the office staff. 6. The patient will be responsible for cost sharing (co-pay) of up to 20% of the service fee (after annual deductible is met). Patient agreed to services and consent obtained.  Patient Care Team: Burchette, Bruce W, MD as PCP - General , Madeline G, RPH as Pharmacist (Pharmacist)  Recent office visits: 10-02-2020 Burchette, Bruce W, MD - Patient presented for Type 2 diabetes, uncontrolled, with neuropathy and other concerns. Stopped Empagliflozin.   07-02-2020 Burchette, Bruce W, MD - Patient presented for Type 2 diabetes, uncontrolled, with neuropathy and other concerns. No medication  changes.  Recent consult visits: None  Hospital visits: None in previous 6 months   Objective:  Lab Results  Component Value Date   CREATININE 1.06 04/20/2019   BUN 17 04/20/2019   GFR 69.84 04/20/2019   GFRNONAA >60 12/05/2018   GFRAA >60 12/05/2018   NA 136 04/20/2019   K 4.5 04/20/2019   CALCIUM 9.1 04/20/2019   CO2 27 04/20/2019   GLUCOSE 424 (H) 04/20/2019    Lab Results  Component Value Date/Time   HGBA1C 7.7 (A) 10/02/2020 08:33 AM   HGBA1C 7.2 (A) 07/02/2020 08:14 AM   HGBA1C 7.8 02/03/2020 07:40 AM   HGBA1C 7.8 (A) 02/03/2020 07:40 AM   HGBA1C 7.8 (A) 02/03/2020 07:40 AM   HGBA1C 9.2 (H) 12/04/2018 01:04 AM   HGBA1C 9.4 (H) 12/03/2018 01:24 PM   GFR 69.84 04/20/2019 07:54 AM   GFR 76.17 01/27/2018 07:42 AM   MICROALBUR 0.4 06/14/2009 08:27 AM    Last diabetic Eye exam:  Lab Results  Component Value Date/Time   HMDIABEYEEXA No Retinopathy 04/23/2016 12:00 AM    Last diabetic Foot exam:  Lab Results  Component Value Date/Time   HMDIABFOOTEX normal 07/06/2014 12:00 AM     Lab Results  Component Value Date   CHOL 117 02/03/2020   HDL 52.50 02/03/2020   LDLCALC 36 02/03/2020   LDLDIRECT 102.0 01/27/2018   TRIG 143.0 02/03/2020   CHOLHDL 2 02/03/2020    Hepatic Function Latest Ref Rng & Units 02/03/2020 04/20/2019 03/11/2019  Total Protein 6.0 - 8.3 g/dL 6.4 6.5 6.6  Albumin 3.5 - 5.2 g/dL 4.2 4.3 4.2  AST 0 - 37 U/L 120(H) 29 52(H)    Chronic Care Management Pharmacy Note  12/26/2020 Name:  Michael Liou. MRN:  [id number] DOB:  [birth year]  Summary: ***  Recommendations/Changes made from today's visit: ***  Plan: ***   Subjective: Michael Riser. is an 70 y.o. year old male who is a primary patient of Burchette, Alinda Sierras, MD.  The CCM team was consulted for assistance with disease management and care coordination needs.    Engaged with patient by telephone for initial visit in response to provider referral for pharmacy case management and/or care coordination services.   Consent to Services:  The patient was given the following information about Chronic Care Management services today, agreed to services, and gave verbal consent: 1. CCM service includes personalized support from designated clinical staff supervised by the primary care provider, including individualized plan of care and coordination with other care providers 2. 24/7 contact phone numbers for assistance for urgent and routine care needs. 3. Service will only be billed when office clinical staff spend 20 minutes or more in a month to coordinate care. 4. Only one practitioner may furnish and bill the service in a calendar month. 5.The patient may stop CCM services at any time (effective at the end of the month) by phone call to the office staff. 6. The patient will be responsible for cost sharing (co-pay) of up to 20% of the service fee (after annual deductible is met). Patient agreed to services and consent obtained.  Patient Care Team: [name], MD as PCP - General Viona Gilmore, [facility] as Pharmacist (Pharmacist)  Recent office visits: 10-02-2020 [name], MD - Patient presented for Type 2 diabetes, uncontrolled, with neuropathy and other concerns. Stopped Empagliflozin.   07-02-2020 Burchette, Alinda Sierras, MD - Patient presented for Type 2 diabetes, uncontrolled, with neuropathy and other concerns. No medication  changes.  Recent consult visits: None  Hospital visits: None in previous 6 months   Objective:  Lab Results  Component Value Date   CREATININE 1.06 04/20/2019   BUN 17 04/20/2019   GFR 69.84 04/20/2019   GFRNONAA >60 12/05/2018   GFRAA >60 12/05/2018   NA 136 04/20/2019   K 4.5 04/20/2019   CALCIUM 9.1 04/20/2019   CO2 27 04/20/2019   GLUCOSE 424 (H) 04/20/2019    Lab Results  Component Value Date/Time   HGBA1C 7.7 (A) 10/02/2020 08:33 AM   HGBA1C 7.2 (A) 07/02/2020 08:14 AM   HGBA1C 7.8 02/03/2020 07:40 AM   HGBA1C 7.8 (A) 02/03/2020 07:40 AM   HGBA1C 7.8 (A) 02/03/2020 07:40 AM   HGBA1C 9.2 (H) 12/04/2018 01:04 AM   HGBA1C 9.4 (H) 12/03/2018 01:24 PM   GFR 69.84 04/20/2019 07:54 AM   GFR 76.17 01/27/2018 07:42 AM   MICROALBUR 0.4 06/14/2009 08:27 AM    Last diabetic Eye exam:  Lab Results  Component Value Date/Time   HMDIABEYEEXA No Retinopathy 04/23/2016 12:00 AM    Last diabetic Foot exam:  Lab Results  Component Value Date/Time   HMDIABFOOTEX normal 07/06/2014 12:00 AM     Lab Results  Component Value Date   CHOL 117 02/03/2020   HDL 52.50 02/03/2020   LDLCALC 36 02/03/2020   LDLDIRECT 102.0 01/27/2018   TRIG 143.0 02/03/2020   CHOLHDL 2 02/03/2020    Hepatic Function Latest Ref Rng & Units 02/03/2020 04/20/2019 03/11/2019  Total Protein 6.0 - 8.3 g/dL 6.4 6.5 6.6  Albumin 3.5 - 5.2 g/dL 4.2 4.3 4.2  AST 0 - 37 U/L 120(H) 29 52(H)   Chronic Care Management Pharmacy Note  12/26/2020 Name:  Michael B Fuerst Jr. MRN:  [id number] DOB:  12/17/1950  Summary: ***  Recommendations/Changes made from today's visit: ***  Plan: ***   Subjective: Michael B Luce Jr. is an 70 y.o. year old male who is a primary patient of Burchette, Bruce W, MD.  The CCM team was consulted for assistance with disease management and care coordination needs.    Engaged with patient by telephone for initial visit in response to provider referral for pharmacy case management and/or care coordination services.   Consent to Services:  The patient was given the following information about Chronic Care Management services today, agreed to services, and gave verbal consent: 1. CCM service includes personalized support from designated clinical staff supervised by the primary care provider, including individualized plan of care and coordination with other care providers 2. 24/7 contact phone numbers for assistance for urgent and routine care needs. 3. Service will only be billed when office clinical staff spend 20 minutes or more in a month to coordinate care. 4. Only one practitioner may furnish and bill the service in a calendar month. 5.The patient may stop CCM services at any time (effective at the end of the month) by phone call to the office staff. 6. The patient will be responsible for cost sharing (co-pay) of up to 20% of the service fee (after annual deductible is met). Patient agreed to services and consent obtained.  Patient Care Team: Burchette, Bruce W, MD as PCP - General , Madeline G, RPH as Pharmacist (Pharmacist)  Recent office visits: 10-02-2020 Burchette, Bruce W, MD - Patient presented for Type 2 diabetes, uncontrolled, with neuropathy and other concerns. Stopped Empagliflozin.   07-02-2020 Burchette, Bruce W, MD - Patient presented for Type 2 diabetes, uncontrolled, with neuropathy and other concerns. No medication  changes.  Recent consult visits: None  Hospital visits: None in previous 6 months   Objective:  Lab Results  Component Value Date   CREATININE 1.06 04/20/2019   BUN 17 04/20/2019   GFR 69.84 04/20/2019   GFRNONAA >60 12/05/2018   GFRAA >60 12/05/2018   NA 136 04/20/2019   K 4.5 04/20/2019   CALCIUM 9.1 04/20/2019   CO2 27 04/20/2019   GLUCOSE 424 (H) 04/20/2019    Lab Results  Component Value Date/Time   HGBA1C 7.7 (A) 10/02/2020 08:33 AM   HGBA1C 7.2 (A) 07/02/2020 08:14 AM   HGBA1C 7.8 02/03/2020 07:40 AM   HGBA1C 7.8 (A) 02/03/2020 07:40 AM   HGBA1C 7.8 (A) 02/03/2020 07:40 AM   HGBA1C 9.2 (H) 12/04/2018 01:04 AM   HGBA1C 9.4 (H) 12/03/2018 01:24 PM   GFR 69.84 04/20/2019 07:54 AM   GFR 76.17 01/27/2018 07:42 AM   MICROALBUR 0.4 06/14/2009 08:27 AM    Last diabetic Eye exam:  Lab Results  Component Value Date/Time   HMDIABEYEEXA No Retinopathy 04/23/2016 12:00 AM    Last diabetic Foot exam:  Lab Results  Component Value Date/Time   HMDIABFOOTEX normal 07/06/2014 12:00 AM     Lab Results  Component Value Date   CHOL 117 02/03/2020   HDL 52.50 02/03/2020   LDLCALC 36 02/03/2020   LDLDIRECT 102.0 01/27/2018   TRIG 143.0 02/03/2020   CHOLHDL 2 02/03/2020    Hepatic Function Latest Ref Rng & Units 02/03/2020 04/20/2019 03/11/2019  Total Protein 6.0 - 8.3 g/dL 6.4 6.5 6.6  Albumin 3.5 - 5.2 g/dL 4.2 4.3 4.2  AST 0 - 37 U/L 120(H) 29 52(H)     Chronic Care Management Pharmacy Note  12/26/2020 Name:  Michael B Fuerst Jr. MRN:  [id number] DOB:  12/17/1950  Summary: ***  Recommendations/Changes made from today's visit: ***  Plan: ***   Subjective: Michael B Luce Jr. is an 70 y.o. year old male who is a primary patient of Burchette, Bruce W, MD.  The CCM team was consulted for assistance with disease management and care coordination needs.    Engaged with patient by telephone for initial visit in response to provider referral for pharmacy case management and/or care coordination services.   Consent to Services:  The patient was given the following information about Chronic Care Management services today, agreed to services, and gave verbal consent: 1. CCM service includes personalized support from designated clinical staff supervised by the primary care provider, including individualized plan of care and coordination with other care providers 2. 24/7 contact phone numbers for assistance for urgent and routine care needs. 3. Service will only be billed when office clinical staff spend 20 minutes or more in a month to coordinate care. 4. Only one practitioner may furnish and bill the service in a calendar month. 5.The patient may stop CCM services at any time (effective at the end of the month) by phone call to the office staff. 6. The patient will be responsible for cost sharing (co-pay) of up to 20% of the service fee (after annual deductible is met). Patient agreed to services and consent obtained.  Patient Care Team: Burchette, Bruce W, MD as PCP - General , Madeline G, RPH as Pharmacist (Pharmacist)  Recent office visits: 10-02-2020 Burchette, Bruce W, MD - Patient presented for Type 2 diabetes, uncontrolled, with neuropathy and other concerns. Stopped Empagliflozin.   07-02-2020 Burchette, Bruce W, MD - Patient presented for Type 2 diabetes, uncontrolled, with neuropathy and other concerns. No medication  changes.  Recent consult visits: None  Hospital visits: None in previous 6 months   Objective:  Lab Results  Component Value Date   CREATININE 1.06 04/20/2019   BUN 17 04/20/2019   GFR 69.84 04/20/2019   GFRNONAA >60 12/05/2018   GFRAA >60 12/05/2018   NA 136 04/20/2019   K 4.5 04/20/2019   CALCIUM 9.1 04/20/2019   CO2 27 04/20/2019   GLUCOSE 424 (H) 04/20/2019    Lab Results  Component Value Date/Time   HGBA1C 7.7 (A) 10/02/2020 08:33 AM   HGBA1C 7.2 (A) 07/02/2020 08:14 AM   HGBA1C 7.8 02/03/2020 07:40 AM   HGBA1C 7.8 (A) 02/03/2020 07:40 AM   HGBA1C 7.8 (A) 02/03/2020 07:40 AM   HGBA1C 9.2 (H) 12/04/2018 01:04 AM   HGBA1C 9.4 (H) 12/03/2018 01:24 PM   GFR 69.84 04/20/2019 07:54 AM   GFR 76.17 01/27/2018 07:42 AM   MICROALBUR 0.4 06/14/2009 08:27 AM    Last diabetic Eye exam:  Lab Results  Component Value Date/Time   HMDIABEYEEXA No Retinopathy 04/23/2016 12:00 AM    Last diabetic Foot exam:  Lab Results  Component Value Date/Time   HMDIABFOOTEX normal 07/06/2014 12:00 AM     Lab Results  Component Value Date   CHOL 117 02/03/2020   HDL 52.50 02/03/2020   LDLCALC 36 02/03/2020   LDLDIRECT 102.0 01/27/2018   TRIG 143.0 02/03/2020   CHOLHDL 2 02/03/2020    Hepatic Function Latest Ref Rng & Units 02/03/2020 04/20/2019 03/11/2019  Total Protein 6.0 - 8.3 g/dL 6.4 6.5 6.6  Albumin 3.5 - 5.2 g/dL 4.2 4.3 4.2  AST 0 - 37 U/L 120(H) 29 52(H)    Chronic Care Management Pharmacy Note  12/26/2020 Name:  Michael Liou. MRN:  [id number] DOB:  [birth year]  Summary: ***  Recommendations/Changes made from today's visit: ***  Plan: ***   Subjective: Michael Riser. is an 70 y.o. year old male who is a primary patient of Burchette, Alinda Sierras, MD.  The CCM team was consulted for assistance with disease management and care coordination needs.    Engaged with patient by telephone for initial visit in response to provider referral for pharmacy case management and/or care coordination services.   Consent to Services:  The patient was given the following information about Chronic Care Management services today, agreed to services, and gave verbal consent: 1. CCM service includes personalized support from designated clinical staff supervised by the primary care provider, including individualized plan of care and coordination with other care providers 2. 24/7 contact phone numbers for assistance for urgent and routine care needs. 3. Service will only be billed when office clinical staff spend 20 minutes or more in a month to coordinate care. 4. Only one practitioner may furnish and bill the service in a calendar month. 5.The patient may stop CCM services at any time (effective at the end of the month) by phone call to the office staff. 6. The patient will be responsible for cost sharing (co-pay) of up to 20% of the service fee (after annual deductible is met). Patient agreed to services and consent obtained.  Patient Care Team: [name], MD as PCP - General Viona Gilmore, [facility] as Pharmacist (Pharmacist)  Recent office visits: 10-02-2020 [name], MD - Patient presented for Type 2 diabetes, uncontrolled, with neuropathy and other concerns. Stopped Empagliflozin.   07-02-2020 Burchette, Alinda Sierras, MD - Patient presented for Type 2 diabetes, uncontrolled, with neuropathy and other concerns. No medication  changes.  Recent consult visits: None  Hospital visits: None in previous 6 months   Objective:  Lab Results  Component Value Date   CREATININE 1.06 04/20/2019   BUN 17 04/20/2019   GFR 69.84 04/20/2019   GFRNONAA >60 12/05/2018   GFRAA >60 12/05/2018   NA 136 04/20/2019   K 4.5 04/20/2019   CALCIUM 9.1 04/20/2019   CO2 27 04/20/2019   GLUCOSE 424 (H) 04/20/2019    Lab Results  Component Value Date/Time   HGBA1C 7.7 (A) 10/02/2020 08:33 AM   HGBA1C 7.2 (A) 07/02/2020 08:14 AM   HGBA1C 7.8 02/03/2020 07:40 AM   HGBA1C 7.8 (A) 02/03/2020 07:40 AM   HGBA1C 7.8 (A) 02/03/2020 07:40 AM   HGBA1C 9.2 (H) 12/04/2018 01:04 AM   HGBA1C 9.4 (H) 12/03/2018 01:24 PM   GFR 69.84 04/20/2019 07:54 AM   GFR 76.17 01/27/2018 07:42 AM   MICROALBUR 0.4 06/14/2009 08:27 AM    Last diabetic Eye exam:  Lab Results  Component Value Date/Time   HMDIABEYEEXA No Retinopathy 04/23/2016 12:00 AM    Last diabetic Foot exam:  Lab Results  Component Value Date/Time   HMDIABFOOTEX normal 07/06/2014 12:00 AM     Lab Results  Component Value Date   CHOL 117 02/03/2020   HDL 52.50 02/03/2020   LDLCALC 36 02/03/2020   LDLDIRECT 102.0 01/27/2018   TRIG 143.0 02/03/2020   CHOLHDL 2 02/03/2020    Hepatic Function Latest Ref Rng & Units 02/03/2020 04/20/2019 03/11/2019  Total Protein 6.0 - 8.3 g/dL 6.4 6.5 6.6  Albumin 3.5 - 5.2 g/dL 4.2 4.3 4.2  AST 0 - 37 U/L 120(H) 29 52(H)

## 2020-12-26 NOTE — Telephone Encounter (Signed)
  Chronic Care Management   Outreach Note  12/26/2020 Name: [name]. MRN: [id number] DOB: [birth year]  Referred by: Kristian Covey, MD  Patient had a phone appointment scheduled with clinical pharmacist today.  An unsuccessful telephone outreach was attempted today. The patient was referred to the pharmacist for assistance with care management and care coordination.   If possible, a message was left to return call to: [phone] or to [facility]: [phone]   Gaylord Shih, PharmD, [facility] Clinical Pharmacist [facility] [place] [phone]

## 2021-01-01 ENCOUNTER — Other Ambulatory Visit: Payer: Self-pay | Admitting: Family Medicine

## 2021-01-02 ENCOUNTER — Ambulatory Visit: Payer: HMO | Admitting: Family Medicine

## 2021-01-07 DIAGNOSIS — M961 Postlaminectomy syndrome, not elsewhere classified: Secondary | ICD-10-CM | POA: Diagnosis not present

## 2021-01-07 DIAGNOSIS — E1142 Type 2 diabetes mellitus with diabetic polyneuropathy: Secondary | ICD-10-CM | POA: Diagnosis not present

## 2021-01-07 DIAGNOSIS — Z79891 Long term (current) use of opiate analgesic: Secondary | ICD-10-CM | POA: Diagnosis not present

## 2021-01-07 DIAGNOSIS — G894 Chronic pain syndrome: Secondary | ICD-10-CM | POA: Diagnosis not present

## 2021-01-09 ENCOUNTER — Telehealth: Payer: Self-pay

## 2021-01-09 NOTE — Telephone Encounter (Signed)
LVM instructions for pt to call back to reschedule 15mo f/u with PCP.

## 2021-02-01 ENCOUNTER — Ambulatory Visit: Payer: HMO | Admitting: Family Medicine

## 2021-02-22 ENCOUNTER — Telehealth: Payer: Self-pay | Admitting: Family Medicine

## 2021-02-22 MED ORDER — INSULIN LISPRO 100 UNIT/ML ~~LOC~~ SOLN
100.0000 [IU] | Freq: Three times a day (TID) | SUBCUTANEOUS | 2 refills | Status: DC
Start: 2021-02-22 — End: 2021-02-25

## 2021-02-22 NOTE — Telephone Encounter (Signed)
Pt wife is calling and her husband has an appt on 03-01-2021 and would like a refill on humalog quick pain send to  [id number] - , [place] - [address] AT [facility] [address] [facility] [address] Phone:  [phone]  Fax:  [phone]

## 2021-02-22 NOTE — Telephone Encounter (Signed)
Rx sent 

## 2021-02-23 ENCOUNTER — Other Ambulatory Visit: Payer: Self-pay | Admitting: Family Medicine

## 2021-02-25 ENCOUNTER — Telehealth: Payer: Self-pay

## 2021-02-25 NOTE — Telephone Encounter (Signed)
Wife of patient called asking that NovoLog be sent to the pharmacy instead of Humalog because insurance will not cover.

## 2021-02-25 NOTE — Telephone Encounter (Signed)
Lvm medication Novolog has been sent to pharmacy

## 2021-02-25 NOTE — Telephone Encounter (Signed)
Please advise if okay for Novolog to be sent to pharmacy.

## 2021-03-01 ENCOUNTER — Ambulatory Visit (INDEPENDENT_AMBULATORY_CARE_PROVIDER_SITE_OTHER): Payer: HMO | Admitting: Family Medicine

## 2021-03-01 VITALS — BP 140/80 | HR 75 | Temp 98.2°F | Wt 282.6 lb

## 2021-03-01 DIAGNOSIS — E119 Type 2 diabetes mellitus without complications: Secondary | ICD-10-CM | POA: Diagnosis not present

## 2021-03-01 DIAGNOSIS — E785 Hyperlipidemia, unspecified: Secondary | ICD-10-CM

## 2021-03-01 DIAGNOSIS — I10 Essential (primary) hypertension: Secondary | ICD-10-CM

## 2021-03-01 DIAGNOSIS — Z23 Encounter for immunization: Secondary | ICD-10-CM

## 2021-03-01 LAB — BASIC METABOLIC PANEL
BUN: 13 mg/dL (ref 6–23)
CO2: 27 mEq/L (ref 19–32)
Calcium: 9.2 mg/dL (ref 8.4–10.5)
Chloride: 103 mEq/L (ref 96–112)
Creatinine, Ser: 1.07 mg/dL (ref 0.40–1.50)
GFR: 71.49 mL/min (ref >60.00)
Glucose, Bld: 240 mg/dL — ABNORMAL HIGH (ref 70–99)
Potassium: 3.9 mEq/L (ref 3.5–5.1)
Sodium: 139 mEq/L (ref 135–145)

## 2021-03-01 LAB — HEPATIC FUNCTION PANEL
ALT: 18 U/L (ref 0–53)
AST: 18 U/L (ref 0–37)
Albumin: 4.4 g/dL (ref 3.5–5.2)
Alkaline Phosphatase: 70 U/L (ref 39–117)
Bilirubin, Direct: 0.1 mg/dL (ref 0.0–0.3)
Total Bilirubin: 0.9 mg/dL (ref 0.2–1.2)
Total Protein: 6.9 g/dL (ref 6.0–8.3)

## 2021-03-01 LAB — LIPID PANEL
Cholesterol: 199 mg/dL (ref 0–200)
HDL: 55.6 mg/dL (ref >39.00)
LDL Cholesterol: 112 mg/dL — ABNORMAL HIGH (ref 0–99)
NonHDL: 143.15
Total CHOL/HDL Ratio: 4
Triglycerides: 155 mg/dL — ABNORMAL HIGH (ref 0.0–149.0)
VLDL: 31 mg/dL (ref 0.0–40.0)

## 2021-03-01 LAB — POCT GLYCOSYLATED HEMOGLOBIN (HGB A1C): Hemoglobin A1C: 8.1 % — AB (ref 4.0–5.6)

## 2021-03-01 MED ORDER — METFORMIN HCL 500 MG PO TABS: 500.0000 mg | ORAL_TABLET | Freq: Two times a day (BID) | ORAL | 3 refills | Status: DC

## 2021-03-01 NOTE — Progress Notes (Signed)
Established Patient Office Visit  Subjective:  Patient ID: [name]., male    DOB: [birth year]  Age: 70 y.o. MRN: [id number]  CC:  Chief Complaint  Patient presents with   Follow-up    diabetes    HPI [name]. presents for medical follow-up  Type 2 diabetes.  Last A1c was 7.7%.  He has had some poor compliance with diet.  Also not as active physically.  Had recent flareup of some back pain.  He is followed by chronic pain management for that.  He had history of severe pancreatitis in the past and cannot take DPP 4 or GLP-1 medications.  Previous intolerance with Jardiance with yeast balanitis.  We had not given metformin in the past because of liver dysfunction but last liver panel was actually normal.  He has not had history of any major chronic kidney disease.  Hypertension and hyperlipidemia treated with lisinopril, amlodipine, and atorvastatin.  Compliant with other medications.  He was tapered off morphine by his chronic pain management and feels as if his GI issues have improved since coming off morphine  Past Medical History:  Diagnosis Date   Arthritis    oa--and ra.  s/p right knee replacement on Jan 28, 2011--plans left knee replacement on 03/04/11.  also severe lower back pain-previous lumbar fusions, also pain in both hips, shoulders    Aspiration pneumonia (HCC)    Asthma    Diabetes mellitus    Gallstone pancreatitis    GERD (gastroesophageal reflux disease)    Hepatitis    pancreatitis 2010   Hyperlipidemia    Hypertension    eccho 6/10, EKG 1/11, clearence Dr Elease Hashimoto from 11/28/10 on chart   Peripheral vascular disease ([place])    states after arthroscopy left and right was given blood thinners- had symptoms of blood clot but wasnt diagnosed   Pneumonia    2010   Sleep apnea    study years ago/ no cpap/ states mild    Past Surgical History:  Procedure Laterality Date   BACK SURGERY     hx of multiple back surgeries including  lumbar fusion S1-L3   BIOPSY  12/04/2018   Procedure: BIOPSY;  Surgeon: Yetta Flock, MD;  Location: [facility];  Service: Gastroenterology;;   CHOLECYSTECTOMY     COLON SURGERY     removal of part of small intestine 1979   ELBOW SURGERY     3 tendon repairs on right; 5 tendon repairs on left   ESOPHAGOGASTRODUODENOSCOPY (EGD) WITH PROPOFOL N/A 12/04/2018   Procedure: ESOPHAGOGASTRODUODENOSCOPY (EGD) WITH PROPOFOL;  Surgeon: Yetta Flock, MD;  Location: [facility];  Service: Gastroenterology;  Laterality: N/A;   SHOULDER SURGERY     left x 2, right x 3   TOTAL KNEE ARTHROPLASTY  01/28/2011   Procedure: TOTAL KNEE ARTHROPLASTY;  Surgeon: Mauri Pole;  Location: [facility];  Service: Orthopedics;  Laterality: Right;  femoral nerve block preop.   TOTAL KNEE ARTHROPLASTY  03/04/2011   Procedure: TOTAL KNEE ARTHROPLASTY;  Surgeon: Mauri Pole;  Location: [facility];  Service: Orthopedics;  Laterality: Left;    Family History  Problem Relation Age of Onset   Colon cancer Neg Hx    Esophageal cancer Neg Hx    Pancreatic cancer Neg Hx    Stomach cancer Neg Hx    Liver disease Neg Hx     Social History   Socioeconomic History   Marital status: Married    Spouse name:  Not on file   Number of children: Not on file   Years of education: Not on file   Highest education level: Not on file  Occupational History   Not on file  Tobacco Use   Smoking status: Never   Smokeless tobacco: Former    Types: Snuff    Quit date: 01/22/1991  Vaping Use   Vaping Use: Never used  Substance and Sexual Activity   Alcohol use: No   Drug use: No   Sexual activity: Not on file  Other Topics Concern   Not on file  Social History Narrative   Not on file   Social Determinants of Health   Financial Resource Strain: Not on file  Food Insecurity: Not on file  Transportation Needs: Not on file  Physical Activity: Not on file  Stress: Not on file  Social Connections: Not on file   Intimate Partner Violence: Not on file    Outpatient Medications Prior to Visit  Medication Sig Dispense Refill   amLODipine (NORVASC) 10 MG tablet TAKE 1 TABLET BY MOUTH EVERY DAY 90 tablet 1   atorvastatin (LIPITOR) 20 MG tablet Take 1 tablet (20 mg total) by mouth daily. 90 tablet 3   Azelastine HCl 137 MCG/SPRAY SOLN USE 2 SPRAYS IN EACH NOSTRIL TWICE DAILY FOR ALLERGIES 30 mL 2   B-D UF III MINI PEN NEEDLES 31G X 5 MM MISC USE TWICE A DAY WITH INSULIN PEN 100 each 0   Blood Glucose Monitoring Suppl (ONE TOUCH ULTRA 2) w/Device KIT Use as directed to check blood glucose two times daily. Dx Code E11.65 1 kit 1   docusate sodium (COLACE) 100 MG capsule Take 100 mg by mouth 3 (three) times daily as needed. Constipation      fexofenadine-pseudoephedrine (ALLEGRA-D 12 HOUR) 60-120 MG per tablet Take 1 tablet by mouth 2 (two) times daily. 60 tablet 6   glucose blood (ONETOUCH ULTRA) test strip Use as instructed to test blood glucose two times daily. Dx Code E11.65 200 each 3   insulin aspart (NOVOLOG FLEXPEN) 100 UNIT/ML FlexPen INJECT INTO THE SKIN 3 TIMES DAILY BEFORE MEALS AS PER SLIDING SCALE. 15 mL 3   insulin glargine (LANTUS SOLOSTAR) 100 UNIT/ML Solostar Pen INJECT 60 UNITS INTO THE SKIN TWICE DAILY 120 mL 1   Insulin Pen Needle 31G X 5 MM MISC Use as directed with Basaglar Kwikpen 100 each 1   Lancets (ONETOUCH ULTRASOFT) lancets Use as instructed to check blood glucose two times daily. Dx Code E11.65 200 each 3   lisinopril (ZESTRIL) 40 MG tablet TAKE 1 TABLET BY MOUTH EVERY DAY 90 tablet 1   methocarbamol (ROBAXIN) 500 MG tablet Take 500 mg by mouth every 8 (eight) hours as needed.     metoprolol succinate (TOPROL-XL) 100 MG 24 hr tablet TAKE 1 TABLET BY MOUTH EVERY DAY IN THE MORNING 90 tablet 2   pantoprazole (PROTONIX) 40 MG tablet TAKE 1 TABLET BY MOUTH EVERY DAY 90 tablet 1   polyethylene glycol (MIRALAX / GLYCOLAX) packet Take 17 g by mouth daily as needed. Constipation       No facility-administered medications prior to visit.    Allergies  Allergen Reactions   Cortisone Itching    Hives and causes blood sugar to elevate   Jardiance [Empagliflozin] Other (See Comments)    Yeast balanitis    ROS Review of Systems  Constitutional:  Negative for fatigue.  Eyes:  Negative for visual disturbance.  Respiratory:  Negative for cough,  chest tightness and shortness of breath.   Cardiovascular:  Negative for chest pain, palpitations and leg swelling.  Endocrine: Negative for polydipsia and polyuria.  Neurological:  Negative for dizziness, syncope, weakness, light-headedness and headaches.     Objective:    Physical Exam Constitutional:      Appearance: He is well-developed.  HENT:     Right Ear: External ear normal.     Left Ear: External ear normal.  Eyes:     Pupils: Pupils are equal, round, and reactive to light.  Neck:     Thyroid: No thyromegaly.  Cardiovascular:     Rate and Rhythm: Normal rate and regular rhythm.  Pulmonary:     Effort: Pulmonary effort is normal. No respiratory distress.     Breath sounds: Normal breath sounds. No wheezing or rales.  Musculoskeletal:     Cervical back: Neck supple.     Right lower leg: No edema.     Left lower leg: No edema.  Neurological:     Mental Status: He is alert and oriented to person, place, and time.    BP 140/80 (BP Location: Left Arm, Patient Position: Sitting, Cuff Size: Normal)   Pulse 75   Temp 98.2 F (36.8 C) (Oral)   Wt 282 lb 9.6 oz (128.2 kg)   SpO2 98%   BMI 34.40 kg/m  Wt Readings from Last 3 Encounters:  03/01/21 282 lb 9.6 oz (128.2 kg)  10/02/20 275 lb 3.2 oz (124.8 kg)  07/02/20 267 lb 3.2 oz (121.2 kg)     Health Maintenance Due  Topic Date Due   Zoster Vaccines- Shingrix (1 of 2) Never done   COLONOSCOPY (Pts 45-20yr Insurance coverage will need to be confirmed)  Never done   OPHTHALMOLOGY EXAM  04/23/2017   COVID-19 Vaccine (4 - Booster for Pfizer series)  05/05/2020   Pneumonia Vaccine 68 Years old (3 - PPSV23 if available, else PCV20) 05/07/2020   INFLUENZA VACCINE  10/22/2020    There are no preventive care reminders to display for this patient.  Lab Results  Component Value Date   TSH 4.64 05/30/2010   Lab Results  Component Value Date   WBC 13.4 (H) 12/04/2018   HGB 12.6 (L) 12/04/2018   HCT 36.2 (L) 12/04/2018   MCV 86.4 12/04/2018   PLT 404 (H) 12/04/2018   Lab Results  Component Value Date   NA 136 04/20/2019   K 4.5 04/20/2019   CO2 27 04/20/2019   GLUCOSE 424 (H) 04/20/2019   BUN 17 04/20/2019   CREATININE 1.06 04/20/2019   BILITOT 0.9 02/03/2020   ALKPHOS 121 (H) 02/03/2020   AST 120 (H) 02/03/2020   ALT 125 (H) 02/03/2020   PROT 6.4 02/03/2020   ALBUMIN 4.2 02/03/2020   CALCIUM 9.1 04/20/2019   ANIONGAP 14 12/05/2018   GFR 69.84 04/20/2019   Lab Results  Component Value Date   CHOL 117 02/03/2020   Lab Results  Component Value Date   HDL 52.50 02/03/2020   Lab Results  Component Value Date   LDLCALC 36 02/03/2020   Lab Results  Component Value Date   TRIG 143.0 02/03/2020   Lab Results  Component Value Date   CHOLHDL 2 02/03/2020   Lab Results  Component Value Date   HGBA1C 8.1 (A) 03/01/2021      Assessment & Plan:   #1 type 2 diabetes poorly controlled with A1c up today to 8.1%.  We discussed addition of metformin since he has had normal  renal function and recent liver function improved.  He cannot take DPP 4 or GLP-1 or SGLT2 medications for reasons as above Flu vaccine given Set up 95-monthfollow-up  #2 hyperlipidemia treated with Lipitor -Check lipid and hepatic panel  #3 hypertension. -Recheck basic metabolic panel -Try to step up activities and lose some weight.   Meds ordered this encounter  Medications   metFORMIN (GLUCOPHAGE) 500 MG tablet    Sig: Take 1 tablet (500 mg total) by mouth 2 (two) times daily with a meal.    Dispense:  60 tablet    Refill:  3     Follow-up: Return in about 3 months (around 05/30/2021).    BCarolann Littler MD

## 2021-03-01 NOTE — Patient Instructions (Signed)
A1C today up to 8.1%  Start the Metformin 500 mg twice daily.

## 2021-03-04 DIAGNOSIS — Z79891 Long term (current) use of opiate analgesic: Secondary | ICD-10-CM | POA: Diagnosis not present

## 2021-03-04 DIAGNOSIS — G894 Chronic pain syndrome: Secondary | ICD-10-CM | POA: Diagnosis not present

## 2021-03-04 DIAGNOSIS — E1142 Type 2 diabetes mellitus with diabetic polyneuropathy: Secondary | ICD-10-CM | POA: Diagnosis not present

## 2021-03-04 DIAGNOSIS — M961 Postlaminectomy syndrome, not elsewhere classified: Secondary | ICD-10-CM | POA: Diagnosis not present

## 2021-03-15 ENCOUNTER — Other Ambulatory Visit: Payer: Self-pay | Admitting: Family Medicine

## 2021-03-27 ENCOUNTER — Other Ambulatory Visit: Payer: Self-pay | Admitting: Family Medicine

## 2021-05-02 DIAGNOSIS — Z79891 Long term (current) use of opiate analgesic: Secondary | ICD-10-CM | POA: Diagnosis not present

## 2021-05-02 DIAGNOSIS — E1142 Type 2 diabetes mellitus with diabetic polyneuropathy: Secondary | ICD-10-CM | POA: Diagnosis not present

## 2021-05-02 DIAGNOSIS — M961 Postlaminectomy syndrome, not elsewhere classified: Secondary | ICD-10-CM | POA: Diagnosis not present

## 2021-05-02 DIAGNOSIS — G894 Chronic pain syndrome: Secondary | ICD-10-CM | POA: Diagnosis not present

## 2021-05-21 ENCOUNTER — Telehealth: Payer: Self-pay | Admitting: Pharmacist

## 2021-05-21 NOTE — Chronic Care Management (AMB) (Signed)
° ° °  Chronic Care Management Pharmacy Assistant   Name: [name].  MRN: [id number] DOB: 1951/02/15  Reason for Encounter: Offer to Reschedule Missed Initial appointment with [name] Clinical Pharmacist   Recent office visits:  03/01/21 [name], MD - Patient presented for Type 2 diabetes without complication without long term current use of insulin and other concerns. Prescribed Metformin HCL  Recent consult visits:  None   Hospital visits:  None in previous 6 months  Medications: Outpatient Encounter Medications as of 05/21/2021  Medication Sig   amLODipine (NORVASC) 10 MG tablet TAKE 1 TABLET BY MOUTH EVERY DAY   atorvastatin (LIPITOR) 20 MG tablet Take 1 tablet (20 mg total) by mouth daily.   Azelastine HCl 137 MCG/SPRAY SOLN USE 2 SPRAYS IN EACH NOSTRIL TWICE DAILY FOR ALLERGIES   B-D UF III MINI PEN NEEDLES 31G X 5 MM MISC USE TWICE A DAY WITH INSULIN PEN   Blood Glucose Monitoring Suppl (ONE TOUCH ULTRA 2) w/Device KIT Use as directed to check blood glucose two times daily. Dx Code E11.65   docusate sodium (COLACE) 100 MG capsule Take 100 mg by mouth 3 (three) times daily as needed. Constipation    fexofenadine-pseudoephedrine (ALLEGRA-D 12 HOUR) 60-120 MG per tablet Take 1 tablet by mouth 2 (two) times daily.   glucose blood (ONETOUCH ULTRA) test strip Use as instructed to test blood glucose two times daily. Dx Code E11.65   insulin aspart (NOVOLOG FLEXPEN) 100 UNIT/ML FlexPen INJECT INTO THE SKIN 3 TIMES DAILY BEFORE MEALS AS PER SLIDING SCALE.   insulin glargine (LANTUS SOLOSTAR) 100 UNIT/ML Solostar Pen INJECT 60 UNITS INTO THE SKIN TWICE DAILY   Insulin Pen Needle 31G X 5 MM MISC Use as directed with Basaglar Kwikpen   Lancets (ONETOUCH ULTRASOFT) lancets Use as instructed to check blood glucose two times daily. Dx Code E11.65   lisinopril (ZESTRIL) 40 MG tablet Take 1 tablet (40 mg total) by mouth daily.   metFORMIN (GLUCOPHAGE) 500 MG tablet  Take 1 tablet (500 mg total) by mouth 2 (two) times daily with a meal.   methocarbamol (ROBAXIN) 500 MG tablet Take 500 mg by mouth every 8 (eight) hours as needed.   metoprolol succinate (TOPROL-XL) 100 MG 24 hr tablet TAKE 1 TABLET BY MOUTH EVERY DAY IN THE MORNING   pantoprazole (PROTONIX) 40 MG tablet TAKE 1 TABLET BY MOUTH EVERY DAY   polyethylene glycol (MIRALAX / GLYCOLAX) packet Take 17 g by mouth daily as needed. Constipation    No facility-administered encounter medications on file as of 05/21/2021.  Notes: Call to patient to offer to reschedule missed Initial visit with [name] Pharmacist. Did not reach patient left voicemail with my contact information for return call.  Care Gaps: Zoster Vaccine - Overdue Colonoscopy - Overdue Eye Exam  - Overdue COVID Booster - Overdue PNA Vaccine - Overdue BP- 140/80 ( 03/01/21) AWV- office aware to sched as of 10/22 Lab Results  Component Value Date   HGBA1C 8.1 (A) 03/01/2021    Star Rating Drugs: Lisinopril (Zestril ) 40 mg - Last filled 12/26/20 [facility]( verified as accurate) Atorvastatin (Lipitor) 20 mg - Last filled 07/18/20 ( verified as accurate) Metformin 500 mg - Last filled 03/01/21 90 DS at [facility] Pharmacist Assistant [phone]

## 2021-05-28 ENCOUNTER — Other Ambulatory Visit: Payer: Self-pay | Admitting: Family Medicine

## 2021-05-28 DIAGNOSIS — E119 Type 2 diabetes mellitus without complications: Secondary | ICD-10-CM

## 2021-05-31 ENCOUNTER — Ambulatory Visit: Payer: HMO | Admitting: Family Medicine

## 2021-06-03 ENCOUNTER — Other Ambulatory Visit: Payer: Self-pay | Admitting: Family Medicine

## 2021-06-03 DIAGNOSIS — E119 Type 2 diabetes mellitus without complications: Secondary | ICD-10-CM

## 2021-06-17 ENCOUNTER — Telehealth: Payer: Self-pay | Admitting: Family Medicine

## 2021-06-17 NOTE — Telephone Encounter (Signed)
Left message for patient to call back and schedule Medicare Annual Wellness Visit (AWV) either virtually or in office. Left  my jabber number [phone]    AWV-I PER [place]  12/23/18 ; please schedule at anytime with [facility] Nurse Health Advisor 1 or 2   This should be a 45 minute visit.  

## 2021-06-27 DIAGNOSIS — G894 Chronic pain syndrome: Secondary | ICD-10-CM | POA: Diagnosis not present

## 2021-06-27 DIAGNOSIS — Z79891 Long term (current) use of opiate analgesic: Secondary | ICD-10-CM | POA: Diagnosis not present

## 2021-06-27 DIAGNOSIS — E1142 Type 2 diabetes mellitus with diabetic polyneuropathy: Secondary | ICD-10-CM | POA: Diagnosis not present

## 2021-06-27 DIAGNOSIS — M961 Postlaminectomy syndrome, not elsewhere classified: Secondary | ICD-10-CM | POA: Diagnosis not present

## 2021-07-09 ENCOUNTER — Other Ambulatory Visit: Payer: Self-pay | Admitting: Family Medicine

## 2021-07-22 ENCOUNTER — Ambulatory Visit (INDEPENDENT_AMBULATORY_CARE_PROVIDER_SITE_OTHER): Payer: HMO | Admitting: Family Medicine

## 2021-07-22 ENCOUNTER — Encounter: Payer: Self-pay | Admitting: Family Medicine

## 2021-07-22 VITALS — BP 160/80 | HR 81 | Temp 97.7°F | Ht 76.0 in | Wt 287.5 lb

## 2021-07-22 DIAGNOSIS — I10 Essential (primary) hypertension: Secondary | ICD-10-CM | POA: Diagnosis not present

## 2021-07-22 DIAGNOSIS — E119 Type 2 diabetes mellitus without complications: Secondary | ICD-10-CM | POA: Diagnosis not present

## 2021-07-22 LAB — POCT GLYCOSYLATED HEMOGLOBIN (HGB A1C): Hemoglobin A1C: 7.9 % — AB (ref 4.0–5.6)

## 2021-07-22 MED ORDER — HYDROCHLOROTHIAZIDE 12.5 MG PO CAPS: 12.5000 mg | ORAL_CAPSULE | Freq: Every day | ORAL | 3 refills | Status: DC

## 2021-07-22 NOTE — Progress Notes (Signed)
? ?Established Patient Office Visit ? ?Subjective   ?Patient ID: Michael Gin., male    DOB: 1950-05-09  Age: 71 y.o. MRN: [id number] ? ?Chief Complaint  ?Patient presents with  ? Follow-up  ? ? ?HPI ? ? ?Here for medical follow-up.  He has history of type 2 diabetes.  Last A1c was 8.1%.  He declined further medications at time.  Previous intolerance with Jardiance with yeast balanitis.  History of pancreatitis which excludes use of GLP-1 or DPP 4 medication ?We had added metformin last visit but he became concerned after reading some things on the Internet and stopped taking this.  He did not have any side effects. ? ?He has gained about 5 pounds since last visit.  Very sedentary.  No recent dietary changes.  Blood pressure last visit was 140/80.  Up-to-date.  Current blood pressure medications include amlodipine 10 mg daily, lisinopril 40 mg daily, metoprolol succinate 100 mg daily.  Compliant with those medications. ? ?Past Medical History:  ?Diagnosis Date  ? Arthritis   ? oa--and ra.  s/p right knee replacement on Jan 28, 2011--plans left knee replacement on 03/04/11.  also severe lower back pain-previous lumbar fusions, also pain in both hips, shoulders   ? Aspiration pneumonia (HCC)   ? Asthma   ? Diabetes mellitus   ? Gallstone pancreatitis   ? GERD (gastroesophageal reflux disease)   ? Hepatitis   ? pancreatitis 2010  ? Hyperlipidemia   ? Hypertension   ? eccho 6/10, EKG 1/11, clearence Dr Caryl Never from 11/28/10 on chart  ? Peripheral vascular disease ([facility])   ? states after arthroscopy left and right was given blood thinners- had symptoms of blood clot but wasnt diagnosed  ? Pneumonia   ? 2010  ? Sleep apnea   ? study years ago/ no cpap/ states mild  ? ?Past Surgical History:  ?Procedure Laterality Date  ? BACK SURGERY    ? hx of multiple back surgeries including lumbar fusion S1-L3  ? BIOPSY  12/04/2018  ? Procedure: BIOPSY;  Surgeon: Benancio Deeds, MD;  Location: [facility];  Service:  Gastroenterology;;  ? CHOLECYSTECTOMY    ? COLON SURGERY    ? removal of part of small intestine 1979  ? ELBOW SURGERY    ? 3 tendon repairs on right; 5 tendon repairs on left  ? ESOPHAGOGASTRODUODENOSCOPY (EGD) WITH PROPOFOL N/A 12/04/2018  ? Procedure: ESOPHAGOGASTRODUODENOSCOPY (EGD) WITH PROPOFOL;  Surgeon: Benancio Deeds, MD;  Location: [facility];  Service: Gastroenterology;  Laterality: N/A;  ? SHOULDER SURGERY    ? left x 2, right x 3  ? TOTAL KNEE ARTHROPLASTY  01/28/2011  ? Procedure: TOTAL KNEE ARTHROPLASTY;  Surgeon: Shelda Pal;  Location: [facility];  Service: Orthopedics;  Laterality: Right;  femoral nerve block preop.  ? TOTAL KNEE ARTHROPLASTY  03/04/2011  ? Procedure: TOTAL KNEE ARTHROPLASTY;  Surgeon: Shelda Pal;  Location: [facility];  Service: Orthopedics;  Laterality: Left;  ? ? reports that he has never smoked. He quit smokeless tobacco use about 30 years ago.  His smokeless tobacco use included snuff. He reports that he does not drink alcohol and does not use drugs. ?family history is not on file. ?Allergies  ?Allergen Reactions  ? Cortisone Itching  ?  Hives and causes blood sugar to elevate  ? Jardiance [Empagliflozin] Other (See Comments)  ?  Yeast balanitis  ? ? ?Review of Systems  ?Constitutional:  Negative for chills, fever and weight loss.  ?  Respiratory:  Negative for cough.   ?Cardiovascular:  Negative for chest pain.  ?Genitourinary:  Negative for dysuria.  ? ?  ?Objective:  ?  ? ?BP (!) 160/80 (BP Location: Left Arm, Patient Position: Sitting, Cuff Size: Normal)   Pulse 81   Temp 97.7 ?F (36.5 ?C) (Oral)   Ht 6\' 4"  (1.93 m)   Wt 287 lb 8 oz (130.4 kg)   SpO2 97%   BMI 35.00 kg/m?  ?Wt Readings from Last 3 Encounters:  ?07/22/21 287 lb 8 oz (130.4 kg)  ?03/01/21 282 lb 9.6 oz (128.2 kg)  ?10/02/20 275 lb 3.2 oz (124.8 kg)  ? ?  ? ?Physical Exam ?Vitals reviewed.  ?Cardiovascular:  ?   Rate and Rhythm: Normal rate and regular rhythm.  ?Pulmonary:  ?   Effort: Pulmonary  effort is normal.  ?   Breath sounds: Normal breath sounds.  ?Musculoskeletal:  ?   Right lower leg: No edema.  ?   Left lower leg: No edema.  ?Neurological:  ?   Mental Status: He is alert.  ? ? ? ?Results for orders placed or performed in visit on 07/22/21  ?POCT glycosylated hemoglobin (Hb A1C)  ?Result Value Ref Range  ? Hemoglobin A1C 7.9 (A) 4.0 - 5.6 %  ? HbA1c POC (<> result, manual entry)    ? HbA1c, POC (prediabetic range)    ? HbA1c, POC (controlled diabetic range)    ? ? ?Last hemoglobin A1c ?Lab Results  ?Component Value Date  ? HGBA1C 7.9 (A) 07/22/2021  ? ?  ? ?The 10-year ASCVD risk score (Arnett DK, et al., 2019) is: 42.2% ? ?  ?Assessment & Plan:  ? ?#1 type 2 diabetes suboptimally controlled with A1c 7.9%.  This is slightly improved from previous of 8.1%.  Patient already on high-dose insulin.  Not a candidate for GLP-1 or DPP 4 inhibitors with history of pancreatitis.  Previous yeast balanitis with Jardiance.  We had recently started metformin but he quit taking because of concerns with things he read on the Internet.  He will start back metformin at this time.  He was tolerating well without side effect.  Reassess in 3 months. ? ?#2 hypertension poorly controlled.  This is up also above goal last visit.  Work on weight loss.  Keep daily sodium intake less than 2500 mg ?-Add HCTZ 12.5 mg daily to current regimen of amlodipine, lisinopril, and metoprolol. ? ? ?No follow-ups on file.  ? ? ?2020, MD ? ?

## 2021-07-22 NOTE — Patient Instructions (Signed)
Monitor blood pressure and be in touch if consistently > 130/80 ? ?Consider bringing your BP cuff at next follow up.  ? ?A1C today is 7.9% ?

## 2021-07-26 ENCOUNTER — Other Ambulatory Visit: Payer: Self-pay | Admitting: Family Medicine

## 2021-08-04 ENCOUNTER — Other Ambulatory Visit: Payer: Self-pay | Admitting: Family Medicine

## 2021-08-04 DIAGNOSIS — E119 Type 2 diabetes mellitus without complications: Secondary | ICD-10-CM

## 2021-08-09 ENCOUNTER — Telehealth: Payer: Self-pay | Admitting: Family Medicine

## 2021-08-09 NOTE — Telephone Encounter (Signed)
Left message for patient to call back and schedule Medicare Annual Wellness Visit (AWV) either virtually or in office. Left  my jabber number [phone]    AWV-I PER [place]  12/23/18 ; please schedule at anytime with [facility] Nurse Health Advisor 1 or 2   This should be a 45 minute visit.  

## 2021-08-22 DIAGNOSIS — Z79891 Long term (current) use of opiate analgesic: Secondary | ICD-10-CM | POA: Diagnosis not present

## 2021-08-22 DIAGNOSIS — E1142 Type 2 diabetes mellitus with diabetic polyneuropathy: Secondary | ICD-10-CM | POA: Diagnosis not present

## 2021-08-22 DIAGNOSIS — G894 Chronic pain syndrome: Secondary | ICD-10-CM | POA: Diagnosis not present

## 2021-08-22 DIAGNOSIS — M961 Postlaminectomy syndrome, not elsewhere classified: Secondary | ICD-10-CM | POA: Diagnosis not present

## 2021-09-02 ENCOUNTER — Telehealth: Payer: Self-pay | Admitting: Family Medicine

## 2021-09-02 NOTE — Telephone Encounter (Signed)
Left message for patient to call back and schedule Medicare Annual Wellness Visit (AWV) either virtually or in office. Left  my jabber number [phone]    AWV-I PER [place]  12/23/18 ; please schedule at anytime with [facility] Nurse Health Advisor 1 or 2   This should be a 45 minute visit.  

## 2021-10-14 ENCOUNTER — Telehealth: Payer: Self-pay | Admitting: Family Medicine

## 2021-10-14 NOTE — Telephone Encounter (Signed)
Left message for patient to call back and schedule Medicare Annual Wellness Visit (AWV) either virtually or in office. Left  my Zachery Conch number [phone]    AWV-I PER [place]  12/23/18 ; please schedule at anytime with [facility] Nurse Health Advisor 1 or 2   This should be a 45 minute visit.

## 2021-10-15 ENCOUNTER — Telehealth: Payer: Self-pay | Admitting: Pharmacist

## 2021-10-15 NOTE — Chronic Care Management (AMB) (Signed)
    Chronic Care Management Pharmacy Assistant   Name: [name].  MRN: [id number] DOB: [birth year]  Reason for Encounter:  2nd attempt to Offer to Reschedule Missed Initial appointment with [facility] Pharmacist   Recent office visits:  07/22/21 Michael Post, MD - Patient presented for Type 2 diabetes mellitus without complication without long term current use of insulin and other concerns.  Prescribed HCTZ 12.5 mg.   Recent consult visits:  None  Hospital visits:  None in previous 6 months  Medications: Outpatient Encounter Medications as of 10/15/2021  Medication Sig   amLODipine (NORVASC) 10 MG tablet TAKE 1 TABLET BY MOUTH EVERY DAY   atorvastatin (LIPITOR) 20 MG tablet Take 1 tablet (20 mg total) by mouth daily.   Azelastine HCl 137 MCG/SPRAY SOLN USE 2 SPRAYS IN EACH NOSTRIL TWICE DAILY FOR ALLERGIES   Blood Glucose Monitoring Suppl (ONE TOUCH ULTRA 2) w/Device KIT Use as directed to check blood glucose two times daily. Dx Code E11.65   docusate sodium (COLACE) 100 MG capsule Take 100 mg by mouth 3 (three) times daily as needed. Constipation    fexofenadine-pseudoephedrine (ALLEGRA-D 12 HOUR) 60-120 MG per tablet Take 1 tablet by mouth 2 (two) times daily.   glucose blood (ONETOUCH ULTRA) test strip Use as instructed to test blood glucose two times daily. Dx Code E11.65   hydrochlorothiazide (MICROZIDE) 12.5 MG capsule Take 1 capsule (12.5 mg total) by mouth daily.   Insulin Pen Needle (B-D UF III MINI PEN NEEDLES) 31G X 5 MM MISC USE TWICE A DAY WITH INSULIN PEN   Insulin Pen Needle 31G X 5 MM MISC Use as directed with Basaglar Kwikpen   Lancets (ONETOUCH ULTRASOFT) lancets Use as instructed to check blood glucose two times daily. Dx Code E11.65   LANTUS SOLOSTAR 100 UNIT/ML Solostar Pen INJECT 60 UNITS INTO THE SKIN TWICE DAILY   lisinopril (ZESTRIL) 40 MG tablet TAKE 1 TABLET BY MOUTH EVERY DAY   metFORMIN (GLUCOPHAGE) 500 MG tablet TAKE 1 TABLET  BY MOUTH TWICE A DAY WITH MEALS   methocarbamol (ROBAXIN) 500 MG tablet Take 500 mg by mouth every 8 (eight) hours as needed.   metoprolol succinate (TOPROL-XL) 100 MG 24 hr tablet TAKE 1 TABLET BY MOUTH EVERY DAY IN THE MORNING   NOVOLOG FLEXPEN 100 UNIT/ML FlexPen INJECT INTO THE SKIN 3 TIMES DAILY BEFORE MEALS AS PER SLIDING SCALE.   pantoprazole (PROTONIX) 40 MG tablet TAKE 1 TABLET BY MOUTH EVERY DAY   polyethylene glycol (MIRALAX / GLYCOLAX) packet Take 17 g by mouth daily as needed. Constipation    No facility-administered encounter medications on file as of 10/15/2021.   Notes: Call to patient to offer to reschedule missed Initial visit with [facility] Pharmacist. Did not reach patient left voicemail with my contact information for return call.  Care Gaps: Zoster Vaccine - Overdue Colonoscopy - Overdue Eye Exam - Overdue COVID Booster - Overdue PNA Vaccine - Overdue Foot Exam - Overdue AWV-office aware to sched as of 10/22 BP- 160/80 07/22/21 Lab Results  Component Value Date   HGBA1C 7.9 (A) 07/22/2021    Star Rating Drugs: Lisinopril (Zestril ) 40 mg - [facility]20 [facility] Atorvastatin (Lipitor) 20 mg - Last filled 07/18/20  Metformin 500 mg - Last filled 05/22/2021 [facility] Verified    [place] Pharmacist Assistant [phone]

## 2021-10-18 ENCOUNTER — Encounter: Payer: Self-pay | Admitting: Pharmacist

## 2021-10-18 DIAGNOSIS — Z9229 Personal history of other drug therapy: Secondary | ICD-10-CM

## 2021-10-18 NOTE — Progress Notes (Signed)
[facility])                                            [facility] Quality Pharmacy Team                                        Statin Quality Measure Assessment    10/18/2021  [name]. 1950/04/24 [id number]  Per review of chart and payor information, patient has a diagnosis of diabetes but is not currently filling a statin prescription.  This places patient into the SUPD (Statin Use In Patients with Diabetes) measure for CMS.    Pravastatin is on the patient's medication list but has not been filled this year.  Patient was called to inquire but he did not answer the phone and a message was left. He has an upcoming follow up appointment 10/21/21.  If deemed therapeutically appropriate, statin therapy could be evaluated during the visit.  The 10-year ASCVD risk score (Arnett DK, et al., 2019) is: 42.2%   Values used to calculate the score:     Age: 71 years     Sex: Male     Is Non-Hispanic African American: No     Diabetic: Yes     Tobacco smoker: No     Systolic Blood Pressure: 160 mmHg     Is BP treated: Yes     HDL Cholesterol: 55.6 mg/dL     Total Cholesterol: 199 mg/dL 66/07/9933     Component Value Date/Time   CHOL 199 03/01/2021 0759   TRIG 155.0 (H) 03/01/2021 0759   HDL 55.60 03/01/2021 0759   CHOLHDL 4 03/01/2021 0759   VLDL 31.0 03/01/2021 0759   LDLCALC 112 (H) 03/01/2021 0759   LDLDIRECT 102.0 01/27/2018 0742    Please consider ONE of the following recommendations:  Initiate high intensity statin Atorvastatin 40mg  once daily, #90, 3 refills   Rosuvastatin 20mg  once daily, #90, 3 refills    Initiate moderate intensity          statin with reduced frequency if prior          statin intolerance 1x weekly, #13, 3 refills   2x weekly, #26, 3 refills   3x weekly, #39, 3 refills    Code for past statin intolerance or  other exclusions (required annually)   Provider Requirements:  Associate code  during an office visit or telehealth encounter  Drug Induced Myopathy G72.0   Myopathy, unspecified G72.9   Myositis, unspecified M60.9   Rhabdomyolysis M62.82   Alcoholic fatty liver K70.0   Cirrhosis of liver K74.69   Prediabetes R73.03   PCOS E28.2   Toxic liver disease, unspecified K71.9         Plan: Route note to PCP  , PharmD, BCACP [facility] Clinical Pharmacist [phone]

## 2021-10-21 ENCOUNTER — Ambulatory Visit (INDEPENDENT_AMBULATORY_CARE_PROVIDER_SITE_OTHER): Payer: HMO | Admitting: Family Medicine

## 2021-10-21 ENCOUNTER — Encounter: Payer: Self-pay | Admitting: Family Medicine

## 2021-10-21 VITALS — BP 152/78 | HR 75 | Temp 97.9°F | Ht 76.0 in | Wt 281.1 lb

## 2021-10-21 DIAGNOSIS — Z789 Other specified health status: Secondary | ICD-10-CM | POA: Diagnosis not present

## 2021-10-21 DIAGNOSIS — E119 Type 2 diabetes mellitus without complications: Secondary | ICD-10-CM

## 2021-10-21 DIAGNOSIS — G720 Drug-induced myopathy: Secondary | ICD-10-CM

## 2021-10-21 DIAGNOSIS — T466X5A Adverse effect of antihyperlipidemic and antiarteriosclerotic drugs, initial encounter: Secondary | ICD-10-CM | POA: Diagnosis not present

## 2021-10-21 DIAGNOSIS — I10 Essential (primary) hypertension: Secondary | ICD-10-CM | POA: Diagnosis not present

## 2021-10-21 LAB — BASIC METABOLIC PANEL
BUN: 13 mg/dL (ref 6–23)
CO2: 29 mEq/L (ref 19–32)
Calcium: 9.6 mg/dL (ref 8.4–10.5)
Chloride: 99 mEq/L (ref 96–112)
Creatinine, Ser: 1.08 mg/dL (ref 0.40–1.50)
GFR: 70.38 mL/min (ref >60.00)
Glucose, Bld: 324 mg/dL — ABNORMAL HIGH (ref 70–99)
Potassium: 3.7 mEq/L (ref 3.5–5.1)
Sodium: 140 mEq/L (ref 135–145)

## 2021-10-21 LAB — POCT GLYCOSYLATED HEMOGLOBIN (HGB A1C): Hemoglobin A1C: 7.8 % — AB (ref 4.0–5.6)

## 2021-10-21 NOTE — Progress Notes (Signed)
Established Patient Office Visit  Subjective   Patient ID: Michael Swicegood., male    DOB: 03/01/1951  Age: 71 y.o. MRN: [id number]  Chief Complaint  Patient presents with   Follow-up    HPI   Seen for medical follow-up.  Chronic problems include hypertension, history of gallstone pancreatitis, fatty liver, type 2 diabetes, osteoarthritis involving multiple joints, hyperlipidemia.  Last visit we added HCTZ to his blood pressure regimen.  He states home blood pressures have been ranging 135-140.  Compliant with therapy.  No side effects.  Blood sugars overall stable.  Slightly improved compared with last visit.  He has had previous pancreatitis and cannot take GLP-1 or SGLT2 medications.  He did go back on metformin recently and is tolerating well without side effects.  He hopes to lose some weight soon.  Tried to establish more walking recently.  Ongoing chronic pain managed by pain management.  He is apparently off morphine at this time.  Sometimes has severe neuropathic pain.  Previous intolerance with gabapentin and Lyrica.  He has very high risk for CAD and peripheral vascular disease.  Has been on various statins in the past and has atorvastatin on his med list but apparently has not been getting this filled per pharmacy.  Previous intolerance.  Past Medical History:  Diagnosis Date   Arthritis    oa--and ra.  s/p right knee replacement on Jan 28, 2011--plans left knee replacement on 03/04/11.  also severe lower back pain-previous lumbar fusions, also pain in both hips, shoulders    Aspiration pneumonia (HCC)    Asthma    Diabetes mellitus    Gallstone pancreatitis    GERD (gastroesophageal reflux disease)    Hepatitis    pancreatitis 2010   Hyperlipidemia    Hypertension    eccho 6/10, EKG 1/11, clearence Dr Caryl Never from 11/28/10 on chart   Peripheral vascular disease (HCC)    states after arthroscopy left and right was given blood thinners- had symptoms of blood clot  but wasnt diagnosed   Pneumonia    2010   Sleep apnea    study years ago/ no cpap/ states mild   Past Surgical History:  Procedure Laterality Date   BACK SURGERY     hx of multiple back surgeries including lumbar fusion S1-L3   BIOPSY  12/04/2018   Procedure: BIOPSY;  Surgeon: Benancio Deeds, MD;  Location: [facility];  Service: Gastroenterology;;   CHOLECYSTECTOMY     COLON SURGERY     removal of part of small intestine 1979   ELBOW SURGERY     3 tendon repairs on right; 5 tendon repairs on left   ESOPHAGOGASTRODUODENOSCOPY (EGD) WITH PROPOFOL N/A 12/04/2018   Procedure: ESOPHAGOGASTRODUODENOSCOPY (EGD) WITH PROPOFOL;  Surgeon: Benancio Deeds, MD;  Location: [facility];  Service: Gastroenterology;  Laterality: N/A;   SHOULDER SURGERY     left x 2, right x 3   TOTAL KNEE ARTHROPLASTY  01/28/2011   Procedure: TOTAL KNEE ARTHROPLASTY;  Surgeon: Shelda Pal;  Location: [facility];  Service: Orthopedics;  Laterality: Right;  femoral nerve block preop.   TOTAL KNEE ARTHROPLASTY  03/04/2011   Procedure: TOTAL KNEE ARTHROPLASTY;  Surgeon: Shelda Pal;  Location: [facility];  Service: Orthopedics;  Laterality: Left;    reports that he has never smoked. He quit smokeless tobacco use about 30 years ago.  His smokeless tobacco use included snuff. He reports that he does not drink alcohol and does not use drugs.  family history is not on file. Allergies  Allergen Reactions   Cortisone Itching    Hives and causes blood sugar to elevate   Jardiance [Empagliflozin] Other (See Comments)    Yeast balanitis    Review of Systems  Constitutional:  Negative for malaise/fatigue.  Eyes:  Negative for blurred vision.  Respiratory:  Negative for shortness of breath.   Cardiovascular:  Negative for chest pain.  Gastrointestinal:  Negative for abdominal pain.  Musculoskeletal:  Positive for back pain and joint pain.  Neurological:  Negative for dizziness, weakness and headaches.       Objective:     BP (!) 152/78 (BP Location: Left Arm, Patient Position: Sitting, Cuff Size: Large)   Pulse 75   Temp 97.9 F (36.6 C) (Oral)   Ht 6\' 4"  (1.93 m)   Wt 281 lb 1.6 oz (127.5 kg)   SpO2 99%   BMI 34.22 kg/m  BP Readings from Last 3 Encounters:  10/21/21 (!) 152/78  07/22/21 (!) 160/80  03/01/21 140/80   Wt Readings from Last 3 Encounters:  10/21/21 281 lb 1.6 oz (127.5 kg)  07/22/21 287 lb 8 oz (130.4 kg)  03/01/21 282 lb 9.6 oz (128.2 kg)      Physical Exam Vitals reviewed.  Cardiovascular:     Rate and Rhythm: Normal rate and regular rhythm.  Pulmonary:     Effort: Pulmonary effort is normal.     Breath sounds: Normal breath sounds.  Neurological:     General: No focal deficit present.     Mental Status: He is alert.      Results for orders placed or performed in visit on 10/21/21  POCT glycosylated hemoglobin (Hb A1C)  Result Value Ref Range   Hemoglobin A1C 7.8 (A) 4.0 - 5.6 %   HbA1c POC (<> result, manual entry)     HbA1c, POC (prediabetic range)     HbA1c, POC (controlled diabetic range)      Last CBC Lab Results  Component Value Date   WBC 13.4 (H) 12/04/2018   HGB 12.6 (L) 12/04/2018   HCT 36.2 (L) 12/04/2018   MCV 86.4 12/04/2018   MCH 30.1 12/04/2018   RDW 11.5 12/04/2018   PLT 404 (H) 12/04/2018   Last metabolic panel Lab Results  Component Value Date   GLUCOSE 240 (H) 03/01/2021   NA 139 03/01/2021   K 3.9 03/01/2021   CL 103 03/01/2021   CO2 27 03/01/2021   BUN 13 03/01/2021   CREATININE 1.07 03/01/2021   GFRNONAA >60 12/05/2018   CALCIUM 9.2 03/01/2021   PHOS 4.2 09/07/2008   PROT 6.9 03/01/2021   ALBUMIN 4.4 03/01/2021   BILITOT 0.9 03/01/2021   ALKPHOS 70 03/01/2021   AST 18 03/01/2021   ALT 18 03/01/2021   ANIONGAP 14 12/05/2018   Last lipids Lab Results  Component Value Date   CHOL 199 03/01/2021   HDL 55.60 03/01/2021   LDLCALC 112 (H) 03/01/2021   LDLDIRECT 102.0 01/27/2018   TRIG 155.0 (H)  03/01/2021   CHOLHDL 4 03/01/2021   Last hemoglobin A1c Lab Results  Component Value Date   HGBA1C 7.8 (A) 10/21/2021   Last thyroid functions Lab Results  Component Value Date   TSH 4.64 05/30/2010      The 10-year ASCVD risk score (Arnett DK, et al., 2019) is: 39.3%    Assessment & Plan:   #1 hypertension.  Still elevated some today by systolic reading but improved by home readings.  We discussed  options.  Rather than adding more medication he prefers 79-month trial of some additional weight loss and this seems reasonable.  Continue low-sodium diet.  Consider possible addition of hydralazine at follow-up if not to goal.  Could also look at chlorthalidone which may be more potent at lowering blood pressure than HCTZ.  Check basic metabolic panel today  #2 type 2 diabetes.  Slightly improved with A1c a 7.8%.  He prefers 3 more months of lifestyle modification before adding further medication  #3 hyperlipidemia.  He has been on statin but has not had filled recently.  Has had some myalgia issues and concerns. Discuss PCSK9 inhibitor   Return in about 3 months (around 01/21/2022).    Evelena Peat, MD

## 2021-10-21 NOTE — Patient Instructions (Signed)
A1C has improved some to 7.8%

## 2021-10-22 DIAGNOSIS — Z79891 Long term (current) use of opiate analgesic: Secondary | ICD-10-CM | POA: Diagnosis not present

## 2021-10-22 DIAGNOSIS — M961 Postlaminectomy syndrome, not elsewhere classified: Secondary | ICD-10-CM | POA: Diagnosis not present

## 2021-10-22 DIAGNOSIS — E1142 Type 2 diabetes mellitus with diabetic polyneuropathy: Secondary | ICD-10-CM | POA: Diagnosis not present

## 2021-10-22 DIAGNOSIS — G894 Chronic pain syndrome: Secondary | ICD-10-CM | POA: Diagnosis not present

## 2021-11-13 ENCOUNTER — Telehealth: Payer: Self-pay | Admitting: Family Medicine

## 2021-11-13 NOTE — Telephone Encounter (Signed)
Left message for patient to call back and schedule Medicare Annual Wellness Visit (AWV) either virtually or in office. Left  my jabber number [phone]    AWV-I PER [place]  12/23/18 ; please schedule at anytime with [facility] Nurse Health Advisor 1 or 2   This should be a 45 minute visit.  

## 2021-11-23 ENCOUNTER — Other Ambulatory Visit: Payer: Self-pay | Admitting: Family Medicine

## 2021-12-01 ENCOUNTER — Other Ambulatory Visit: Payer: Self-pay | Admitting: Family Medicine

## 2021-12-04 ENCOUNTER — Telehealth: Payer: Self-pay | Admitting: Family Medicine

## 2021-12-04 NOTE — Telephone Encounter (Signed)
Left message for patient to call back and schedule Medicare Annual Wellness Visit (AWV) either virtually or in office. Left  my Zachery Conch number [phone]   AWV-I PER [place]  12/23/18 ; please schedule at anytime with [facility] Nurse Health Advisor 1 or 2   This should be a 45 minute visit.

## 2021-12-15 ENCOUNTER — Other Ambulatory Visit: Payer: Self-pay | Admitting: Family Medicine

## 2021-12-18 DIAGNOSIS — M961 Postlaminectomy syndrome, not elsewhere classified: Secondary | ICD-10-CM | POA: Diagnosis not present

## 2021-12-18 DIAGNOSIS — E1142 Type 2 diabetes mellitus with diabetic polyneuropathy: Secondary | ICD-10-CM | POA: Diagnosis not present

## 2021-12-18 DIAGNOSIS — G894 Chronic pain syndrome: Secondary | ICD-10-CM | POA: Diagnosis not present

## 2021-12-18 DIAGNOSIS — Z79891 Long term (current) use of opiate analgesic: Secondary | ICD-10-CM | POA: Diagnosis not present

## 2021-12-19 ENCOUNTER — Other Ambulatory Visit: Payer: Self-pay | Admitting: Family Medicine

## 2021-12-19 DIAGNOSIS — E119 Type 2 diabetes mellitus without complications: Secondary | ICD-10-CM

## 2021-12-20 ENCOUNTER — Other Ambulatory Visit: Payer: Self-pay | Admitting: Family Medicine

## 2021-12-21 ENCOUNTER — Other Ambulatory Visit: Payer: Self-pay | Admitting: Family Medicine

## 2021-12-30 ENCOUNTER — Encounter: Payer: Self-pay | Admitting: *Deleted

## 2021-12-30 NOTE — Progress Notes (Signed)
[facility] Quality Team Note  Name: [name]. Date of Birth: [birth year] MRN: [id number] Date: 12/30/2021  [facility] Quality Team has reviewed this patient's chart, please see recommendations below:  [facility] Quality Other; (Pt has open gaps for AWV, diabetic retinopathy screening, blood pressure, and colonoscopy.  Tried to call pt to see if interested in [place] Event.  Had to leave a voice mail.  Pt has f/u visit on 01/21/2022.)

## 2022-01-17 ENCOUNTER — Telehealth: Payer: Self-pay | Admitting: Family Medicine

## 2022-01-17 NOTE — Telephone Encounter (Signed)
Left message for patient to call back and schedule Medicare Annual Wellness Visit (AWV) either virtually or in office. Left  my Herbie Drape number [phone]   AWV-I PER [facility]  12/23/18  please schedule with Nurse Health Adviser   45 min for awv-i and in office appointments 30 min for awv-s  phone/virtual appointments

## 2022-01-21 ENCOUNTER — Telehealth: Payer: Self-pay

## 2022-01-21 ENCOUNTER — Ambulatory Visit: Payer: PPO | Admitting: Family Medicine

## 2022-01-21 NOTE — Telephone Encounter (Signed)
Pt has open gaps for AWV, diabetic retinopathy screening, blood pressure, and colonoscopy.  Tried to call pt to see if interested in [place] Event.  Had to leave a voice mail.  Pt has f/u visit on 01/21/2022  Pt is scheduled for appt with PCP on 02/28/22.  LVM for pt to call back to determine if he wants to come in on 01/24/22 for diabetic retinopathy screen. If pt call back, please ask him this, confirm callback number & let Garnette Czech know.

## 2022-01-22 NOTE — Telephone Encounter (Signed)
Pt's spouse called back to say that while she appreciates the offer, she has to work, so she will not be available to drive Pt in on 42/3/95.

## 2022-01-27 ENCOUNTER — Other Ambulatory Visit: Payer: Self-pay

## 2022-01-27 DIAGNOSIS — E119 Type 2 diabetes mellitus without complications: Secondary | ICD-10-CM

## 2022-01-29 MED ORDER — NOVOLOG FLEXPEN 100 UNIT/ML ~~LOC~~ SOPN: PEN_INJECTOR | SUBCUTANEOUS | 2 refills | Status: DC

## 2022-02-03 ENCOUNTER — Telehealth: Payer: Self-pay | Admitting: Family Medicine

## 2022-02-03 NOTE — Telephone Encounter (Signed)
Seeking clarity on prescription insulin aspart (NOVOLOG FLEXPEN) 100 UNIT/ML FlexPen wants to know max dose

## 2022-02-03 NOTE — Telephone Encounter (Signed)
Patient is currently on sliding scale Novolog flexpen.    Pharmacy seeking clarification.   Please advise

## 2022-02-03 NOTE — Telephone Encounter (Signed)
Called [facility] spoke with Luisa Hart message given.

## 2022-02-28 ENCOUNTER — Ambulatory Visit (INDEPENDENT_AMBULATORY_CARE_PROVIDER_SITE_OTHER): Payer: PPO | Admitting: Family Medicine

## 2022-02-28 ENCOUNTER — Encounter: Payer: Self-pay | Admitting: Family Medicine

## 2022-02-28 VITALS — BP 134/72 | HR 71 | Temp 97.6°F | Ht 76.0 in | Wt 277.6 lb

## 2022-02-28 DIAGNOSIS — I10 Essential (primary) hypertension: Secondary | ICD-10-CM | POA: Diagnosis not present

## 2022-02-28 DIAGNOSIS — Z23 Encounter for immunization: Secondary | ICD-10-CM

## 2022-02-28 DIAGNOSIS — E785 Hyperlipidemia, unspecified: Secondary | ICD-10-CM

## 2022-02-28 DIAGNOSIS — E119 Type 2 diabetes mellitus without complications: Secondary | ICD-10-CM

## 2022-02-28 LAB — HEPATIC FUNCTION PANEL
ALT: 31 U/L (ref 0–53)
AST: 25 U/L (ref 0–37)
Albumin: 4.7 g/dL (ref 3.5–5.2)
Alkaline Phosphatase: 79 U/L (ref 39–117)
Bilirubin, Direct: 0.2 mg/dL (ref 0.0–0.3)
Total Bilirubin: 0.9 mg/dL (ref 0.2–1.2)
Total Protein: 7.3 g/dL (ref 6.0–8.3)

## 2022-02-28 LAB — LIPID PANEL
Cholesterol: 135 mg/dL (ref 0–200)
HDL: 49 mg/dL (ref >39.00)
LDL Cholesterol: 52 mg/dL (ref 0–99)
NonHDL: 85.99
Total CHOL/HDL Ratio: 3
Triglycerides: 169 mg/dL — ABNORMAL HIGH (ref 0.0–149.0)
VLDL: 33.8 mg/dL (ref 0.0–40.0)

## 2022-02-28 LAB — BASIC METABOLIC PANEL
BUN: 16 mg/dL (ref 6–23)
CO2: 31 mEq/L (ref 19–32)
Calcium: 9.7 mg/dL (ref 8.4–10.5)
Chloride: 97 mEq/L (ref 96–112)
Creatinine, Ser: 1.15 mg/dL (ref 0.40–1.50)
GFR: 65.11 mL/min (ref >60.00)
Glucose, Bld: 395 mg/dL — ABNORMAL HIGH (ref 70–99)
Potassium: 3.9 mEq/L (ref 3.5–5.1)
Sodium: 137 mEq/L (ref 135–145)

## 2022-02-28 LAB — POCT GLYCOSYLATED HEMOGLOBIN (HGB A1C): Hemoglobin A1C: 8.2 % — AB (ref 4.0–5.6)

## 2022-02-28 NOTE — Progress Notes (Signed)
Established Patient Office Visit  Subjective   Patient ID: Michael Gerke., male    DOB: May 10, 1950  Age: 71 y.o. MRN: [id number]  Chief Complaint  Patient presents with   Follow-up    HPI   [name] here for medical follow-up.  He has history of hypertension, fatty liver changes, type 2 diabetes, hyperlipidemia.  A1c's have not been tightly controlled.  Because of his pancreatitis history cannot take GLP-1 medications.  He has also had side effects previously with Jardiance with severe yeast balanitis.  Currently on Lantus 60 units once daily and takes NovoLog with meals up to 20 units 3 times daily.  He admits to recent poor compliance with diet.  We recently added back metformin but we have not seen any significant improvement in A1c's without.  We did discuss continuous glucose monitoring with freestyle libre or Dexcom but he declines at this time.  His blood pressure has been improved recently by several home readings.  Takes amlodipine 10 mg daily, HCTZ 12.5 mg daily, and lisinopril 40 mg daily.  Also on Toprol XL 100 mg once daily.  Denies any recent chest pains.  No dizziness.  Past Medical History:  Diagnosis Date   Arthritis    oa--and ra.  s/p right knee replacement on Jan 28, 2011--plans left knee replacement on 03/04/11.  also severe lower back pain-previous lumbar fusions, also pain in both hips, shoulders    Aspiration pneumonia (HCC)    Asthma    Diabetes mellitus    Gallstone pancreatitis    GERD (gastroesophageal reflux disease)    Hepatitis    pancreatitis 2010   Hyperlipidemia    Hypertension    eccho 6/10, EKG 1/11, clearence Dr Caryl Never from 11/28/10 on chart   Peripheral vascular disease (HCC)    states after arthroscopy left and right was given blood thinners- had symptoms of blood clot but wasnt diagnosed   Pneumonia    2010   Sleep apnea    study years ago/ no cpap/ states mild   Past Surgical History:  Procedure Laterality Date   BACK  SURGERY     hx of multiple back surgeries including lumbar fusion S1-L3   BIOPSY  12/04/2018   Procedure: BIOPSY;  Surgeon: Benancio Deeds, MD;  Location: [facility];  Service: Gastroenterology;;   CHOLECYSTECTOMY     COLON SURGERY     removal of part of small intestine 1979   ELBOW SURGERY     3 tendon repairs on right; 5 tendon repairs on left   ESOPHAGOGASTRODUODENOSCOPY (EGD) WITH PROPOFOL N/A 12/04/2018   Procedure: ESOPHAGOGASTRODUODENOSCOPY (EGD) WITH PROPOFOL;  Surgeon: Benancio Deeds, MD;  Location: [facility];  Service: Gastroenterology;  Laterality: N/A;   SHOULDER SURGERY     left x 2, right x 3   TOTAL KNEE ARTHROPLASTY  01/28/2011   Procedure: TOTAL KNEE ARTHROPLASTY;  Surgeon: Shelda Pal;  Location: [facility];  Service: Orthopedics;  Laterality: Right;  femoral nerve block preop.   TOTAL KNEE ARTHROPLASTY  03/04/2011   Procedure: TOTAL KNEE ARTHROPLASTY;  Surgeon: Shelda Pal;  Location: [facility];  Service: Orthopedics;  Laterality: Left;    reports that he has never smoked. He quit smokeless tobacco use about 31 years ago.  His smokeless tobacco use included snuff. He reports that he does not drink alcohol and does not use drugs. family history is not on file. Allergies  Allergen Reactions   Cortisone Itching    Hives and causes  blood sugar to elevate   Jardiance [Empagliflozin] Other (See Comments)    Yeast balanitis    Review of Systems  Constitutional:  Negative for malaise/fatigue.  Eyes:  Negative for blurred vision.  Respiratory:  Negative for shortness of breath.   Cardiovascular:  Negative for chest pain.  Neurological:  Negative for dizziness, weakness and headaches.      Objective:     BP 134/72 (BP Location: Left Arm, Patient Position: Sitting, Cuff Size: Large)   Pulse 71   Temp 97.6 F (36.4 C) (Oral)   Ht 6\' 4"  (1.93 m)   Wt 277 lb 9.6 oz (125.9 kg)   SpO2 100%   BMI 33.79 kg/m    Physical Exam Vitals reviewed.   Constitutional:      Appearance: He is well-developed.  Eyes:     Pupils: Pupils are equal, round, and reactive to light.  Neck:     Thyroid: No thyromegaly.  Cardiovascular:     Rate and Rhythm: Normal rate and regular rhythm.  Pulmonary:     Effort: Pulmonary effort is normal. No respiratory distress.     Breath sounds: Normal breath sounds. No wheezing or rales.  Musculoskeletal:     Cervical back: Neck supple.  Neurological:     Mental Status: He is alert and oriented to person, place, and time.      Results for orders placed or performed in visit on 02/28/22  POCT glycosylated hemoglobin (Hb A1C)  Result Value Ref Range   Hemoglobin A1C 8.2 (A) 4.0 - 5.6 %   HbA1c POC (<> result, manual entry)     HbA1c, POC (prediabetic range)     HbA1c, POC (controlled diabetic range)        The 10-year ASCVD risk score (Arnett DK, et al., 2019) is: 35%    Assessment & Plan:   #1 type 2 diabetes.  Suboptimal control with A1c 8.2%.  Cannot take GLP-1 medications because of pancreatitis history and has had intolerance with Jardiance previously and reluctant to try any SGLT2 medications.  We did mention the fact that we could increase his metformin but he had sensitivity with GI issues in the past.  We mention further titration of NovoLog.  Would need more consistent home readings to help guide titration of that.  We strongly suggest that he consider continuous glucose monitoring but he declines.  He will let me know if he reconsiders.  -He is requesting 3 months of more diligence with diet and reassessing.  #2 hypertension-stable and improved.  Continue combination therapy with lisinopril, amlodipine, metoprolol, and HCTZ. Could consider chlorthalidone in future if blood pressure not optimally controlled which will be a little more potent than HCTZ  #3 dyslipidemia.  Recent LDL cholesterol at goal on Lipitor 20 mg daily.  Flu vaccine given  Set up 5-month follow-up   Return in  about 3 months (around 05/30/2022).    07/30/2022, MD

## 2022-02-28 NOTE — Patient Instructions (Signed)
Check blood sugars regularly several times daily and record and bring for follow up.

## 2022-03-19 ENCOUNTER — Other Ambulatory Visit: Payer: Self-pay | Admitting: Family Medicine

## 2022-03-20 ENCOUNTER — Telehealth: Payer: Self-pay | Admitting: Family Medicine

## 2022-03-20 NOTE — Telephone Encounter (Signed)
Pt wife is calling and would like last blood work results to be mail to home address

## 2022-03-21 NOTE — Telephone Encounter (Signed)
Mailed to home address as requested

## 2022-04-04 ENCOUNTER — Other Ambulatory Visit: Payer: Self-pay | Admitting: Family Medicine

## 2022-04-04 DIAGNOSIS — E119 Type 2 diabetes mellitus without complications: Secondary | ICD-10-CM

## 2022-04-25 DIAGNOSIS — Z79891 Long term (current) use of opiate analgesic: Secondary | ICD-10-CM | POA: Diagnosis not present

## 2022-04-25 DIAGNOSIS — M961 Postlaminectomy syndrome, not elsewhere classified: Secondary | ICD-10-CM | POA: Diagnosis not present

## 2022-04-25 DIAGNOSIS — E1142 Type 2 diabetes mellitus with diabetic polyneuropathy: Secondary | ICD-10-CM | POA: Diagnosis not present

## 2022-04-25 DIAGNOSIS — G894 Chronic pain syndrome: Secondary | ICD-10-CM | POA: Diagnosis not present

## 2022-05-09 ENCOUNTER — Telehealth: Payer: Self-pay | Admitting: Family Medicine

## 2022-05-09 NOTE — Telephone Encounter (Signed)
Left message for patient to call back and schedule Medicare Annual Wellness Visit (AWV) either virtually or in office. Left  my [name] number [phone]   AWV-I PER [place]  12/23/18 please schedule with Nurse Health Adviser  30 min for all AWV appointments

## 2022-05-30 ENCOUNTER — Ambulatory Visit: Payer: HMO | Admitting: Family Medicine

## 2022-06-01 ENCOUNTER — Other Ambulatory Visit: Payer: Self-pay | Admitting: Family Medicine

## 2022-06-15 ENCOUNTER — Other Ambulatory Visit: Payer: Self-pay | Admitting: Family Medicine

## 2022-06-25 ENCOUNTER — Telehealth: Payer: Self-pay | Admitting: Family Medicine

## 2022-06-25 NOTE — Telephone Encounter (Signed)
Prescription Request  06/25/2022  LOV: 02/28/2022  What is the name of the medication or equipment? Lisinopril 40 MG  Have you contacted your pharmacy to request a refill? Yes   Which pharmacy would you like this sent to? [facility] [place] [address] Phone: [phone] Fax: [phone]     Patient notified that their request is being sent to the clinical staff for review and that they should receive a response within 2 business days.   Please advise at Mobile [phone] (mobile)

## 2022-06-26 MED ORDER — LISINOPRIL 40 MG PO TABS: 40.0000 mg | ORAL_TABLET | Freq: Every day | ORAL | 0 refills | Status: DC

## 2022-06-26 NOTE — Telephone Encounter (Signed)
Rx sent 

## 2022-06-26 NOTE — Telephone Encounter (Signed)
Noted pharmacy removed from list

## 2022-06-26 NOTE — Telephone Encounter (Signed)
Spouse called to say this Rx was sent to the wrong pharmacy ([facility])   Spouse stated she will go [facility], but asked that that pharmacy be deleted from Pt's pharmacy list, so that this does not happen again.  Spouses's request was granted.

## 2022-06-30 ENCOUNTER — Telehealth: Payer: Self-pay

## 2022-06-30 NOTE — Progress Notes (Signed)
Care Management & Coordination Services Pharmacy Team  Reason for Encounter: General adherence update   Contacted patient for general health update and medication adherence call.  {US HC Outreach:28874}   What concerns do you have about your medications?  The patient {denies/reports:25180} side effects with their medications.   How often do you forget or accidentally miss a dose? {missed doses:25554}  Do you use a pillbox? {yes/no:20286}  Are you having any problems getting your medications from your pharmacy? {yes/no:20286}  Has the cost of your medications been a concern? {yes/no:20286} If yes, what medication and is patient assistance available or has it been applied for?  Since last visit with PharmD, {no/thefollowing:25210} interventions have been made.   The patient {has/has not:25209} had an ED visit since last contact.   The patient {denies/reports:25180} problems with their health.   Patient {denies/reports:25180} concerns or questions for ***, PharmD at this time.   Counseled patient on: {GENERALCOUNSELING:28686}   Chart Updates:  Recent office visits:  02/28/22 Michael Covey, MD - Patient presented for Type 2 diabetes without complication and other concerns. No medication changes.   Recent consult visits:  None   Hospital visits:  None in previous 6 months  Medications: Outpatient Encounter Medications as of 06/30/2022  Medication Sig   amLODipine (NORVASC) 10 MG tablet TAKE 1 TABLET BY MOUTH EVERY DAY   atorvastatin (LIPITOR) 20 MG tablet TAKE 1 TABLET BY MOUTH EVERY DAY   Azelastine HCl 137 MCG/SPRAY SOLN USE 2 SPRAYS IN EACH NOSTRIL TWICE DAILY FOR ALLERGIES   Blood Glucose Monitoring Suppl (ONE TOUCH ULTRA 2) w/Device KIT Use as directed to check blood glucose two times daily. Dx Code E11.65   docusate sodium (COLACE) 100 MG capsule Take 100 mg by mouth 3 (three) times daily as needed. Constipation    fexofenadine-pseudoephedrine (ALLEGRA-D 12  HOUR) 60-120 MG per tablet Take 1 tablet by mouth 2 (two) times daily.   glucose blood (ONETOUCH ULTRA) test strip Use as instructed to test blood glucose two times daily. Dx Code E11.65   hydrochlorothiazide (MICROZIDE) 12.5 MG capsule Take 1 capsule (12.5 mg total) by mouth daily.   insulin aspart (NOVOLOG FLEXPEN) 100 UNIT/ML FlexPen INJECT INTO THE SKIN THREE TIMES DAILY BEFORE MEALS AS DIRECTED PER SLIDING SCALE MAX DAILY DOSE OF 42 UNITS   insulin glargine (LANTUS SOLOSTAR) 100 UNIT/ML Solostar Pen INJECT 60 UNITS UNDER THE SKIN TWICE DAILY   Insulin Pen Needle (B-D UF III MINI PEN NEEDLES) 31G X 5 MM MISC USE TWICE A DAY WITH INSULIN PEN   Insulin Pen Needle 31G X 5 MM MISC Use as directed with Basaglar Kwikpen   Lancets (ONETOUCH ULTRASOFT) lancets Use as instructed to check blood glucose two times daily. Dx Code E11.65   lisinopril (ZESTRIL) 40 MG tablet Take 1 tablet (40 mg total) by mouth daily.   metFORMIN (GLUCOPHAGE) 500 MG tablet TAKE 1 TABLET BY MOUTH TWICE A DAY WITH MEALS   methocarbamol (ROBAXIN) 500 MG tablet Take 500 mg by mouth every 8 (eight) hours as needed.   metoprolol succinate (TOPROL-XL) 100 MG 24 hr tablet TAKE 1 TABLET BY MOUTH EVERY DAY IN THE MORNING   pantoprazole (PROTONIX) 40 MG tablet TAKE 1 TABLET BY MOUTH EVERY DAY   polyethylene glycol (MIRALAX / GLYCOLAX) packet Take 17 g by mouth daily as needed. Constipation    No facility-administered encounter medications on file as of 06/30/2022.    Recent vitals BP Readings from Last 3 Encounters:  02/28/22 134/72  10/21/21 (!) 152/78  07/22/21 (!) 160/80   Pulse Readings from Last 3 Encounters:  02/28/22 71  10/21/21 75  07/22/21 81   Wt Readings from Last 3 Encounters:  02/28/22 277 lb 9.6 oz (125.9 kg)  10/21/21 281 lb 1.6 oz (127.5 kg)  07/22/21 287 lb 8 oz (130.4 kg)   BMI Readings from Last 3 Encounters:  02/28/22 33.79 kg/m  10/21/21 34.22 kg/m  07/22/21 35.00 kg/m    Recent lab results     Component Value Date/Time   NA 137 02/28/2022 0842   K 3.9 02/28/2022 0842   CL 97 02/28/2022 0842   CO2 31 02/28/2022 0842   GLUCOSE 395 (H) 02/28/2022 0842   BUN 16 02/28/2022 0842   CREATININE 1.15 02/28/2022 0842   CALCIUM 9.7 02/28/2022 0842    Lab Results  Component Value Date   CREATININE 1.15 02/28/2022   GFR 65.11 02/28/2022   GFRNONAA >60 12/05/2018   GFRAA >60 12/05/2018   Lab Results  Component Value Date/Time   HGBA1C 8.2 (A) 02/28/2022 08:02 AM   HGBA1C 7.8 (A) 10/21/2021 08:08 AM   HGBA1C 7.8 02/03/2020 07:40 AM   HGBA1C 7.8 (A) 02/03/2020 07:40 AM   HGBA1C 7.8 (A) 02/03/2020 07:40 AM   HGBA1C 9.2 (H) 12/04/2018 01:04 AM   HGBA1C 9.4 (H) 12/03/2018 01:24 PM   MICROALBUR 0.4 06/14/2009 08:27 AM    Lab Results  Component Value Date   CHOL 135 02/28/2022   HDL 49.00 02/28/2022   LDLCALC 52 02/28/2022   LDLDIRECT 102.0 01/27/2018   TRIG 169.0 (H) 02/28/2022   CHOLHDL 3 02/28/2022    Care Gaps: AWV - Overdue Zoster Vaccine - Overdue Colonoscopy - Overdue Diabetic Urine - Overdue Eye Exam - Overdue Foot Exam - Overdue COVID Booster - Overdue  Star Rating Drugs: *** Atorvastatin 20 mg - Last filled     Pamala Duffel CMA Clinical Pharmacist Assistant [phone]

## 2022-07-02 ENCOUNTER — Telehealth: Payer: Self-pay | Admitting: Family Medicine

## 2022-07-02 ENCOUNTER — Ambulatory Visit: Payer: HMO | Admitting: Family Medicine

## 2022-07-02 NOTE — Telephone Encounter (Signed)
Called patient to schedule Medicare Annual Wellness Visit (AWV). Unable to reach patient.  Last date of AWV:  AWV-I PER PA*LMETTO  12/23/18  Please schedule an appointment at any time with NHA Nickeah or hannah kim.  If any questions, please contact me at [phone].  Thank you ,  Rudell Cobb AWV direct phone # [phone]   Several message has been left *LM 07/02/22 05/09/22  **SCHEDULED w/pcp 02/28/22  *LM 01/17/22  12/04/21  11/13/21   10/14/21   09/02/21   08/09/21 06/17/21   11/08/20   10/10/20   08/16/20   05/18/20, 03/15/20; AWV-I PER PA*LMETTO  12/23/18  appt 10/02/20

## 2022-07-03 ENCOUNTER — Telehealth: Payer: Self-pay | Admitting: Family Medicine

## 2022-07-03 MED ORDER — METOPROLOL SUCCINATE ER 100 MG PO TB24: ORAL_TABLET | ORAL | 0 refills | Status: DC

## 2022-07-03 MED ORDER — AMLODIPINE BESYLATE 10 MG PO TABS: 10.0000 mg | ORAL_TABLET | Freq: Every day | ORAL | 0 refills | Status: DC

## 2022-07-03 NOTE — Telephone Encounter (Signed)
Rx sent 

## 2022-07-03 NOTE — Telephone Encounter (Signed)
Prescription Request  07/03/2022  LOV: 02/28/2022  What is the name of the medication or equipment? amLODipine (NORVASC) 10 MG tablet , metoprolol succinate (TOPROL-XL) 100 MG 24 hr tablet . Pt has new pharm  Have you contacted your pharmacy to request a refill? No   Which pharmacy would you like this sent to?  [id number] [place], [place] - [address] AT [facility] [address] [facility] [address] & [facility] [address] [place] [address] Phone: [phone] Fax: [phone]    Patient notified that their request is being sent to the clinical staff for review and that they should receive a response within 2 business days.   Please advise at Mobile [phone] (mobile)

## 2022-07-05 ENCOUNTER — Other Ambulatory Visit: Payer: Self-pay | Admitting: Family Medicine

## 2022-07-05 DIAGNOSIS — E119 Type 2 diabetes mellitus without complications: Secondary | ICD-10-CM

## 2022-07-15 ENCOUNTER — Other Ambulatory Visit: Payer: Self-pay | Admitting: Family Medicine

## 2022-08-05 ENCOUNTER — Telehealth: Payer: Self-pay | Admitting: Family Medicine

## 2022-08-05 NOTE — Telephone Encounter (Signed)
Called patient to schedule Medicare Annual Wellness Visit (AWV). Left message for patient to call back and schedule Medicare Annual Wellness Visit (AWV).  Last date of AWV: WV-I PER PA*LMETTO 12/23/18   Please schedule an appointment at any time with NHA beverly or hannah kim.  If any questions, please contact me at [phone].  Thank you ,  Rudell Cobb AWV direct phone # [phone]

## 2022-08-08 ENCOUNTER — Other Ambulatory Visit: Payer: Self-pay | Admitting: Family Medicine

## 2022-08-08 DIAGNOSIS — E119 Type 2 diabetes mellitus without complications: Secondary | ICD-10-CM

## 2022-08-19 DIAGNOSIS — M961 Postlaminectomy syndrome, not elsewhere classified: Secondary | ICD-10-CM | POA: Diagnosis not present

## 2022-08-19 DIAGNOSIS — Z79891 Long term (current) use of opiate analgesic: Secondary | ICD-10-CM | POA: Diagnosis not present

## 2022-08-19 DIAGNOSIS — E1142 Type 2 diabetes mellitus with diabetic polyneuropathy: Secondary | ICD-10-CM | POA: Diagnosis not present

## 2022-08-19 DIAGNOSIS — G894 Chronic pain syndrome: Secondary | ICD-10-CM | POA: Diagnosis not present

## 2022-08-27 ENCOUNTER — Telehealth: Payer: Self-pay

## 2022-08-27 ENCOUNTER — Other Ambulatory Visit: Payer: Self-pay | Admitting: Family Medicine

## 2022-08-27 NOTE — Progress Notes (Deleted)
Care Management & Coordination Services Pharmacy Team  Reason for Encounter: General adherence update   Contacted patient for general health update and medication adherence call.  {US HC Outreach:28874}   What concerns do you have about your medications?  The patient {denies/reports:25180} side effects with their medications.   How often do you forget or accidentally miss a dose? {missed doses:25554}  Do you use a pillbox? {yes/no:20286}  Are you having any problems getting your medications from your pharmacy? {yes/no:20286}  Has the cost of your medications been a concern? {yes/no:20286} If yes, what medication and is patient assistance available or has it been applied for?  Since last visit with PharmD, {no/thefollowing:25210} interventions have been made.   The patient {has/has not:25209} had an ED visit since last contact.   The patient {denies/reports:25180} problems with their health.   Patient {denies/reports:25180} concerns or questions for ***, PharmD at this time.   Counseled patient on: {GENERALCOUNSELING:28686}   Chart Updates:  Recent office visits:  None  Recent consult visits:  None  Hospital visits:  None in previous 6 months  Medications: Outpatient Encounter Medications as of 08/27/2022  Medication Sig   amLODipine (NORVASC) 10 MG tablet Take 1 tablet (10 mg total) by mouth daily.   atorvastatin (LIPITOR) 20 MG tablet TAKE 1 TABLET BY MOUTH EVERY DAY   Azelastine HCl 137 MCG/SPRAY SOLN USE 2 SPRAYS IN EACH NOSTRIL TWICE DAILY FOR ALLERGIES   Blood Glucose Monitoring Suppl (ONE TOUCH ULTRA 2) w/Device KIT Use as directed to check blood glucose two times daily. Dx Code E11.65   docusate sodium (COLACE) 100 MG capsule Take 100 mg by mouth 3 (three) times daily as needed. Constipation    fexofenadine-pseudoephedrine (ALLEGRA-D 12 HOUR) 60-120 MG per tablet Take 1 tablet by mouth 2 (two) times daily.   glucose blood (ONETOUCH ULTRA) test strip Use as  instructed to test blood glucose two times daily. Dx Code E11.65   hydrochlorothiazide (MICROZIDE) 12.5 MG capsule TAKE 1 CAPSULE BY MOUTH EVERY DAY   Insulin Aspart FlexPen (NOVOLOG) 100 UNIT/ML INJECT UNDER THE SKIN THREE TIMES DAILY BEFORE MEALS AS DIRECTED PER SLIDING SCALE. MAXIMUM DAILY DOSE IS 42 UNITS   insulin glargine (LANTUS SOLOSTAR) 100 UNIT/ML Solostar Pen INJECT 60 UNITS UNDER THE SKIN TWICE DAILY   Insulin Pen Needle (B-D UF III MINI PEN NEEDLES) 31G X 5 MM MISC USE TWICE A DAY WITH INSULIN PEN   Insulin Pen Needle 31G X 5 MM MISC Use as directed with Basaglar Kwikpen   Lancets (ONETOUCH ULTRASOFT) lancets Use as instructed to check blood glucose two times daily. Dx Code E11.65   lisinopril (ZESTRIL) 40 MG tablet Take 1 tablet (40 mg total) by mouth daily.   metFORMIN (GLUCOPHAGE) 500 MG tablet TAKE 1 TABLET BY MOUTH TWICE DAILY WITH MEALS   methocarbamol (ROBAXIN) 500 MG tablet Take 500 mg by mouth every 8 (eight) hours as needed.   metoprolol succinate (TOPROL-XL) 100 MG 24 hr tablet TAKE 1 TABLET BY MOUTH EVERY DAY IN THE MORNING   pantoprazole (PROTONIX) 40 MG tablet TAKE 1 TABLET BY MOUTH EVERY DAY   polyethylene glycol (MIRALAX / GLYCOLAX) packet Take 17 g by mouth daily as needed. Constipation    No facility-administered encounter medications on file as of 08/27/2022.    Recent vitals BP Readings from Last 3 Encounters:  02/28/22 134/72  10/21/21 (!) 152/78  07/22/21 (!) 160/80   Pulse Readings from Last 3 Encounters:  02/28/22 71  10/21/21 75  07/22/21  81   Wt Readings from Last 3 Encounters:  02/28/22 277 lb 9.6 oz (125.9 kg)  10/21/21 281 lb 1.6 oz (127.5 kg)  07/22/21 287 lb 8 oz (130.4 kg)   BMI Readings from Last 3 Encounters:  02/28/22 33.79 kg/m  10/21/21 34.22 kg/m  07/22/21 35.00 kg/m    Recent lab results    Component Value Date/Time   NA 137 02/28/2022 0842   K 3.9 02/28/2022 0842   CL 97 02/28/2022 0842   CO2 31 02/28/2022 0842    GLUCOSE 395 (H) 02/28/2022 0842   BUN 16 02/28/2022 0842   CREATININE 1.15 02/28/2022 0842   CALCIUM 9.7 02/28/2022 0842    Lab Results  Component Value Date   CREATININE 1.15 02/28/2022   GFR 65.11 02/28/2022   GFRNONAA >60 12/05/2018   GFRAA >60 12/05/2018   Lab Results  Component Value Date/Time   HGBA1C 8.2 (A) 02/28/2022 08:02 AM   HGBA1C 7.8 (A) 10/21/2021 08:08 AM   HGBA1C 7.8 02/03/2020 07:40 AM   HGBA1C 7.8 (A) 02/03/2020 07:40 AM   HGBA1C 7.8 (A) 02/03/2020 07:40 AM   HGBA1C 9.2 (H) 12/04/2018 01:04 AM   HGBA1C 9.4 (H) 12/03/2018 01:24 PM   MICROALBUR 0.4 06/14/2009 08:27 AM    Lab Results  Component Value Date   CHOL 135 02/28/2022   HDL 49.00 02/28/2022   LDLCALC 52 02/28/2022   LDLDIRECT 102.0 01/27/2018   TRIG 169.0 (H) 02/28/2022   CHOLHDL 3 02/28/2022    Care Gaps: AWV - Overdue Zoster Vaccine - Overdue Colonoscopy - Overdue Diabetic Urine - Overdue Eye Exam - Overdue Foot Exam - Overdue COVID Booster - Overdue   Star Rating Drugs: *** Atorvastatin 20 mg - Last filled 07/09/22 90 DS [facility] Lisinopril 40 mg [facility] (Verified) Metformin 500 mg Last filled 05/13/22 90 DS at [facility]      Pamala Duffel CMA Clinical Pharmacist Assistant [phone]

## 2022-08-29 ENCOUNTER — Ambulatory Visit: Payer: PPO | Admitting: Family Medicine

## 2022-09-02 ENCOUNTER — Encounter: Payer: Self-pay | Admitting: *Deleted

## 2022-09-02 NOTE — Progress Notes (Signed)
[facility] Quality Team Note  Name: [name]. Date of Birth: [birth year] MRN: [id number] Date: 09/02/2022  [facility] Quality Team has reviewed this patient's chart, please see recommendations below:  [facility] Quality Other; We have opportunity to close a few gaps on pt if he is agreeable.  He has open gaps for the following: AWV, blood pressure, colon screening, diabetic eye screening-multiple voice mails have been left for pt (no response yet), and A1C.  Pt has ov 09/12/22.  Would provider mind addressing with pt?

## 2022-09-12 ENCOUNTER — Ambulatory Visit (INDEPENDENT_AMBULATORY_CARE_PROVIDER_SITE_OTHER): Payer: PPO | Admitting: Family Medicine

## 2022-09-12 VITALS — BP 140/68 | HR 70 | Temp 97.7°F | Ht 76.0 in | Wt 272.7 lb

## 2022-09-12 DIAGNOSIS — Z794 Long term (current) use of insulin: Secondary | ICD-10-CM

## 2022-09-12 DIAGNOSIS — E1165 Type 2 diabetes mellitus with hyperglycemia: Secondary | ICD-10-CM

## 2022-09-12 DIAGNOSIS — I10 Essential (primary) hypertension: Secondary | ICD-10-CM | POA: Diagnosis not present

## 2022-09-12 LAB — POCT GLYCOSYLATED HEMOGLOBIN (HGB A1C): Hemoglobin A1C: 9 % — AB (ref 4.0–5.6)

## 2022-09-12 MED ORDER — INSULIN PEN NEEDLE 31G X 5 MM MISC: 1 refills | Status: AC

## 2022-09-12 MED ORDER — BD PEN NEEDLE MINI U/F 31G X 5 MM MISC: 0 refills | Status: DC

## 2022-09-12 MED ORDER — SPIRONOLACTONE 25 MG PO TABS
25.0000 mg | ORAL_TABLET | Freq: Every day | ORAL | 5 refills | Status: DC
Start: 2022-09-12 — End: 2023-03-09

## 2022-09-12 NOTE — Patient Instructions (Signed)
STOP the Microzide.  Start the Aldactone one daily  Set up one month follow up.    Try to lose some weight  I will be setting up endocrinology follow up

## 2022-09-12 NOTE — Progress Notes (Signed)
Established Patient Office Visit  Subjective   Patient ID: Michael Dupre., male    DOB: 1950-12-30  Age: 72 y.o. MRN: [id number]  Chief Complaint  Patient presents with   Medical Management of Chronic Issues    HPI   [name] is seen for medical follow-up.  He has longstanding history of type 2 diabetes.  He is currently on a regimen of metformin and high-dose Lantus with 60 units twice daily and takes NovoLog by sliding scale usually around 45 units total per day.  Last A1c was elevated 8.2%.  He states his fastings have consistently been below 120 recently.  Not consistently checking postprandials.  Does not bring in a log of sugars for review today.  He has chronic back pain which limits his activities greatly.  He had previous pancreatitis and so was not a candidate for GLP-1 medication.  He had severe yeast balanitis with Jardiance previously.  He is willing to consider possible endocrine referral at this time.  He has hypertension currently treated with HCTZ 12.5 mg daily, amlodipine 10 mg daily, metoprolol succinate 100 mg daily, and lisinopril 40 mg daily.  Compliant with therapy.  Does not eat out very often.  No recent increase in peripheral edema.  No headaches or dizziness.  Hyperlipidemia treated with atorvastatin 20 mg daily.  Past Medical History:  Diagnosis Date   Arthritis    oa--and ra.  s/p right knee replacement on Jan 28, 2011--plans left knee replacement on 03/04/11.  also severe lower back pain-previous lumbar fusions, also pain in both hips, shoulders    Aspiration pneumonia (HCC)    Asthma    Diabetes mellitus    Gallstone pancreatitis    GERD (gastroesophageal reflux disease)    Hepatitis    pancreatitis 2010   Hyperlipidemia    Hypertension    eccho 6/10, EKG 1/11, clearence Dr Caryl Never from 11/28/10 on chart   Peripheral vascular disease (HCC)    states after arthroscopy left and right was given blood thinners- had symptoms of blood clot but  wasnt diagnosed   Pneumonia    2010   Sleep apnea    study years ago/ no cpap/ states mild   Past Surgical History:  Procedure Laterality Date   BACK SURGERY     hx of multiple back surgeries including lumbar fusion S1-L3   BIOPSY  12/04/2018   Procedure: BIOPSY;  Surgeon: Benancio Deeds, MD;  Location: [facility];  Service: Gastroenterology;;   CHOLECYSTECTOMY     COLON SURGERY     removal of part of small intestine 1979   ELBOW SURGERY     3 tendon repairs on right; 5 tendon repairs on left   ESOPHAGOGASTRODUODENOSCOPY (EGD) WITH PROPOFOL N/A 12/04/2018   Procedure: ESOPHAGOGASTRODUODENOSCOPY (EGD) WITH PROPOFOL;  Surgeon: Benancio Deeds, MD;  Location: [facility];  Service: Gastroenterology;  Laterality: N/A;   SHOULDER SURGERY     left x 2, right x 3   TOTAL KNEE ARTHROPLASTY  01/28/2011   Procedure: TOTAL KNEE ARTHROPLASTY;  Surgeon: Shelda Pal;  Location: [facility];  Service: Orthopedics;  Laterality: Right;  femoral nerve block preop.   TOTAL KNEE ARTHROPLASTY  03/04/2011   Procedure: TOTAL KNEE ARTHROPLASTY;  Surgeon: Shelda Pal;  Location: [facility];  Service: Orthopedics;  Laterality: Left;    reports that he has never smoked. He quit smokeless tobacco use about 31 years ago.  His smokeless tobacco use included snuff. He reports that he does not  drink alcohol and does not use drugs. family history is not on file. Allergies  Allergen Reactions   Cortisone Itching    Hives and causes blood sugar to elevate   Jardiance [Empagliflozin] Other (See Comments)    Yeast balanitis    Review of Systems  Constitutional:  Negative for malaise/fatigue and weight loss.  Eyes:  Negative for blurred vision.  Respiratory:  Negative for shortness of breath.   Cardiovascular:  Negative for chest pain.  Neurological:  Negative for dizziness, weakness and headaches.      Objective:     BP (!) 140/68 (BP Location: Left Arm, Cuff Size: Large)   Pulse 70   Temp 97.7  F (36.5 C) (Oral)   Ht 6\' 4"  (1.93 m)   Wt 272 lb 11.2 oz (123.7 kg)   SpO2 99%   BMI 33.19 kg/m  BP Readings from Last 3 Encounters:  09/12/22 (!) 140/68  02/28/22 134/72  10/21/21 (!) 152/78   Wt Readings from Last 3 Encounters:  09/12/22 272 lb 11.2 oz (123.7 kg)  02/28/22 277 lb 9.6 oz (125.9 kg)  10/21/21 281 lb 1.6 oz (127.5 kg)      Physical Exam Vitals reviewed.  Constitutional:      Appearance: He is well-developed.  Eyes:     Pupils: Pupils are equal, round, and reactive to light.  Neck:     Thyroid: No thyromegaly.  Cardiovascular:     Rate and Rhythm: Normal rate and regular rhythm.  Pulmonary:     Effort: Pulmonary effort is normal. No respiratory distress.     Breath sounds: Normal breath sounds. No wheezing or rales.  Musculoskeletal:     Cervical back: Neck supple.     Right lower leg: No edema.     Left lower leg: No edema.  Neurological:     Mental Status: He is alert and oriented to person, place, and time.      Results for orders placed or performed in visit on 09/12/22  POC HgB A1c  Result Value Ref Range   Hemoglobin A1C 9.0 (A) 4.0 - 5.6 %   HbA1c POC (<> result, manual entry)     HbA1c, POC (prediabetic range)     HbA1c, POC (controlled diabetic range)      Last CBC Lab Results  Component Value Date   WBC 13.4 (H) 12/04/2018   HGB 12.6 (L) 12/04/2018   HCT 36.2 (L) 12/04/2018   MCV 86.4 12/04/2018   MCH 30.1 12/04/2018   RDW 11.5 12/04/2018   PLT 404 (H) 12/04/2018   Last metabolic panel Lab Results  Component Value Date   GLUCOSE 395 (H) 02/28/2022   NA 137 02/28/2022   K 3.9 02/28/2022   CL 97 02/28/2022   CO2 31 02/28/2022   BUN 16 02/28/2022   CREATININE 1.15 02/28/2022   GFRNONAA >60 12/05/2018   CALCIUM 9.7 02/28/2022   PHOS 4.2 09/07/2008   PROT 7.3 02/28/2022   ALBUMIN 4.7 02/28/2022   BILITOT 0.9 02/28/2022   ALKPHOS 79 02/28/2022   AST 25 02/28/2022   ALT 31 02/28/2022   ANIONGAP 14 12/05/2018   Last  lipids Lab Results  Component Value Date   CHOL 135 02/28/2022   HDL 49.00 02/28/2022   LDLCALC 52 02/28/2022   LDLDIRECT 102.0 01/27/2018   TRIG 169.0 (H) 02/28/2022   CHOLHDL 3 02/28/2022   Last hemoglobin A1c Lab Results  Component Value Date   HGBA1C 9.0 (A) 09/12/2022  The 10-year ASCVD risk score (Arnett DK, et al., 2019) is: 32.8%    Assessment & Plan:   #1 type 2 diabetes poorly controlled with A1c 9.0%.  This has been steadily and gradually worsening.  He is already on high-dose insulin therapy.  Cannot take GLP-1 with prior pancreatitis.  Previous intolerance with SGLT2 medication with yeast balanitis.  We recommended endocrinology referral and patient agrees.  This will be set up.  Recommend close monitoring of sugars and try to keep a log to help with further guidance and direction for insulin therapy  #2 hypertension poorly controlled.  Patient already on 4 drug regimen including lisinopril, HCTZ, amlodipine, metoprolol.  Will DC Microzide and start Aldactone 25 mg daily.  Cautioned not to take any potassium supplement.  Recheck in 1 month for blood pressure follow-up and check basic metabolic panel then.  May have to consider addition of additiona medication such as hydralazine at follow-up if not improving.  Also stressed importance of low-sodium diet and weight control.  Handout on DASH diet given.   Return in about 1 month (around 10/12/2022).    Evelena Peat, MD

## 2022-09-29 ENCOUNTER — Other Ambulatory Visit: Payer: Self-pay | Admitting: Family Medicine

## 2022-10-10 ENCOUNTER — Encounter: Payer: Self-pay | Admitting: Family Medicine

## 2022-10-10 ENCOUNTER — Ambulatory Visit (INDEPENDENT_AMBULATORY_CARE_PROVIDER_SITE_OTHER): Payer: PPO | Admitting: Family Medicine

## 2022-10-10 VITALS — BP 142/70 | HR 76 | Temp 97.9°F | Ht 76.0 in | Wt 274.0 lb

## 2022-10-10 DIAGNOSIS — Z794 Long term (current) use of insulin: Secondary | ICD-10-CM | POA: Diagnosis not present

## 2022-10-10 DIAGNOSIS — I10 Essential (primary) hypertension: Secondary | ICD-10-CM | POA: Diagnosis not present

## 2022-10-10 DIAGNOSIS — E1165 Type 2 diabetes mellitus with hyperglycemia: Secondary | ICD-10-CM

## 2022-10-10 DIAGNOSIS — R42 Dizziness and giddiness: Secondary | ICD-10-CM | POA: Diagnosis not present

## 2022-10-10 NOTE — Progress Notes (Signed)
Established Patient Office Visit  Subjective   Patient ID: Michael Boschert., male    DOB: April 01, 1950  Age: 72 y.o. MRN: [id number]  No chief complaint on file.   HPI   [name] is here for medical follow up  Hypertension.  Up last visit and added Aldactone (d/ced hydrochlorothiazide and changed to Aldactone).   Feels some better since then.    Diabetes poorly controlled and we recommended Endocrine referral. He states very poor diet complaince recently and he prefers 3 months of better diet, weight loss , and recheck before referral.  Had couple of siblings pass recently and felt his coping with grief involved eating.  Recent Vertigo- has had in past.  Bidirectional.  Occasional nausea.  No headache.   Using Meclizine OTC with some relief.   Past Medical History:  Diagnosis Date   Arthritis    oa--and ra.  s/p right knee replacement on Jan 28, 2011--plans left knee replacement on 03/04/11.  also severe lower back pain-previous lumbar fusions, also pain in both hips, shoulders    Aspiration pneumonia (HCC)    Asthma    Diabetes mellitus    Gallstone pancreatitis    GERD (gastroesophageal reflux disease)    Hepatitis    pancreatitis 2010   Hyperlipidemia    Hypertension    eccho 6/10, EKG 1/11, clearence Dr Caryl Never from 11/28/10 on chart   Peripheral vascular disease (HCC)    states after arthroscopy left and right was given blood thinners- had symptoms of blood clot but wasnt diagnosed   Pneumonia    2010   Sleep apnea    study years ago/ no cpap/ states mild   Past Surgical History:  Procedure Laterality Date   BACK SURGERY     hx of multiple back surgeries including lumbar fusion S1-L3   BIOPSY  12/04/2018   Procedure: BIOPSY;  Surgeon: Benancio Deeds, MD;  Location: [facility];  Service: Gastroenterology;;   CHOLECYSTECTOMY     COLON SURGERY     removal of part of small intestine 1979   ELBOW SURGERY     3 tendon repairs on right; 5 tendon repairs  on left   ESOPHAGOGASTRODUODENOSCOPY (EGD) WITH PROPOFOL N/A 12/04/2018   Procedure: ESOPHAGOGASTRODUODENOSCOPY (EGD) WITH PROPOFOL;  Surgeon: Benancio Deeds, MD;  Location: [facility];  Service: Gastroenterology;  Laterality: N/A;   SHOULDER SURGERY     left x 2, right x 3   TOTAL KNEE ARTHROPLASTY  01/28/2011   Procedure: TOTAL KNEE ARTHROPLASTY;  Surgeon: Shelda Pal;  Location: [facility];  Service: Orthopedics;  Laterality: Right;  femoral nerve block preop.   TOTAL KNEE ARTHROPLASTY  03/04/2011   Procedure: TOTAL KNEE ARTHROPLASTY;  Surgeon: Shelda Pal;  Location: [facility];  Service: Orthopedics;  Laterality: Left;    reports that he has never smoked. He quit smokeless tobacco use about 31 years ago.  His smokeless tobacco use included snuff. He reports that he does not drink alcohol and does not use drugs. family history is not on file. Allergies  Allergen Reactions   Cortisone Itching    Hives and causes blood sugar to elevate   Jardiance [Empagliflozin] Other (See Comments)    Yeast balanitis      Review of Systems  Constitutional:  Negative for chills, fever and malaise/fatigue.  Eyes:  Negative for blurred vision.  Respiratory:  Negative for shortness of breath.   Cardiovascular:  Negative for chest pain.  Genitourinary:  Negative for  dysuria.  Neurological:  Positive for dizziness. Negative for speech change, focal weakness, seizures, loss of consciousness, weakness and headaches.      Objective:     BP (!) 142/70 (BP Location: Left Arm, Patient Position: Sitting, Cuff Size: Large)   Pulse 76   Temp 97.9 F (36.6 C) (Oral)   Ht 6\' 4"  (1.93 m)   SpO2 99%   BMI 33.19 kg/m  BP Readings from Last 3 Encounters:  10/10/22 (!) 142/70  09/12/22 (!) 140/68  02/28/22 134/72   Wt Readings from Last 3 Encounters:  10/10/22 274 lb (124.3 kg)  09/12/22 272 lb 11.2 oz (123.7 kg)  02/28/22 277 lb 9.6 oz (125.9 kg)      Physical Exam Vitals reviewed.   Constitutional:      Appearance: He is well-developed.  HENT:     Right Ear: External ear normal.     Left Ear: External ear normal.  Eyes:     Pupils: Pupils are equal, round, and reactive to light.  Neck:     Thyroid: No thyromegaly.  Cardiovascular:     Rate and Rhythm: Normal rate and regular rhythm.  Pulmonary:     Effort: Pulmonary effort is normal. No respiratory distress.     Breath sounds: Normal breath sounds. No wheezing or rales.  Musculoskeletal:     Cervical back: Neck supple.     Right lower leg: No edema.     Left lower leg: No edema.  Neurological:     Mental Status: He is alert and oriented to person, place, and time.     Cranial Nerves: No cranial nerve deficit.     Motor: No weakness.     Coordination: Coordination normal.     Gait: Gait normal.      No results found for any visits on 10/10/22.  Last CBC Lab Results  Component Value Date   WBC 13.4 (H) 12/04/2018   HGB 12.6 (L) 12/04/2018   HCT 36.2 (L) 12/04/2018   MCV 86.4 12/04/2018   MCH 30.1 12/04/2018   RDW 11.5 12/04/2018   PLT 404 (H) 12/04/2018   Last metabolic panel Lab Results  Component Value Date   GLUCOSE 395 (H) 02/28/2022   NA 137 02/28/2022   K 3.9 02/28/2022   CL 97 02/28/2022   CO2 31 02/28/2022   BUN 16 02/28/2022   CREATININE 1.15 02/28/2022   GFR 65.11 02/28/2022   CALCIUM 9.7 02/28/2022   PHOS 4.2 09/07/2008   PROT 7.3 02/28/2022   ALBUMIN 4.7 02/28/2022   BILITOT 0.9 02/28/2022   ALKPHOS 79 02/28/2022   AST 25 02/28/2022   ALT 31 02/28/2022   ANIONGAP 14 12/05/2018   Last lipids Lab Results  Component Value Date   CHOL 135 02/28/2022   HDL 49.00 02/28/2022   LDLCALC 52 02/28/2022   LDLDIRECT 102.0 01/27/2018   TRIG 169.0 (H) 02/28/2022   CHOLHDL 3 02/28/2022   Last hemoglobin A1c Lab Results  Component Value Date   HGBA1C 9.0 (A) 09/12/2022      The 10-year ASCVD risk score (Arnett DK, et al., 2019) is: 33.5%    Assessment & Plan:   #1  Hypertension- some improved with aldactone.  Check BMP.  Low sodium diet and lose some weight.  #2  type 2 diabetes poorly controlled-.  Reviewed options of endocrine referral vs 3 months of lifestyle change and weight loss and  he prefers the latter.  Recheck A1C in 3 months.    #3  transient vertigo.  Non-focal neuro exam.   Gait normal.   No ataxia.  Continue with OTC Meclizine as needed.   Reviewed signs and symptoms of more worrisome vertigo.    Return in about 3 months (around 01/10/2023).    Evelena Peat, MD

## 2022-10-10 NOTE — Patient Instructions (Signed)
Try to lose some weight  Watch sodium intake  Let's plan on 3 month follow up.

## 2022-10-20 ENCOUNTER — Other Ambulatory Visit (INDEPENDENT_AMBULATORY_CARE_PROVIDER_SITE_OTHER): Payer: PPO

## 2022-10-20 DIAGNOSIS — G894 Chronic pain syndrome: Secondary | ICD-10-CM | POA: Diagnosis not present

## 2022-10-20 DIAGNOSIS — E1142 Type 2 diabetes mellitus with diabetic polyneuropathy: Secondary | ICD-10-CM | POA: Diagnosis not present

## 2022-10-20 DIAGNOSIS — M961 Postlaminectomy syndrome, not elsewhere classified: Secondary | ICD-10-CM | POA: Diagnosis not present

## 2022-10-20 DIAGNOSIS — I10 Essential (primary) hypertension: Secondary | ICD-10-CM | POA: Diagnosis not present

## 2022-10-20 DIAGNOSIS — Z79891 Long term (current) use of opiate analgesic: Secondary | ICD-10-CM | POA: Diagnosis not present

## 2022-10-20 LAB — BASIC METABOLIC PANEL
BUN: 19 mg/dL (ref 6–23)
CO2: 26 mEq/L (ref 19–32)
Calcium: 9.6 mg/dL (ref 8.4–10.5)
Chloride: 101 mEq/L (ref 96–112)
Creatinine, Ser: 1.16 mg/dL (ref 0.40–1.50)
GFR: 64.14 mL/min (ref >60.00)
Glucose, Bld: 377 mg/dL — ABNORMAL HIGH (ref 70–99)
Potassium: 4.4 mEq/L (ref 3.5–5.1)
Sodium: 136 mEq/L (ref 135–145)

## 2022-11-15 ENCOUNTER — Other Ambulatory Visit: Payer: Self-pay | Admitting: Family Medicine

## 2022-11-15 DIAGNOSIS — E119 Type 2 diabetes mellitus without complications: Secondary | ICD-10-CM

## 2022-11-17 ENCOUNTER — Other Ambulatory Visit: Payer: Self-pay | Admitting: Family Medicine

## 2022-12-10 ENCOUNTER — Telehealth: Payer: Self-pay | Admitting: Family Medicine

## 2022-12-10 MED ORDER — LANTUS SOLOSTAR 100 UNIT/ML ~~LOC~~ SOPN: PEN_INJECTOR | SUBCUTANEOUS | 0 refills | Status: DC

## 2022-12-10 NOTE — Telephone Encounter (Signed)
Prescription Request  12/10/2022  LOV: 10/10/2022  What is the name of the medication or equipment?       LANTUS SOLOSTAR 100 UNIT/ML Solostar Pen  Have you contacted your pharmacy to request a refill? Yes   Which pharmacy would you like this sent to?  [id number] [place], [place] - [address] AT [facility] [address] [facility] [address] [facility] [address] [place] [address] Phone: [phone] Fax: [phone]    Patient notified that their request is being sent to the clinical staff for review and that they should receive a response within 2 business days.   Please advise at Mobile [phone] (mobile)

## 2022-12-10 NOTE — Telephone Encounter (Signed)
Rx sent 

## 2022-12-15 DIAGNOSIS — E1142 Type 2 diabetes mellitus with diabetic polyneuropathy: Secondary | ICD-10-CM | POA: Diagnosis not present

## 2022-12-15 DIAGNOSIS — G894 Chronic pain syndrome: Secondary | ICD-10-CM | POA: Diagnosis not present

## 2022-12-15 DIAGNOSIS — Z79891 Long term (current) use of opiate analgesic: Secondary | ICD-10-CM | POA: Diagnosis not present

## 2022-12-15 DIAGNOSIS — M961 Postlaminectomy syndrome, not elsewhere classified: Secondary | ICD-10-CM | POA: Diagnosis not present

## 2022-12-20 ENCOUNTER — Other Ambulatory Visit: Payer: Self-pay | Admitting: Family Medicine

## 2022-12-26 ENCOUNTER — Other Ambulatory Visit: Payer: Self-pay | Admitting: Family Medicine

## 2022-12-26 DIAGNOSIS — Z1212 Encounter for screening for malignant neoplasm of rectum: Secondary | ICD-10-CM

## 2022-12-26 DIAGNOSIS — Z1211 Encounter for screening for malignant neoplasm of colon: Secondary | ICD-10-CM

## 2023-01-02 ENCOUNTER — Other Ambulatory Visit: Payer: Self-pay | Admitting: Family Medicine

## 2023-01-13 ENCOUNTER — Other Ambulatory Visit: Payer: Self-pay | Admitting: Family Medicine

## 2023-01-13 DIAGNOSIS — E1165 Type 2 diabetes mellitus with hyperglycemia: Secondary | ICD-10-CM

## 2023-01-13 NOTE — Progress Notes (Signed)
Referral for pharmacy consult regarding poorly controlled type 2 diabetes  Kristian Covey MD  Primary Care at [facility]

## 2023-01-14 ENCOUNTER — Ambulatory Visit: Payer: PPO | Admitting: Family Medicine

## 2023-01-16 ENCOUNTER — Telehealth: Payer: Self-pay

## 2023-01-16 NOTE — Progress Notes (Unsigned)
   Care Guide Note  01/16/2023 Name: Nathanel Mubarak. MRN: [id number] DOB: [birth year]  Referred by: Kristian Covey, MD Reason for referral : Care Coordination (Outreach to schedule with Pharm d )   [name] is a 72 y.o. year old male who is a primary care patient of Burchette, Elberta Fortis, MD. [name] was referred to the pharmacist for assistance related to DM.    An unsuccessful telephone outreach was attempted today to contact the patient who was referred to the pharmacy team for assistance with medication management. Additional attempts will be made to contact the patient.   Penne Lash, RMA Care Guide [facility]  [place], [place] [address] Direct Dial: [phone] [email or web address]

## 2023-01-23 NOTE — Progress Notes (Unsigned)
   Care Guide Note  01/23/2023 Name: Holger Sokolowski. MRN: [id number] DOB: 1950/11/08  Referred by: Kristian Covey, MD Reason for referral : Care Coordination (Outreach to schedule with Pharm d )   [name] is a 72 y.o. year old male who is a primary care patient of Burchette, Elberta Fortis, MD. [name] was referred to the pharmacist for assistance related to DM.    A second unsuccessful telephone outreach was attempted today to contact the patient who was referred to the pharmacy team for assistance with medication management. Additional attempts will be made to contact the patient.  Penne Lash, RMA Care Guide [facility]  [place], [place] [address] Direct Dial: [phone] [email or web address]

## 2023-01-27 NOTE — Progress Notes (Signed)
   Care Guide Note  01/27/2023 Name: [name]. MRN: [id number] DOB: October 11, 1950  Referred by: Kristian Covey, MD Reason for referral : Care Coordination (Outreach to schedule with Pharm d )   Michael Gin. is a 72 y.o. year old male who is a primary care patient of Burchette, Elberta Fortis, MD. Michael Gin. was referred to the pharmacist for assistance related to DM.    A third unsuccessful telephone outreach was attempted today to contact the patient who was referred to the pharmacy team for assistance with medication management. The Population Health team is pleased to engage with this patient at any time in the future upon receipt of referral and should he/she be interested in assistance from the [facility] team.   Penne Lash, RMA Care Guide [facility]  [place], [place] [address] Direct Dial: [phone] [email or web address]

## 2023-02-06 ENCOUNTER — Encounter: Payer: Self-pay | Admitting: Family Medicine

## 2023-02-06 ENCOUNTER — Ambulatory Visit (INDEPENDENT_AMBULATORY_CARE_PROVIDER_SITE_OTHER): Payer: PPO | Admitting: Family Medicine

## 2023-02-06 VITALS — BP 130/70 | HR 68 | Temp 97.9°F | Ht 76.0 in

## 2023-02-06 DIAGNOSIS — Z7984 Long term (current) use of oral hypoglycemic drugs: Secondary | ICD-10-CM

## 2023-02-06 DIAGNOSIS — E1165 Type 2 diabetes mellitus with hyperglycemia: Secondary | ICD-10-CM | POA: Diagnosis not present

## 2023-02-06 DIAGNOSIS — Z794 Long term (current) use of insulin: Secondary | ICD-10-CM | POA: Diagnosis not present

## 2023-02-06 DIAGNOSIS — Z23 Encounter for immunization: Secondary | ICD-10-CM

## 2023-02-06 DIAGNOSIS — I10 Essential (primary) hypertension: Secondary | ICD-10-CM | POA: Diagnosis not present

## 2023-02-06 DIAGNOSIS — E785 Hyperlipidemia, unspecified: Secondary | ICD-10-CM

## 2023-02-06 LAB — POCT GLYCOSYLATED HEMOGLOBIN (HGB A1C): Hemoglobin A1C: 8.8 % — AB (ref 4.0–5.6)

## 2023-02-06 LAB — COMPREHENSIVE METABOLIC PANEL
ALT: 29 U/L (ref 0–53)
AST: 18 U/L (ref 0–37)
Albumin: 4.7 g/dL (ref 3.5–5.2)
Alkaline Phosphatase: 80 U/L (ref 39–117)
BUN: 15 mg/dL (ref 6–23)
CO2: 28 meq/L (ref 19–32)
Calcium: 9.3 mg/dL (ref 8.4–10.5)
Chloride: 100 meq/L (ref 96–112)
Creatinine, Ser: 1.15 mg/dL (ref 0.40–1.50)
GFR: 64.68 mL/min (ref >60.00)
Glucose, Bld: 295 mg/dL — ABNORMAL HIGH (ref 70–99)
Potassium: 4.4 meq/L (ref 3.5–5.1)
Sodium: 136 meq/L (ref 135–145)
Total Bilirubin: 1.2 mg/dL (ref 0.2–1.2)
Total Protein: 7.2 g/dL (ref 6.0–8.3)

## 2023-02-06 LAB — LIPID PANEL
Cholesterol: 143 mg/dL (ref 0–200)
HDL: 47.2 mg/dL (ref >39.00)
LDL Cholesterol: 54 mg/dL (ref 0–99)
NonHDL: 95.51
Total CHOL/HDL Ratio: 3
Triglycerides: 208 mg/dL — ABNORMAL HIGH (ref 0.0–149.0)
VLDL: 41.6 mg/dL — ABNORMAL HIGH (ref 0.0–40.0)

## 2023-02-06 MED ORDER — PIOGLITAZONE HCL 15 MG PO TABS: 15.0000 mg | ORAL_TABLET | Freq: Every day | ORAL | 3 refills | Status: DC

## 2023-02-06 NOTE — Addendum Note (Signed)
Addended by: Christy Sartorius on: 02/06/2023 08:47 AM   Modules accepted: Orders

## 2023-02-06 NOTE — Progress Notes (Signed)
Established Patient Office Visit  Subjective   Patient ID: Michael Ewert., male    DOB: 01-29-1951  Age: 72 y.o. MRN: [id number]  Chief Complaint  Patient presents with   Medical Management of Chronic Issues    HPI   [name] is seen today for medical follow-up.  He has been having some ongoing problems with bulging disc in his back.  Followed by chronic pain management.  He has chronic problems including hypertension, history of fatty liver changes, type 2 diabetes, remote history of pancreatitis, hyperlipidemia.  He had elevated blood pressure last summer and we started Aldactone and has seen some improvement in blood pressure since then.  No recent dizziness.  Usually enjoys gardening but had to give that up the summer because of his back issues.  He misses that tremendously.  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  He does a lot of woodcarving.  Poorly controlled diabetes.  Last A1c 9.0.  He is tightened up some diet issues since then.  Remains on metformin, Lantus, and NovoLog.  We had recommended endocrine referral and referrals been placed twice but he still not heard anything back  Hyperlipidemia treated with atorvastatin 20 mg daily.  Needs follow-up labs. Also requesting flu vaccine today  Past Medical History:  Diagnosis Date   Arthritis    oa--and ra.  s/p right knee replacement on Jan 28, 2011--plans left knee replacement on 03/04/11.  also severe lower back pain-previous lumbar fusions, also pain in both hips, shoulders    Aspiration pneumonia (HCC)    Asthma    Diabetes mellitus    Gallstone pancreatitis    GERD (gastroesophageal reflux disease)    Hepatitis    pancreatitis 2010   Hyperlipidemia    Hypertension    eccho 6/10, EKG 1/11, clearence Dr Caryl Never from 11/28/10 on chart   Peripheral vascular disease (HCC)    states after arthroscopy left and right was given blood thinners- had symptoms of blood clot but wasnt diagnosed    Pneumonia    2010   Sleep apnea    study years ago/ no cpap/ states mild   Past Surgical History:  Procedure Laterality Date   BACK SURGERY     hx of multiple back surgeries including lumbar fusion S1-L3   BIOPSY  12/04/2018   Procedure: BIOPSY;  Surgeon: Benancio Deeds, MD;  Location: [facility];  Service: Gastroenterology;;   CHOLECYSTECTOMY     COLON SURGERY     removal of part of small intestine 1979   ELBOW SURGERY     3 tendon repairs on right; 5 tendon repairs on left   ESOPHAGOGASTRODUODENOSCOPY (EGD) WITH PROPOFOL N/A 12/04/2018   Procedure: ESOPHAGOGASTRODUODENOSCOPY (EGD) WITH PROPOFOL;  Surgeon: Benancio Deeds, MD;  Location: [facility];  Service: Gastroenterology;  Laterality: N/A;   SHOULDER SURGERY     left x 2, right x 3   TOTAL KNEE ARTHROPLASTY  01/28/2011   Procedure: TOTAL KNEE ARTHROPLASTY;  Surgeon: Shelda Pal;  Location: [facility];  Service: Orthopedics;  Laterality: Right;  femoral nerve block preop.   TOTAL KNEE ARTHROPLASTY  03/04/2011   Procedure: TOTAL KNEE ARTHROPLASTY;  Surgeon: Shelda Pal;  Location: [facility];  Service: Orthopedics;  Laterality: Left;    reports that he has never smoked. He quit smokeless tobacco use about 32 years ago.  His smokeless tobacco use included snuff. He reports that he does not drink alcohol and does not use  drugs. family history is not on file. Allergies  Allergen Reactions   Cortisone Itching    Hives and causes blood sugar to elevate   Jardiance [Empagliflozin] Other (See Comments)    Yeast balanitis    Review of Systems  Constitutional:  Negative for chills, fever and malaise/fatigue.  Eyes:  Negative for blurred vision.  Respiratory:  Negative for shortness of breath.   Cardiovascular:  Negative for chest pain.  Musculoskeletal:  Positive for back pain.  Neurological:  Negative for dizziness, weakness and headaches.      Objective:     There were no vitals taken for this visit. BP  Readings from Last 3 Encounters:  10/13/22 (!) 142/70  09/12/22 (!) 140/68  02/28/22 134/72   Wt Readings from Last 3 Encounters:  10/10/22 274 lb (124.3 kg)  09/12/22 272 lb 11.2 oz (123.7 kg)  02/28/22 277 lb 9.6 oz (125.9 kg)      Physical Exam Vitals reviewed.  Constitutional:      Appearance: He is well-developed.  Eyes:     Pupils: Pupils are equal, round, and reactive to light.  Neck:     Thyroid: No thyromegaly.  Cardiovascular:     Rate and Rhythm: Normal rate and regular rhythm.  Pulmonary:     Effort: Pulmonary effort is normal. No respiratory distress.     Breath sounds: Normal breath sounds. No wheezing or rales.  Musculoskeletal:     Cervical back: Neck supple.     Right lower leg: No edema.     Left lower leg: No edema.  Neurological:     Mental Status: He is alert and oriented to person, place, and time.      Results for orders placed or performed in visit on 02/06/23  POC HgB A1c  Result Value Ref Range   Hemoglobin A1C 8.8 (A) 4.0 - 5.6 %   HbA1c POC (<> result, manual entry)     HbA1c, POC (prediabetic range)     HbA1c, POC (controlled diabetic range)        The 10-year ASCVD risk score (Arnett DK, et al., 2019) is: 35.9%    Assessment & Plan:   #1 type 2 diabetes poorly controlled with A1c 8.8%.  We have previously placed endocrine referral but he never heard back.  After much discussion we decided to go ahead and start Actos 15 mg once daily in addition to his insulin and metformin.  Set up 25-month follow-up.  Also consider consultation with our clinical pharmacist.  Will discuss at follow-up if not to goal  #2 hypertension improved after recent addition of Aldactone.  Follow-up basic metabolic panel stable.  Continue current blood pressure regimen  #3 hyperlipidemia treated with atorvastatin.  Check lipid and CMP today.  #4 health maintenance-flu vaccine given   Return in about 3 months (around 05/09/2023).    Evelena Peat, MD

## 2023-02-06 NOTE — Patient Instructions (Signed)
A1C 8.8  Start the Actos 15 mg once daily  Set up 3 month follow up.

## 2023-02-12 DIAGNOSIS — Z79891 Long term (current) use of opiate analgesic: Secondary | ICD-10-CM | POA: Diagnosis not present

## 2023-02-12 DIAGNOSIS — G894 Chronic pain syndrome: Secondary | ICD-10-CM | POA: Diagnosis not present

## 2023-02-12 DIAGNOSIS — M25552 Pain in left hip: Secondary | ICD-10-CM | POA: Diagnosis not present

## 2023-02-12 DIAGNOSIS — M961 Postlaminectomy syndrome, not elsewhere classified: Secondary | ICD-10-CM | POA: Diagnosis not present

## 2023-03-04 ENCOUNTER — Encounter: Payer: Self-pay | Admitting: Family Medicine

## 2023-03-04 ENCOUNTER — Other Ambulatory Visit: Payer: Self-pay | Admitting: Family Medicine

## 2023-03-07 ENCOUNTER — Other Ambulatory Visit: Payer: Self-pay | Admitting: Family Medicine

## 2023-03-08 ENCOUNTER — Other Ambulatory Visit: Payer: Self-pay | Admitting: Family Medicine

## 2023-03-08 DIAGNOSIS — E119 Type 2 diabetes mellitus without complications: Secondary | ICD-10-CM

## 2023-03-20 ENCOUNTER — Other Ambulatory Visit: Payer: Self-pay | Admitting: Family Medicine

## 2023-03-30 ENCOUNTER — Other Ambulatory Visit: Payer: Self-pay | Admitting: Family Medicine

## 2023-04-05 ENCOUNTER — Other Ambulatory Visit: Payer: Self-pay | Admitting: Family Medicine

## 2023-04-07 ENCOUNTER — Other Ambulatory Visit: Payer: Self-pay | Admitting: Family Medicine

## 2023-04-13 ENCOUNTER — Telehealth: Payer: Self-pay

## 2023-04-13 ENCOUNTER — Other Ambulatory Visit (HOSPITAL_COMMUNITY): Payer: Self-pay

## 2023-04-13 DIAGNOSIS — M961 Postlaminectomy syndrome, not elsewhere classified: Secondary | ICD-10-CM | POA: Diagnosis not present

## 2023-04-13 DIAGNOSIS — Z79891 Long term (current) use of opiate analgesic: Secondary | ICD-10-CM | POA: Diagnosis not present

## 2023-04-13 DIAGNOSIS — G894 Chronic pain syndrome: Secondary | ICD-10-CM | POA: Diagnosis not present

## 2023-04-13 DIAGNOSIS — M25552 Pain in left hip: Secondary | ICD-10-CM | POA: Diagnosis not present

## 2023-04-13 NOTE — Telephone Encounter (Signed)
*  Primary  Pharmacy Patient Advocate Encounter   Received notification from Fax that prior authorization for Insulin Aspart FlexPen 100UNIT/ML pen-injectors  is required/requested.   Insurance verification completed.   The patient is insured through [facility] ADVANTAGE/RX ADVANCE .   Per test claim:  Brand Novolog is preferred by the insurance.  If suggested medication is appropriate, Please send in a new RX and discontinue this one. If not, please advise as to why it's not appropriate so that we may request a Prior Authorization. Please note, some preferred medications may still require a PA

## 2023-05-08 ENCOUNTER — Ambulatory Visit: Payer: PPO | Admitting: Family Medicine

## 2023-05-31 ENCOUNTER — Other Ambulatory Visit: Payer: Self-pay | Admitting: Family Medicine

## 2023-06-07 ENCOUNTER — Other Ambulatory Visit: Payer: Self-pay | Admitting: Family Medicine

## 2023-06-07 DIAGNOSIS — E119 Type 2 diabetes mellitus without complications: Secondary | ICD-10-CM

## 2023-06-08 DIAGNOSIS — M961 Postlaminectomy syndrome, not elsewhere classified: Secondary | ICD-10-CM | POA: Diagnosis not present

## 2023-06-08 DIAGNOSIS — Z79891 Long term (current) use of opiate analgesic: Secondary | ICD-10-CM | POA: Diagnosis not present

## 2023-06-08 DIAGNOSIS — G894 Chronic pain syndrome: Secondary | ICD-10-CM | POA: Diagnosis not present

## 2023-06-08 DIAGNOSIS — M25552 Pain in left hip: Secondary | ICD-10-CM | POA: Diagnosis not present

## 2023-06-09 ENCOUNTER — Ambulatory Visit: Payer: PPO | Admitting: Family Medicine

## 2023-07-01 ENCOUNTER — Ambulatory Visit (INDEPENDENT_AMBULATORY_CARE_PROVIDER_SITE_OTHER): Admitting: Family Medicine

## 2023-07-01 VITALS — BP 150/70 | HR 70 | Temp 97.5°F

## 2023-07-01 DIAGNOSIS — I10 Essential (primary) hypertension: Secondary | ICD-10-CM

## 2023-07-01 DIAGNOSIS — E1165 Type 2 diabetes mellitus with hyperglycemia: Secondary | ICD-10-CM

## 2023-07-01 DIAGNOSIS — E785 Hyperlipidemia, unspecified: Secondary | ICD-10-CM | POA: Diagnosis not present

## 2023-07-01 DIAGNOSIS — Z7984 Long term (current) use of oral hypoglycemic drugs: Secondary | ICD-10-CM

## 2023-07-01 LAB — POCT GLYCOSYLATED HEMOGLOBIN (HGB A1C): Hemoglobin A1C: 7.4 % — AB (ref 4.0–5.6)

## 2023-07-01 MED ORDER — HYDRALAZINE HCL 25 MG PO TABS
25.0000 mg | ORAL_TABLET | Freq: Two times a day (BID) | ORAL | 5 refills | Status: DC
Start: 2023-07-01 — End: 2023-12-29

## 2023-07-01 NOTE — Patient Instructions (Signed)
Set up one month follow up.

## 2023-07-01 NOTE — Progress Notes (Signed)
 Established Patient Office Visit  Subjective   Patient ID: [name]., male    DOB: Mar 19, 1951  Age: 73 y.o. MRN: [id number]  No chief complaint on file.   HPI   [place] seen for medical follow-up.  He has had ongoing orthopedic issues.  He has been told by orthopedics that he may need bilateral hip replacements.  Also apparently may be looking at bilateral shoulder surgery for replacement at some point.  He plans to look at getting the hips replaced first.  Longstanding history of type 2 diabetes.  Last A1c was poorly controlled 8.8%.  He does take insulin along with Actos and metformin.  He had lipids checked last visit and LDL cholesterol 54.  Is on atorvastatin.  Compliant with medications.  He does need to get follow-up eye exam rescheduled.  Also needs urine microalbumin but would like to wait till next visit.  He is on high-dose lisinopril 40 mg daily.  Hypertension currently treated with metoprolol, amlodipine, lisinopril, and Aldactone.  He has had several home readings recently run 150s systolic.  No alcohol use.  No recent dietary changes.  No headaches.  Past Medical History:  Diagnosis Date   Arthritis    oa--and ra.  s/p right knee replacement on Jan 28, 2011--plans left knee replacement on 03/04/11.  also severe lower back pain-previous lumbar fusions, also pain in both hips, shoulders    Aspiration pneumonia (HCC)    Asthma    Diabetes mellitus    Gallstone pancreatitis    GERD (gastroesophageal reflux disease)    Hepatitis    pancreatitis 2010   Hyperlipidemia    Hypertension    eccho 6/10, EKG 1/11, clearence Dr Caryl Never from 11/28/10 on chart   Peripheral vascular disease (HCC)    states after arthroscopy left and right was given blood thinners- had symptoms of blood clot but wasnt diagnosed   Pneumonia    2010   Sleep apnea    study years ago/ no cpap/ states mild   Past Surgical History:  Procedure Laterality Date   BACK SURGERY     hx of  multiple back surgeries including lumbar fusion S1-L3   BIOPSY  12/04/2018   Procedure: BIOPSY;  Surgeon: Benancio Deeds, MD;  [facility]: [facility];  Service: Gastroenterology;;   CHOLECYSTECTOMY     COLON SURGERY     removal of part of small intestine 1979   ELBOW SURGERY     3 tendon repairs on right; 5 tendon repairs on left   ESOPHAGOGASTRODUODENOSCOPY (EGD) WITH PROPOFOL N/A 12/04/2018   Procedure: ESOPHAGOGASTRODUODENOSCOPY (EGD) WITH PROPOFOL;  Surgeon: Benancio Deeds, MD;  [facility]: [facility];  Service: Gastroenterology;  Laterality: N/A;   SHOULDER SURGERY     left x 2, right x 3   TOTAL KNEE ARTHROPLASTY  01/28/2011   Procedure: TOTAL KNEE ARTHROPLASTY;  Surgeon: Shelda Pal;  Location: [facility];  Service: Orthopedics;  Laterality: Right;  femoral nerve block preop.   TOTAL KNEE ARTHROPLASTY  03/04/2011   Procedure: TOTAL KNEE ARTHROPLASTY;  Surgeon: Shelda Pal;  Location: [facility];  Service: Orthopedics;  Laterality: Left;    reports that he has never smoked. He quit smokeless tobacco use about 32 years ago.  His smokeless tobacco use included snuff. He reports that he does not drink alcohol and does not use drugs. family history is not on file. Allergies  Allergen Reactions   Cortisone Itching    Hives and causes blood sugar to  elevate   Jardiance [Empagliflozin] Other (See Comments)    Yeast balanitis    Review of Systems  Constitutional:  Negative for chills, fever and malaise/fatigue.  Eyes:  Negative for blurred vision.  Respiratory:  Negative for shortness of breath.   Cardiovascular:  Negative for chest pain.  Neurological:  Negative for dizziness, weakness and headaches.      Objective:     BP (!) 150/70 (BP Location: Left Arm, Patient Position: Sitting, Cuff Size: Large)   Pulse 70   Temp (!) 97.5 F (36.4 C) (Oral)   SpO2 99%  BP Readings from Last 3 Encounters:  07/01/23 (!) 150/70  02/06/23 130/70  10/13/22 (!) 142/70   Wt  Readings from Last 3 Encounters:  10/10/22 274 lb (124.3 kg)  09/12/22 272 lb 11.2 oz (123.7 kg)  02/28/22 277 lb 9.6 oz (125.9 kg)      Physical Exam Vitals reviewed.  Constitutional:      General: He is not in acute distress.    Appearance: He is well-developed. He is not ill-appearing.  Eyes:     Pupils: Pupils are equal, round, and reactive to light.  Neck:     Thyroid: No thyromegaly.  Cardiovascular:     Rate and Rhythm: Normal rate and regular rhythm.  Pulmonary:     Effort: Pulmonary effort is normal. No respiratory distress.     Breath sounds: Normal breath sounds. No wheezing or rales.  Musculoskeletal:     Cervical back: Neck supple.  Neurological:     Mental Status: He is alert and oriented to person, place, and time.      Results for orders placed or performed in visit on 07/01/23  POC HgB A1c  Result Value Ref Range   Hemoglobin A1C 7.4 (A) 4.0 - 5.6 %   HbA1c POC (<> result, manual entry)     HbA1c, POC (prediabetic range)     HbA1c, POC (controlled diabetic range)        The 10-year ASCVD risk score (Arnett DK, et al., 2019) is: 40.3%    Assessment & Plan:   #1 type 2 diabetes improved with A1c 7.4%.  He would like to continue current regimen versus adding additional medications.  Cannot use GLP-1 medications or DPP 4 inhibitors with history of pancreatitis.  We did remind him to set up eye exam.  Needs urine microalbumin screen he declines today but will try to get next visit when we get labs  #2 hypertension suboptimally controlled.  Multiple home readings have been elevated as well.  Continue current medication as above and add hydralazine 25 mg twice daily.  Set up 1 month follow-up to reassess  #3 hyperlipidemia treated with atorvastatin 20 mg daily.  Recent LDL cholesterol 54.  Continue current regimen. Return in about 1 month (around 07/31/2023).    Evelena Peat, MD

## 2023-07-04 ENCOUNTER — Other Ambulatory Visit: Payer: Self-pay | Admitting: Family Medicine

## 2023-07-19 ENCOUNTER — Other Ambulatory Visit: Payer: Self-pay | Admitting: Family Medicine

## 2023-07-19 DIAGNOSIS — E119 Type 2 diabetes mellitus without complications: Secondary | ICD-10-CM

## 2023-07-31 ENCOUNTER — Ambulatory Visit: Admitting: Family Medicine

## 2023-08-04 DIAGNOSIS — Z79891 Long term (current) use of opiate analgesic: Secondary | ICD-10-CM | POA: Diagnosis not present

## 2023-08-04 DIAGNOSIS — G894 Chronic pain syndrome: Secondary | ICD-10-CM | POA: Diagnosis not present

## 2023-08-05 DIAGNOSIS — M961 Postlaminectomy syndrome, not elsewhere classified: Secondary | ICD-10-CM | POA: Diagnosis not present

## 2023-08-05 DIAGNOSIS — G894 Chronic pain syndrome: Secondary | ICD-10-CM | POA: Diagnosis not present

## 2023-08-05 DIAGNOSIS — M25552 Pain in left hip: Secondary | ICD-10-CM | POA: Diagnosis not present

## 2023-08-05 DIAGNOSIS — Z79891 Long term (current) use of opiate analgesic: Secondary | ICD-10-CM | POA: Diagnosis not present

## 2023-08-12 ENCOUNTER — Ambulatory Visit (INDEPENDENT_AMBULATORY_CARE_PROVIDER_SITE_OTHER): Admitting: Family Medicine

## 2023-08-12 ENCOUNTER — Encounter: Payer: Self-pay | Admitting: Family Medicine

## 2023-08-12 VITALS — BP 122/60 | HR 78 | Temp 97.6°F

## 2023-08-12 DIAGNOSIS — E785 Hyperlipidemia, unspecified: Secondary | ICD-10-CM | POA: Diagnosis not present

## 2023-08-12 DIAGNOSIS — E1165 Type 2 diabetes mellitus with hyperglycemia: Secondary | ICD-10-CM

## 2023-08-12 DIAGNOSIS — M48062 Spinal stenosis, lumbar region with neurogenic claudication: Secondary | ICD-10-CM | POA: Diagnosis not present

## 2023-08-12 DIAGNOSIS — I10 Essential (primary) hypertension: Secondary | ICD-10-CM | POA: Diagnosis not present

## 2023-08-12 NOTE — Patient Instructions (Signed)
 BP much improved-  I am very pleased with BP response to the Hydralazine 

## 2023-08-12 NOTE — Progress Notes (Signed)
 Established Patient Office Visit  Subjective   Patient ID: Michael Swavely., male    DOB: [birth year]  Age: 73 y.o. MRN: [id number]  Chief Complaint  Patient presents with   Medical Management of Chronic Issues    HPI   [name] is seen for medical follow-up.  He has resistant hypertension.  Last visit blood pressure was still up.  He was already on regimen of amlodipine  10 mg daily, Aldactone  25 mg daily, Toprol -XL 100 mg daily, and lisinopril  40 mg daily.  We added hydralazine  25 mg twice daily.  He is seen good improvement with blood pressure at home.  Had been getting a lot of 150s systolic now low 409W.  Diastolics consistently well-controlled as well.  He feels better overall.  No headaches or dizziness.  Unfortunately has had progression of his spinal stenosis.  Had recent MRI per pain management specialist and this showed progression of his spinal stenosis.  He is declining any further surgeries.  Type 2 diabetes with recent A1c 7.4%.  Diabetic medications reviewed.  Compliant with all.  No hypoglycemia.  He is struggling to try to lose some weight.  Very limited in activities because of his spinal stenosis but does do exercise bike about 10 miles per day.  Hyperlipidemia treated with atorvastatin  20 mg daily.  Lipids were checked last November with LDL cholesterol 54.  Past Medical History:  Diagnosis Date   Arthritis    oa--and ra.  s/p right knee replacement on Jan 28, 2011--plans left knee replacement on 03/04/11.  also severe lower back pain-previous lumbar fusions, also pain in both hips, shoulders    Aspiration pneumonia (HCC)    Asthma    Diabetes mellitus    Gallstone pancreatitis    GERD (gastroesophageal reflux disease)    Hepatitis    pancreatitis 2010   Hyperlipidemia    Hypertension    eccho 6/10, EKG 1/11, clearence Dr Darren Em from 11/28/10 on chart   Peripheral vascular disease (HCC)    states after arthroscopy left and right was given blood  thinners- had symptoms of blood clot but wasnt diagnosed   Pneumonia    2010   Sleep apnea    study years ago/ no cpap/ states mild   Past Surgical History:  Procedure Laterality Date   BACK SURGERY     hx of multiple back surgeries including lumbar fusion S1-L3   BIOPSY  12/04/2018   Procedure: BIOPSY;  Surgeon: Ace Holder, MD;  Location: [facility];  Service: Gastroenterology;;   CHOLECYSTECTOMY     COLON SURGERY     removal of part of small intestine 1979   ELBOW SURGERY     3 tendon repairs on right; 5 tendon repairs on left   ESOPHAGOGASTRODUODENOSCOPY (EGD) WITH PROPOFOL  N/A 12/04/2018   Procedure: ESOPHAGOGASTRODUODENOSCOPY (EGD) WITH PROPOFOL ;  Surgeon: Ace Holder, MD;  Location: [facility];  Service: Gastroenterology;  Laterality: N/A;   SHOULDER SURGERY     left x 2, right x 3   TOTAL KNEE ARTHROPLASTY  01/28/2011   Procedure: TOTAL KNEE ARTHROPLASTY;  Surgeon: Bevin Bucks;  Location: [facility];  Service: Orthopedics;  Laterality: Right;  femoral nerve block preop.   TOTAL KNEE ARTHROPLASTY  03/04/2011   Procedure: TOTAL KNEE ARTHROPLASTY;  Surgeon: Bevin Bucks;  Location: [facility];  Service: Orthopedics;  Laterality: Left;    reports that he has never smoked. He quit smokeless tobacco use about 32 years ago.  His smokeless tobacco use  included snuff. He reports that he does not drink alcohol and does not use drugs. family history is not on file. Allergies  Allergen Reactions   Cortisone Itching    Hives and causes blood sugar to elevate   Jardiance  [Empagliflozin ] Other (See Comments)    Yeast balanitis    Review of Systems  Constitutional:  Negative for malaise/fatigue.  Eyes:  Negative for blurred vision.  Respiratory:  Negative for shortness of breath.   Cardiovascular:  Negative for chest pain.  Musculoskeletal:  Positive for back pain.  Neurological:  Negative for dizziness, weakness and headaches.      Objective:     BP 122/60  (BP Location: Left Arm, Cuff Size: Large)   Pulse 78   Temp 97.6 F (36.4 C) (Oral)   SpO2 98%  BP Readings from Last 3 Encounters:  08/12/23 122/60  07/01/23 (!) 150/70  02/06/23 130/70   Wt Readings from Last 3 Encounters:  10/10/22 274 lb (124.3 kg)  09/12/22 272 lb 11.2 oz (123.7 kg)  02/28/22 277 lb 9.6 oz (125.9 kg)      Physical Exam Vitals reviewed.  Constitutional:      Appearance: He is well-developed.  Eyes:     Pupils: Pupils are equal, round, and reactive to light.  Neck:     Thyroid: No thyromegaly.  Cardiovascular:     Rate and Rhythm: Normal rate and regular rhythm.  Pulmonary:     Effort: Pulmonary effort is normal. No respiratory distress.     Breath sounds: Normal breath sounds. No wheezing or rales.  Musculoskeletal:     Cervical back: Neck supple.     Right lower leg: No edema.     Left lower leg: No edema.  Neurological:     Mental Status: He is alert and oriented to person, place, and time.      No results found for any visits on 08/12/23.    The 10-year ASCVD risk score (Arnett DK, et al., 2019) is: 30%    Assessment & Plan:   #1 resistant hypertension improved with recent addition of hydralazine  to multidrug regimen as above.  Continue low-sodium diet.  Continue weight loss efforts.  Continue exercise as tolerated.  Continue current regimen.  #2 type 2 diabetes slightly improved recently with A1c 7.4%.  He has been reluctant to add additional medications.  Cannot take DPP 4 or GLP-1 medication because of his pancreatitis history.  Reassess A1c at 67-month follow-up  #3 hyperlipidemia.  Patient with atorvastatin  20 mg daily.  Recheck lipid and hepatic panel at 53-month follow-up  #4 severe spinal stenosis followed by neurosurgery.  Patient reportedly had repeat MRI recently which showed progression of his disease.  Continue follow-up with chronic pain management  Glean Lamy, MD

## 2023-08-30 ENCOUNTER — Other Ambulatory Visit: Payer: Self-pay | Admitting: Family Medicine

## 2023-09-03 ENCOUNTER — Other Ambulatory Visit: Payer: Self-pay | Admitting: Family Medicine

## 2023-09-18 ENCOUNTER — Other Ambulatory Visit: Payer: Self-pay | Admitting: Family Medicine

## 2023-09-22 ENCOUNTER — Other Ambulatory Visit: Payer: Self-pay | Admitting: Family Medicine

## 2023-09-22 DIAGNOSIS — E119 Type 2 diabetes mellitus without complications: Secondary | ICD-10-CM

## 2023-09-25 ENCOUNTER — Other Ambulatory Visit: Payer: Self-pay | Admitting: Family Medicine

## 2023-10-01 ENCOUNTER — Other Ambulatory Visit: Payer: Self-pay | Admitting: Family Medicine

## 2023-10-01 DIAGNOSIS — G894 Chronic pain syndrome: Secondary | ICD-10-CM | POA: Diagnosis not present

## 2023-10-01 DIAGNOSIS — M961 Postlaminectomy syndrome, not elsewhere classified: Secondary | ICD-10-CM | POA: Diagnosis not present

## 2023-10-01 DIAGNOSIS — Z79891 Long term (current) use of opiate analgesic: Secondary | ICD-10-CM | POA: Diagnosis not present

## 2023-10-01 DIAGNOSIS — M25552 Pain in left hip: Secondary | ICD-10-CM | POA: Diagnosis not present

## 2023-10-09 ENCOUNTER — Telehealth: Payer: Self-pay | Admitting: Family Medicine

## 2023-10-09 NOTE — Telephone Encounter (Signed)
LVM for pt to reschedule provider out of office

## 2023-11-20 ENCOUNTER — Other Ambulatory Visit: Payer: Self-pay | Admitting: Family Medicine

## 2023-11-24 ENCOUNTER — Other Ambulatory Visit: Payer: Self-pay | Admitting: Family Medicine

## 2023-11-24 NOTE — Telephone Encounter (Signed)
 Copied from CRM [id number]. Topic: Clinical - Medication Refill >> Nov 24, 2023  9:29 AM Viola F wrote: Medication: atorvastatin  (LIPITOR) 20 MG tablet [[id number]]  Has the patient contacted their pharmacy? Yes (Agent: If no, request that the patient contact the pharmacy for the refill. If patient does not wish to contact the pharmacy document the reason why and proceed with request.) (Agent: If yes, when and what did the pharmacy advise?)  This is the patient's preferred pharmacy:  [id number] [place], [place] - [address] AT [facility] [address] [facility] [address] [place] [address] Phone: [phone] Fax: [phone]  Is this the correct pharmacy for this prescription? Yes If no, delete pharmacy and type the correct one.   Has the prescription been filled recently? Yes  Is the patient out of the medication? No  Has the patient been seen for an appointment in the last year OR does the patient have an upcoming appointment? Yes  Can we respond through MyChart? Yes  Agent: Please be advised that Rx refills may take up to 3 business days. We ask that you follow-up with your pharmacy.

## 2023-11-25 ENCOUNTER — Other Ambulatory Visit: Payer: Self-pay | Admitting: Family Medicine

## 2023-11-26 DIAGNOSIS — M961 Postlaminectomy syndrome, not elsewhere classified: Secondary | ICD-10-CM | POA: Diagnosis not present

## 2023-11-26 DIAGNOSIS — G894 Chronic pain syndrome: Secondary | ICD-10-CM | POA: Diagnosis not present

## 2023-11-26 DIAGNOSIS — M25552 Pain in left hip: Secondary | ICD-10-CM | POA: Diagnosis not present

## 2023-11-26 DIAGNOSIS — Z79891 Long term (current) use of opiate analgesic: Secondary | ICD-10-CM | POA: Diagnosis not present

## 2023-12-10 NOTE — Progress Notes (Signed)
   12/10/2023  Patient ID: [name]., male   DOB: [birth year], 73 y.o.   MRN: [id number]  Pharmacy Quality Measure Review  This patient is appearing on a report for being at risk of failing the adherence measure for cholesterol (statin) medications this calendar year.   Medication: Atorvastatin  Last fill date: 11/20/23 for 90 day supply  Insurance report was not up to date. No action needed at this time.   Jon VEAR Lindau, PharmD Clinical Pharmacist [phone]

## 2023-12-14 ENCOUNTER — Ambulatory Visit: Admitting: Family Medicine

## 2023-12-23 ENCOUNTER — Ambulatory Visit: Admitting: Family Medicine

## 2023-12-27 ENCOUNTER — Other Ambulatory Visit: Payer: Self-pay | Admitting: Family Medicine

## 2024-01-04 ENCOUNTER — Other Ambulatory Visit: Payer: Self-pay | Admitting: Family Medicine

## 2024-01-04 DIAGNOSIS — E1165 Type 2 diabetes mellitus with hyperglycemia: Secondary | ICD-10-CM

## 2024-01-09 ENCOUNTER — Other Ambulatory Visit: Payer: Self-pay | Admitting: Family Medicine

## 2024-01-09 DIAGNOSIS — E119 Type 2 diabetes mellitus without complications: Secondary | ICD-10-CM

## 2024-01-12 ENCOUNTER — Other Ambulatory Visit: Payer: Self-pay | Admitting: Family Medicine

## 2024-01-22 NOTE — Care Management CC44 (Deleted)
 A user error has taken place: encounter opened in error, closed for administrative reasons.

## 2024-01-22 NOTE — Chronic Care Management (AMB) (Signed)
 A user error has taken place: encounter opened in error, closed for administrative reasons.

## 2024-01-26 ENCOUNTER — Ambulatory Visit (INDEPENDENT_AMBULATORY_CARE_PROVIDER_SITE_OTHER): Admitting: Family Medicine

## 2024-01-26 ENCOUNTER — Ambulatory Visit: Payer: Self-pay | Admitting: Family Medicine

## 2024-01-26 ENCOUNTER — Encounter: Payer: Self-pay | Admitting: Family Medicine

## 2024-01-26 VITALS — BP 136/60 | HR 79 | Temp 97.5°F | Wt 303.7 lb

## 2024-01-26 DIAGNOSIS — Z23 Encounter for immunization: Secondary | ICD-10-CM

## 2024-01-26 DIAGNOSIS — I10 Essential (primary) hypertension: Secondary | ICD-10-CM

## 2024-01-26 DIAGNOSIS — E119 Type 2 diabetes mellitus without complications: Secondary | ICD-10-CM

## 2024-01-26 DIAGNOSIS — E785 Hyperlipidemia, unspecified: Secondary | ICD-10-CM | POA: Diagnosis not present

## 2024-01-26 DIAGNOSIS — R42 Dizziness and giddiness: Secondary | ICD-10-CM | POA: Diagnosis not present

## 2024-01-26 LAB — COMPREHENSIVE METABOLIC PANEL WITH GFR
ALT: 30 U/L (ref 0–53)
AST: 18 U/L (ref 0–37)
Albumin: 4.5 g/dL (ref 3.5–5.2)
Alkaline Phosphatase: 83 U/L (ref 39–117)
BUN: 22 mg/dL (ref 6–23)
CO2: 22 meq/L (ref 19–32)
Calcium: 9.3 mg/dL (ref 8.4–10.5)
Chloride: 102 meq/L (ref 96–112)
Creatinine, Ser: 1.07 mg/dL (ref 0.40–1.50)
GFR: 70.04 mL/min (ref >60.00)
Glucose, Bld: 298 mg/dL — ABNORMAL HIGH (ref 70–99)
Potassium: 4.8 meq/L (ref 3.5–5.1)
Sodium: 135 meq/L (ref 135–145)
Total Bilirubin: 0.6 mg/dL (ref 0.2–1.2)
Total Protein: 6.7 g/dL (ref 6.0–8.3)

## 2024-01-26 LAB — POCT GLYCOSYLATED HEMOGLOBIN (HGB A1C): Hemoglobin A1C: 8.2 % — AB (ref 4.0–5.6)

## 2024-01-26 LAB — LIPID PANEL
Cholesterol: 106 mg/dL (ref 0–200)
HDL: 42.9 mg/dL (ref >39.00)
LDL Cholesterol: 38 mg/dL (ref 0–99)
NonHDL: 62.77
Total CHOL/HDL Ratio: 2
Triglycerides: 124 mg/dL (ref 0.0–149.0)
VLDL: 24.8 mg/dL (ref 0.0–40.0)

## 2024-01-26 MED ORDER — AZELASTINE HCL 137 MCG/SPRAY NA SOLN: NASAL | 2 refills | Status: AC

## 2024-01-26 NOTE — Progress Notes (Signed)
 Established Patient Office Visit  Subjective   Patient ID: [name]., male    DOB: 1950-09-25  Age: 73 y.o. MRN: [id number]  Chief Complaint  Patient presents with   Medical Management of Chronic Issues    HPI   Sal is seen for routine medical follow-up.  He has history of hypertension, type 2 diabetes, hyperlipidemia, chronic back pain.  He is followed by pain management.  He had progressive issues with lumbar stenosis and progressive osteoarthritis left hip which are very limiting in terms of his activities.  He enjoys gardening but has had difficulties because of his back and hip issues.  Last Wednesday he woke up around 5 AM with severe vertigo with associated nausea and vomiting.  This lasted for several hours and did eventually improve after taking some meclizine.  Symptoms of essentially resolved at this time.  Diabetes have been suboptimally controlled.  Takes regimen of Lantus , NovoLog , metformin , Actos .  His last A1c was 7.4%.  He states he has been very poorly compliant with activity and diet recently.  Has been very limited in what he can do physically because of back issues.  He takes atorvastatin  20 mg daily for hyperlipidemia and needs follow-up lipid panel.  Blood pressure treated with several medications including amlodipine , Aldactone , lisinopril , hydralazine  and stable.  Past Medical History:  Diagnosis Date   Arthritis    oa--and ra.  s/p right knee replacement on Jan 28, 2011--plans left knee replacement on 03/04/11.  also severe lower back pain-previous lumbar fusions, also pain in both hips, shoulders    Aspiration pneumonia (HCC)    Asthma    Diabetes mellitus    Gallstone pancreatitis    GERD (gastroesophageal reflux disease)    Hepatitis    pancreatitis 2010   Hyperlipidemia    Hypertension    eccho 6/10, EKG 1/11, clearence Dr Micheal from 11/28/10 on chart   Peripheral vascular disease    states after arthroscopy left and right was  given blood thinners- had symptoms of blood clot but wasnt diagnosed   Pneumonia    2010   Sleep apnea    study years ago/ no cpap/ states mild   Past Surgical History:  Procedure Laterality Date   BACK SURGERY     hx of multiple back surgeries including lumbar fusion S1-L3   BIOPSY  12/04/2018   Procedure: BIOPSY;  Surgeon: Leigh Elspeth SQUIBB, MD;  Location: [facility];  Service: Gastroenterology;;   CHOLECYSTECTOMY     COLON SURGERY     removal of part of small intestine 1979   ELBOW SURGERY     3 tendon repairs on right; 5 tendon repairs on left   ESOPHAGOGASTRODUODENOSCOPY (EGD) WITH PROPOFOL  N/A 12/04/2018   Procedure: ESOPHAGOGASTRODUODENOSCOPY (EGD) WITH PROPOFOL ;  Surgeon: Leigh Elspeth SQUIBB, MD;  Location: [facility];  Service: Gastroenterology;  Laterality: N/A;   SHOULDER SURGERY     left x 2, right x 3   TOTAL KNEE ARTHROPLASTY  01/28/2011   Procedure: TOTAL KNEE ARTHROPLASTY;  Surgeon: Donnice JONETTA Car;  Location: [facility];  Service: Orthopedics;  Laterality: Right;  femoral nerve block preop.   TOTAL KNEE ARTHROPLASTY  03/04/2011   Procedure: TOTAL KNEE ARTHROPLASTY;  Surgeon: Donnice JONETTA Car;  Location: [facility];  Service: Orthopedics;  Laterality: Left;    reports that he has never smoked. He quit smokeless tobacco use about 33 years ago.  His smokeless tobacco use included snuff. He reports that he does not drink alcohol  and does not use drugs. family history is not on file. Allergies  Allergen Reactions   Cortisone Itching    Hives and causes blood sugar to elevate   Jardiance  [Empagliflozin ] Other (See Comments)    Yeast balanitis    Review of Systems  Eyes:  Negative for blurred vision.  Respiratory:  Negative for shortness of breath.   Cardiovascular:  Negative for chest pain.  Musculoskeletal:  Positive for back pain.  Neurological:  Negative for weakness and headaches.      Objective:     BP 136/60   Pulse 79   Temp (!) 97.5 F (36.4 C) (Oral)    Wt (!) 303 lb 11.2 oz (137.8 kg)   SpO2 98%   BMI 36.97 kg/m    Physical Exam Vitals reviewed.  Constitutional:      General: He is not in acute distress.    Appearance: He is well-developed. He is not ill-appearing.  HENT:     Right Ear: External ear normal.     Left Ear: External ear normal.  Eyes:     Pupils: Pupils are equal, round, and reactive to light.  Neck:     Thyroid: No thyromegaly.  Cardiovascular:     Rate and Rhythm: Normal rate and regular rhythm.  Pulmonary:     Effort: Pulmonary effort is normal. No respiratory distress.     Breath sounds: Normal breath sounds. No wheezing or rales.  Musculoskeletal:     Cervical back: Neck supple.     Right lower leg: No edema.     Left lower leg: No edema.  Neurological:     General: No focal deficit present.     Mental Status: He is alert and oriented to person, place, and time.     Cranial Nerves: No cranial nerve deficit.      Results for orders placed or performed in visit on 01/26/24  POC HgB A1c  Result Value Ref Range   Hemoglobin A1C 8.2 (A) 4.0 - 5.6 %   HbA1c POC (<> result, manual entry)     HbA1c, POC (prediabetic range)     HbA1c, POC (controlled diabetic range)      Last CBC Lab Results  Component Value Date   WBC 13.4 (H) 12/04/2018   HGB 12.6 (L) 12/04/2018   HCT 36.2 (L) 12/04/2018   MCV 86.4 12/04/2018   MCH 30.1 12/04/2018   RDW 11.5 12/04/2018   PLT 404 (H) 12/04/2018   Last metabolic panel Lab Results  Component Value Date   GLUCOSE 295 (H) 02/06/2023   NA 136 02/06/2023   K 4.4 02/06/2023   CL 100 02/06/2023   CO2 28 02/06/2023   BUN 15 02/06/2023   CREATININE 1.15 02/06/2023   GFR 64.68 02/06/2023   CALCIUM  9.3 02/06/2023   PHOS 4.2 09/07/2008   PROT 7.2 02/06/2023   ALBUMIN 4.7 02/06/2023   BILITOT 1.2 02/06/2023   ALKPHOS 80 02/06/2023   AST 18 02/06/2023   ALT 29 02/06/2023   ANIONGAP 14 12/05/2018   Last lipids Lab Results  Component Value Date   CHOL 143  02/06/2023   HDL 47.20 02/06/2023   LDLCALC 54 02/06/2023   LDLDIRECT 102.0 01/27/2018   TRIG 208.0 (H) 02/06/2023   CHOLHDL 3 02/06/2023   Last hemoglobin A1c Lab Results  Component Value Date   HGBA1C 8.2 (A) 01/26/2024   Last thyroid functions Lab Results  Component Value Date   TSH 4.64 05/30/2010  The 10-year ASCVD risk score (Arnett DK, et al., 2019) is: 37.5%    Assessment & Plan:   #1 type 2 diabetes suboptimally controlled with A1c 8.2%.  We discussed other potential options for tighter diabetes control and he declines further medication.  He is not a candidate for GLP-1 medications because of his prior history of pancreatitis.  He had severe case of yeast balanitis with Jardiance .  He prefers giving this 3 months of lifestyle management since he has brought this down lower 7 range previously.  Reassess in 3 months.  Check urine microalbumin screen today  #2 hyperlipidemia treated with statin.  Recheck lipid and CMP  #3 hypertension currently stable on multidrug regimen as above.  Continue low-sodium diet.  #4 recent transient vertigo resolved at this time.  Follow-up for any recurrence   Wolm Scarlet, MD

## 2024-01-26 NOTE — Patient Instructions (Signed)
 A1C 8.2 (goal < 7)  Let's plan on 3 month follow up.

## 2024-01-27 LAB — MICROALBUMIN / CREATININE URINE RATIO
Creatinine,U: 59.6 mg/dL
Microalb Creat Ratio: UNDETERMINED mg/g (ref 0.0–30.0)
Microalb, Ur: <0.7 mg/dL

## 2024-01-28 DIAGNOSIS — M25552 Pain in left hip: Secondary | ICD-10-CM | POA: Diagnosis not present

## 2024-01-28 DIAGNOSIS — G894 Chronic pain syndrome: Secondary | ICD-10-CM | POA: Diagnosis not present

## 2024-01-28 DIAGNOSIS — Z79891 Long term (current) use of opiate analgesic: Secondary | ICD-10-CM | POA: Diagnosis not present

## 2024-01-28 DIAGNOSIS — M961 Postlaminectomy syndrome, not elsewhere classified: Secondary | ICD-10-CM | POA: Diagnosis not present

## 2024-02-20 ENCOUNTER — Other Ambulatory Visit: Payer: Self-pay | Admitting: Family Medicine

## 2024-02-23 NOTE — Progress Notes (Signed)
   02/23/2024  Patient ID: Michael KATHEE Dwain Mickey., male   DOB: [birth year], 73 y.o.   MRN: [id number]  Pharmacy Quality Measure Review  This patient is appearing on a report for being at risk of failing the adherence measure for cholesterol (statin) medications this calendar year.   Medication: Atorvastatin  Last fill date: 02/21/24 for 90 day supply  Insurance report was not up to date. No action needed at this time.   Jon VEAR Lindau, PharmD Clinical Pharmacist [phone]

## 2024-03-01 ENCOUNTER — Other Ambulatory Visit: Payer: Self-pay | Admitting: Family Medicine

## 2024-03-11 ENCOUNTER — Other Ambulatory Visit: Payer: Self-pay | Admitting: Family Medicine

## 2024-03-11 DIAGNOSIS — E119 Type 2 diabetes mellitus without complications: Secondary | ICD-10-CM

## 2024-03-14 ENCOUNTER — Other Ambulatory Visit: Payer: Self-pay | Admitting: Family Medicine

## 2024-03-25 ENCOUNTER — Other Ambulatory Visit: Payer: Self-pay | Admitting: Family Medicine

## 2024-03-31 ENCOUNTER — Other Ambulatory Visit: Payer: Self-pay | Admitting: Family Medicine

## 2024-04-25 ENCOUNTER — Other Ambulatory Visit: Payer: Self-pay | Admitting: Family Medicine

## 2024-04-27 ENCOUNTER — Ambulatory Visit: Admitting: Family Medicine

## 2024-06-07 ENCOUNTER — Ambulatory Visit: Admitting: Family Medicine

## 2024-06-23 ENCOUNTER — Ambulatory Visit
